# Patient Record
Sex: Female | Born: 1945 | Race: White | Hispanic: No | Marital: Single | Smoking: Former smoker
Health system: Southern US, Community
[De-identification: ages and names within clinical notes are randomized; demographics above are authoritative.]

## PROBLEM LIST (undated history)

## (undated) DIAGNOSIS — Z7189 Other specified counseling: Secondary | ICD-10-CM

## (undated) DIAGNOSIS — J449 Chronic obstructive pulmonary disease, unspecified: Secondary | ICD-10-CM

## (undated) DIAGNOSIS — M549 Dorsalgia, unspecified: Secondary | ICD-10-CM

## (undated) DIAGNOSIS — C801 Malignant (primary) neoplasm, unspecified: Secondary | ICD-10-CM

## (undated) DIAGNOSIS — Z5111 Encounter for antineoplastic chemotherapy: Secondary | ICD-10-CM

## (undated) DIAGNOSIS — Z9981 Dependence on supplemental oxygen: Secondary | ICD-10-CM

## (undated) HISTORY — DX: Other specified counseling: Z71.89

## (undated) HISTORY — PX: BACK SURGERY: SHX140

## (undated) HISTORY — DX: Dorsalgia, unspecified: M54.9

## (undated) HISTORY — DX: Encounter for antineoplastic chemotherapy: Z51.11

## (undated) HISTORY — PX: BREAST SURGERY: SHX581

---

## 1988-11-04 HISTORY — PX: ABDOMINAL HYSTERECTOMY: SHX81

## 2011-03-19 ENCOUNTER — Ambulatory Visit
Admission: RE | Admit: 2011-03-19 | Discharge: 2011-03-19 | Disposition: A | Discharge status: Home / Self Care | Payer: Medicare Other | Source: Ambulatory Visit | Attending: Orthopedic Surgery | Admitting: Orthopedic Surgery

## 2011-03-19 ENCOUNTER — Other Ambulatory Visit: Payer: Self-pay | Admitting: Orthopedic Surgery

## 2011-03-19 DIAGNOSIS — M25551 Pain in right hip: Secondary | ICD-10-CM

## 2011-04-03 ENCOUNTER — Other Ambulatory Visit: Payer: Self-pay | Admitting: Orthopedic Surgery

## 2011-04-03 DIAGNOSIS — M79604 Pain in right leg: Secondary | ICD-10-CM

## 2011-04-04 ENCOUNTER — Ambulatory Visit
Admission: RE | Admit: 2011-04-04 | Discharge: 2011-04-04 | Disposition: A | Discharge status: Home / Self Care | Payer: Medicare Other | Source: Ambulatory Visit | Attending: Orthopedic Surgery | Admitting: Orthopedic Surgery

## 2011-04-04 DIAGNOSIS — M79604 Pain in right leg: Secondary | ICD-10-CM

## 2011-04-30 ENCOUNTER — Other Ambulatory Visit: Payer: Self-pay | Admitting: Orthopedic Surgery

## 2011-04-30 ENCOUNTER — Ambulatory Visit (HOSPITAL_COMMUNITY)
Admission: RE | Admit: 2011-04-30 | Discharge: 2011-04-30 | Disposition: A | Discharge status: Home / Self Care | Payer: Medicare Other | Source: Ambulatory Visit | Attending: Orthopedic Surgery | Admitting: Orthopedic Surgery

## 2011-04-30 ENCOUNTER — Encounter (HOSPITAL_COMMUNITY): Payer: Medicare Other

## 2011-04-30 ENCOUNTER — Other Ambulatory Visit (HOSPITAL_COMMUNITY): Payer: Self-pay | Admitting: Orthopedic Surgery

## 2011-04-30 DIAGNOSIS — M51379 Other intervertebral disc degeneration, lumbosacral region without mention of lumbar back pain or lower extremity pain: Secondary | ICD-10-CM | POA: Insufficient documentation

## 2011-04-30 DIAGNOSIS — J449 Chronic obstructive pulmonary disease, unspecified: Secondary | ICD-10-CM | POA: Insufficient documentation

## 2011-04-30 DIAGNOSIS — M48061 Spinal stenosis, lumbar region without neurogenic claudication: Secondary | ICD-10-CM

## 2011-04-30 DIAGNOSIS — M216X9 Other acquired deformities of unspecified foot: Secondary | ICD-10-CM | POA: Insufficient documentation

## 2011-04-30 DIAGNOSIS — M79609 Pain in unspecified limb: Secondary | ICD-10-CM | POA: Insufficient documentation

## 2011-04-30 DIAGNOSIS — Z01818 Encounter for other preprocedural examination: Secondary | ICD-10-CM | POA: Insufficient documentation

## 2011-04-30 DIAGNOSIS — M545 Low back pain, unspecified: Secondary | ICD-10-CM | POA: Insufficient documentation

## 2011-04-30 DIAGNOSIS — I10 Essential (primary) hypertension: Secondary | ICD-10-CM | POA: Insufficient documentation

## 2011-04-30 DIAGNOSIS — Z01812 Encounter for preprocedural laboratory examination: Secondary | ICD-10-CM | POA: Insufficient documentation

## 2011-04-30 DIAGNOSIS — E669 Obesity, unspecified: Secondary | ICD-10-CM | POA: Insufficient documentation

## 2011-04-30 DIAGNOSIS — J4489 Other specified chronic obstructive pulmonary disease: Secondary | ICD-10-CM | POA: Insufficient documentation

## 2011-04-30 DIAGNOSIS — M4120 Other idiopathic scoliosis, site unspecified: Secondary | ICD-10-CM | POA: Insufficient documentation

## 2011-04-30 DIAGNOSIS — M5137 Other intervertebral disc degeneration, lumbosacral region: Secondary | ICD-10-CM | POA: Insufficient documentation

## 2011-04-30 LAB — DIFFERENTIAL
Basophils Absolute: 0 10*3/uL (ref 0.0–0.1)
Basophils Relative: 0 % (ref 0–1)
Eosinophils Absolute: 0.1 10*3/uL (ref 0.0–0.7)
Eosinophils Relative: 1 % (ref 0–5)
Neutrophils Relative %: 72 % (ref 43–77)

## 2011-04-30 LAB — PROTIME-INR
INR: 0.93 (ref 0.00–1.49)
Prothrombin Time: 12.7 seconds (ref 11.6–15.2)

## 2011-04-30 LAB — COMPREHENSIVE METABOLIC PANEL
AST: 10 U/L (ref 0–37)
Albumin: 3.6 g/dL (ref 3.5–5.2)
CO2: 27 mEq/L (ref 19–32)
Calcium: 10 mg/dL (ref 8.4–10.5)
Creatinine, Ser: 0.62 mg/dL (ref 0.50–1.10)
GFR calc non Af Amer: >60 mL/min (ref >60)
Sodium: 140 mEq/L (ref 135–145)
Total Protein: 7.1 g/dL (ref 6.0–8.3)

## 2011-04-30 LAB — URINALYSIS, ROUTINE W REFLEX MICROSCOPIC
Glucose, UA: NEGATIVE mg/dL
Ketones, ur: NEGATIVE mg/dL
Leukocytes, UA: NEGATIVE
Nitrite: NEGATIVE
Protein, ur: NEGATIVE mg/dL

## 2011-04-30 LAB — SURGICAL PCR SCREEN: Staphylococcus aureus: NEGATIVE

## 2011-04-30 LAB — CBC
Platelets: 327 10*3/uL (ref 150–400)
RBC: 5.54 MIL/uL — ABNORMAL HIGH (ref 3.87–5.11)
RDW: 14.2 % (ref 11.5–15.5)
WBC: 11.9 10*3/uL — ABNORMAL HIGH (ref 4.0–10.5)

## 2011-04-30 LAB — APTT: aPTT: 34 seconds (ref 24–37)

## 2011-04-30 LAB — ABO/RH: ABO/RH(D): O POS

## 2011-05-07 ENCOUNTER — Ambulatory Visit (HOSPITAL_COMMUNITY): Payer: Medicare Other

## 2011-05-07 ENCOUNTER — Inpatient Hospital Stay (HOSPITAL_COMMUNITY)
Admission: RE | Admit: 2011-05-07 | Discharge: 2011-05-11 | DRG: 491 | Disposition: A | Discharge status: Home Health | Payer: Medicare Other | Source: Ambulatory Visit | Attending: Orthopedic Surgery | Admitting: Orthopedic Surgery

## 2011-05-07 DIAGNOSIS — I10 Essential (primary) hypertension: Secondary | ICD-10-CM | POA: Diagnosis present

## 2011-05-07 DIAGNOSIS — Z01812 Encounter for preprocedural laboratory examination: Secondary | ICD-10-CM

## 2011-05-07 DIAGNOSIS — M48061 Spinal stenosis, lumbar region without neurogenic claudication: Principal | ICD-10-CM | POA: Diagnosis present

## 2011-05-07 DIAGNOSIS — M216X9 Other acquired deformities of unspecified foot: Secondary | ICD-10-CM | POA: Diagnosis present

## 2011-05-07 DIAGNOSIS — E669 Obesity, unspecified: Secondary | ICD-10-CM | POA: Diagnosis present

## 2011-05-07 DIAGNOSIS — G510 Bell's palsy: Secondary | ICD-10-CM | POA: Diagnosis present

## 2011-05-07 DIAGNOSIS — J449 Chronic obstructive pulmonary disease, unspecified: Secondary | ICD-10-CM | POA: Diagnosis present

## 2011-05-07 DIAGNOSIS — J4489 Other specified chronic obstructive pulmonary disease: Secondary | ICD-10-CM | POA: Diagnosis present

## 2011-05-07 LAB — TYPE AND SCREEN: ABO/RH(D): O POS

## 2011-05-09 LAB — HEMOGLOBIN AND HEMATOCRIT, BLOOD: HCT: 38.3 % (ref 36.0–46.0)

## 2011-05-11 NOTE — Op Note (Signed)
[name], [name]                 ACCOUNT NO.:  [id number]  MEDICAL RECORD NO.:  [id number]  LOCATION:  1615                         FACILITY:  [facility]  PHYSICIAN:  Georges Lynch. Jawanna Dykman, M.D.DATE OF BIRTH:  April 25, 1946  DATE OF PROCEDURE:  05/07/2011 DATE OF DISCHARGE:                              OPERATIVE REPORT   SURGEON:  Georges Lynch. Darrelyn Hillock, M.D.  ASSISTANT:  Marlowe Kays, M.D.  PREOPERATIVE DIAGNOSES: 1. Severe spinal stenosis at L3-4 with foraminal stenosis at L3-4     bilaterally. 2. Partial footdrop on the right.  Note, she had all right leg pain     prior to surgery.  POSTOPERATIVE DIAGNOSES: 1. Severe spinal stenosis at L3-4 with foraminal stenosis at L3-4     bilaterally. 2. Partial footdrop on the right.  Note, she had all right leg pain     prior to surgery.  OPERATION: 1. Complete decompressive lumbar laminectomy at L3-4 for spinal     stenosis. 2. Bilateral foraminotomies at L4 and the L3 roots bilaterally.  PROCEDURE:  Under general anesthesia, routine orthopedic prep and drape of the lower back was carried out.  She had 2 g of IV Ancef preop.  At this time because of the bilateral total knee, she was placed in the prone position on the Wilson frame.  Sterile prep and drape was carried out.  The appropriate time-out was carried out.  Note also I marked the right side of her back as the symptoms were on the right and footdrop was on the right and I did that in the holding area.  After sterile prepping and draping and time-out was carried out, two needles were placed on the back for localization purposes.  X-ray was taken to verify the position.  At this time, we identified the L3-4 space.  An incision was made over the L3-4 area.  Bleeders identified and cauterized.  Note she had a significant amount of adipose tissue.  This was more complex because of her body size.  Following that, the self-retaining retractors were inserted.  Bleeders identified and  cauterized.  I then incised the lumbodorsal fascia and separated the muscle from the lamina of spinous process bilaterally.  Once again good hemostasis was maintained.  We did take a great deal of time to control the small amount of bleeding that she had.  At this time, 2 Kocher clamps were placed on the spinous processes, another x-ray was taken.  Following that, the self-retaining McCullough retractors were inserted.  Once again attention was directed to the small amount of bleeding that we had from the soft tissue.  At this time we then removed the spinous process of L3 and a portion of spinous process distal and proximal and went down and did a nice decompressive lumbar laminectomy and a microscope to highlight that was used.  At this particular time, we then identified the L3-4 space with microscope brought in.  We then began our decompression with our Kerrison rongeurs.  The dura was protected at all times.  Note she had a severe constriction and stenosis at this L3-4 level.  It was all due to marked overgrowth of bone and ligamentum flavum.  We dissected proximally and distally until we were able to easily pass a hockey-stick proximally and distally which indicated that the canal now was wide open.  We then gently retracted the dura with a D'Errico retractor and out laterally and removed the thickened ligamentum flavum.  Once this was done, the dura now was totally free, we were able to easily pass a hockey-stick out the foramen and freed up the root 3 and root 4. Following that, we then thoroughly irrigated out the area and x-ray was taken with instruments in the area that we needed to be.  Following that we then injected 10 cc of FloSeal and held that in place for about 30 seconds or so with a saturated sponge.  At this time, I then removed the FloSeal from the dura and into surrounding soft tissue and bony structures and loosely applied some thrombin-soaked Gelfoam and  closed the wound in layers in usual fashion.  The small distal deep and proximal part of the wound was left open for drainage purposes.  The subcu was closed with 0 Vicryl, skin with metal staples.  A sterile Neosporin dressing was applied.  The patient left the operating room in satisfactory condition.          ______________________________ Georges Lynch Darrelyn Hillock, M.D.     RAG/MEDQ  D:  05/07/2011  T:  05/07/2011  Job:  [id number]  cc:   Ollen Gross, M.D. Fax: [phone]  Georgianne Fick, M.D. Fax: [phone]  Electronically Signed by Ranee Gosselin M.D. on 05/11/2011 08:20:08 AM

## 2011-05-11 NOTE — Discharge Summary (Signed)
  Donna, [name]                 ACCOUNT NO.:  [id number]  MEDICAL RECORD NO.:  [id number]  LOCATION:  1615                         FACILITY:  [facility]  PHYSICIAN:  [name]. [name], M.D.DATE OF BIRTH:  08-22-1946  DATE OF ADMISSION:  05/07/2011 DATE OF DISCHARGE:                              DISCHARGE SUMMARY   [name] was admitted to the hospital, taken to surgery by me on May 07, 2011, at which time I did a complete decompressive lumbar laminectomy at L3-4 for spinal stenosis, re-explored 2 roots, the L4 roots and the L3 roots bilaterally.  She had rather severe spinal stenosis.  Postop day #1 which was May 08, 2011, she was doing well.  She was moving her legs well.  She was gradually weaned off her oxygen.  She remained stable.  Day #2 on May 09, 2011, she was much better but she still was a little reluctant to walk because of the steps that she has to climb at home and I did mention to her we will do step training for her beforeshe leaves.  She has 2 flights of stairs to climb at home.  Otherwise, her dressing was changed and wound looked fine.  On May 10, 2011, she was seen by me.  She is afebrile, moves her legs well.  She is up ambulating much better with a walker and we are planning on discharging her tomorrow.  PERTINENT LABORATORY STUDIES:  Her chest x-ray was fine.  Her back x-ray was all listed in the chart.  EKG was within normal limits.  PERTINENT LAB STUDIES:  The surgical screening or nasal cultures were negative for MRSA, negative for staph.  Her platelet count was 327.  Her white count 11,900, hemoglobin 15, hematocrit 47, normal differential. The sodium 140, potassium 3.8, chloride 101, glucose 101, BUN 9, creatinine 0.62, calcium was 10.  Her SGOT, SGPT were normal.  Alkaline phosphatase 94.  Her INR was 0.93.  Prothrombin time 12.7, PTT was normal at 34.  Urinalysis was negative.  FINAL DISCHARGE DIAGNOSIS:  Severe spinal stenosis as I mentioned  above at L3-4 with foraminal stenosis of the L3 and L4 roots.  Note the she had a partial footdrop prior to surgery on the right and that has been relieved postop.  DISCHARGE CONDITION:  Improved.  DISCHARGE DIET:  She will go on her preop diet.  DISCHARGE MEDICATIONS:  Were all listed in the discharge manager that I typed up.  DISCHARGE INSTRUCTIONS:  She will ambulate with a walker, full weightbearing.  She may take a shower as I explained to her this morning and she will see me in the office in 2 weeks or prior to that if there are any problems.  Further discharge medications were written.  She is going to be on Robaxin 500 mg t.i.d. for spasms.  She will be on Percocet 10/650 one every 4 hours p.r.n. for pain.          ______________________________ [name]. [name], M.D.     RAG/MEDQ  D:  05/10/2011  T:  05/10/2011  Job:  [id number]  Electronically Signed by [name] M.D. on 05/11/2011 08:20:10 AM

## 2013-12-03 ENCOUNTER — Emergency Department (HOSPITAL_COMMUNITY): Payer: Medicare Other

## 2013-12-03 ENCOUNTER — Encounter (HOSPITAL_COMMUNITY): Payer: Self-pay | Admitting: Emergency Medicine

## 2013-12-03 ENCOUNTER — Inpatient Hospital Stay (HOSPITAL_COMMUNITY)
Admission: EM | Admit: 2013-12-03 | Discharge: 2013-12-08 | DRG: 190 | Disposition: A | Discharge status: Home / Self Care | Payer: Medicare Other | Attending: Internal Medicine | Admitting: Internal Medicine

## 2013-12-03 DIAGNOSIS — J449 Chronic obstructive pulmonary disease, unspecified: Secondary | ICD-10-CM | POA: Diagnosis present

## 2013-12-03 DIAGNOSIS — Z6837 Body mass index (BMI) 37.0-37.9, adult: Secondary | ICD-10-CM

## 2013-12-03 DIAGNOSIS — E785 Hyperlipidemia, unspecified: Secondary | ICD-10-CM

## 2013-12-03 DIAGNOSIS — Z713 Dietary counseling and surveillance: Secondary | ICD-10-CM

## 2013-12-03 DIAGNOSIS — B370 Candidal stomatitis: Secondary | ICD-10-CM | POA: Diagnosis present

## 2013-12-03 DIAGNOSIS — J441 Chronic obstructive pulmonary disease with (acute) exacerbation: Principal | ICD-10-CM | POA: Diagnosis present

## 2013-12-03 DIAGNOSIS — R062 Wheezing: Secondary | ICD-10-CM | POA: Diagnosis present

## 2013-12-03 DIAGNOSIS — J9600 Acute respiratory failure, unspecified whether with hypoxia or hypercapnia: Secondary | ICD-10-CM | POA: Diagnosis not present

## 2013-12-03 DIAGNOSIS — F17200 Nicotine dependence, unspecified, uncomplicated: Secondary | ICD-10-CM | POA: Diagnosis present

## 2013-12-03 DIAGNOSIS — M1990 Unspecified osteoarthritis, unspecified site: Secondary | ICD-10-CM

## 2013-12-03 DIAGNOSIS — Z853 Personal history of malignant neoplasm of breast: Secondary | ICD-10-CM

## 2013-12-03 DIAGNOSIS — K219 Gastro-esophageal reflux disease without esophagitis: Secondary | ICD-10-CM | POA: Diagnosis present

## 2013-12-03 DIAGNOSIS — J9601 Acute respiratory failure with hypoxia: Secondary | ICD-10-CM

## 2013-12-03 DIAGNOSIS — E669 Obesity, unspecified: Secondary | ICD-10-CM | POA: Diagnosis present

## 2013-12-03 DIAGNOSIS — I10 Essential (primary) hypertension: Secondary | ICD-10-CM | POA: Diagnosis present

## 2013-12-03 DIAGNOSIS — R7309 Other abnormal glucose: Secondary | ICD-10-CM | POA: Diagnosis present

## 2013-12-03 DIAGNOSIS — Z720 Tobacco use: Secondary | ICD-10-CM | POA: Diagnosis present

## 2013-12-03 HISTORY — DX: Chronic obstructive pulmonary disease, unspecified: J44.9

## 2013-12-03 HISTORY — DX: Malignant (primary) neoplasm, unspecified: C80.1

## 2013-12-03 LAB — CBC
HCT: 52.4 % — ABNORMAL HIGH (ref 36.0–46.0)
HEMOGLOBIN: 16.8 g/dL — AB (ref 12.0–15.0)
MCH: 27.1 pg (ref 26.0–34.0)
MCHC: 32.1 g/dL (ref 30.0–36.0)
MCV: 84.5 fL (ref 78.0–100.0)
PLATELETS: 303 10*3/uL (ref 150–400)
RBC: 6.2 MIL/uL — ABNORMAL HIGH (ref 3.87–5.11)
RDW: 14.9 % (ref 11.5–15.5)
WBC: 9.2 10*3/uL (ref 4.0–10.5)

## 2013-12-03 LAB — BASIC METABOLIC PANEL
BUN: 10 mg/dL (ref 6–23)
CALCIUM: 9.3 mg/dL (ref 8.4–10.5)
CO2: 30 mEq/L (ref 19–32)
Chloride: 96 mEq/L (ref 96–112)
Creatinine, Ser: 0.58 mg/dL (ref 0.50–1.10)
Glucose, Bld: 126 mg/dL — ABNORMAL HIGH (ref 70–99)
POTASSIUM: 3.8 meq/L (ref 3.7–5.3)
Sodium: 138 mEq/L (ref 137–147)

## 2013-12-03 LAB — POCT I-STAT TROPONIN I: TROPONIN I, POC: 0 ng/mL (ref 0.00–0.08)

## 2013-12-03 LAB — HEMOGLOBIN A1C
Hgb A1c MFr Bld: 6.4 % — ABNORMAL HIGH (ref <5.7)
Mean Plasma Glucose: 137 mg/dL — ABNORMAL HIGH (ref <117)

## 2013-12-03 LAB — PRO B NATRIURETIC PEPTIDE: PRO B NATRI PEPTIDE: 55.2 pg/mL (ref 0–125)

## 2013-12-03 MED ORDER — SODIUM CHLORIDE 0.9 % IV BOLUS (SEPSIS)
500.0000 mL | Freq: Once | INTRAVENOUS | Status: AC
  Administered 2013-12-03: 500 mL via INTRAVENOUS

## 2013-12-03 MED ORDER — ALBUTEROL SULFATE (2.5 MG/3ML) 0.083% IN NEBU: 2.5000 mg | INHALATION_SOLUTION | RESPIRATORY_TRACT | Status: AC | PRN

## 2013-12-03 MED ORDER — OXYCODONE-ACETAMINOPHEN 10-325 MG PO TABS: 1.0000 | ORAL_TABLET | Freq: Four times a day (QID) | ORAL | Status: DC | PRN

## 2013-12-03 MED ORDER — CELECOXIB 200 MG PO CAPS
200.0000 mg | ORAL_CAPSULE | Freq: Two times a day (BID) | ORAL | Status: DC | PRN
  Filled 2013-12-03: qty 1

## 2013-12-03 MED ORDER — OSELTAMIVIR PHOSPHATE 75 MG PO CAPS
75.0000 mg | ORAL_CAPSULE | Freq: Two times a day (BID) | ORAL | Status: DC
  Administered 2013-12-03 – 2013-12-04 (×2): 75 mg via ORAL
  Filled 2013-12-03 (×3): qty 1

## 2013-12-03 MED ORDER — METHYLPREDNISOLONE SODIUM SUCC 125 MG IJ SOLR
125.0000 mg | Freq: Two times a day (BID) | INTRAMUSCULAR | Status: DC
  Filled 2013-12-03: qty 2

## 2013-12-03 MED ORDER — ALBUTEROL (5 MG/ML) CONTINUOUS INHALATION SOLN
10.0000 mg/h | INHALATION_SOLUTION | Freq: Once | RESPIRATORY_TRACT | Status: AC
  Administered 2013-12-03: 10 mg/h via RESPIRATORY_TRACT
  Filled 2013-12-03: qty 20

## 2013-12-03 MED ORDER — ENOXAPARIN SODIUM 40 MG/0.4ML ~~LOC~~ SOLN
40.0000 mg | SUBCUTANEOUS | Status: DC
  Administered 2013-12-03 – 2013-12-07 (×5): 40 mg via SUBCUTANEOUS
  Filled 2013-12-03 (×6): qty 0.4

## 2013-12-03 MED ORDER — NICOTINE 21 MG/24HR TD PT24
21.0000 mg | MEDICATED_PATCH | Freq: Every day | TRANSDERMAL | Status: DC
Start: 2013-12-03 — End: 2013-12-08
  Administered 2013-12-04 – 2013-12-08 (×5): 21 mg via TRANSDERMAL
  Filled 2013-12-03 (×5): qty 1

## 2013-12-03 MED ORDER — PREGABALIN 75 MG PO CAPS
200.0000 mg | ORAL_CAPSULE | Freq: Two times a day (BID) | ORAL | Status: DC
  Administered 2013-12-03 – 2013-12-08 (×9): 200 mg via ORAL
  Filled 2013-12-03 (×2): qty 2
  Filled 2013-12-03 (×6): qty 1
  Filled 2013-12-03 (×2): qty 2
  Filled 2013-12-03: qty 1
  Filled 2013-12-03 (×2): qty 2
  Filled 2013-12-03: qty 1
  Filled 2013-12-03 (×2): qty 2
  Filled 2013-12-03: qty 1
  Filled 2013-12-03 (×2): qty 2
  Filled 2013-12-03: qty 1

## 2013-12-03 MED ORDER — IPRATROPIUM BROMIDE 0.02 % IN SOLN
0.5000 mg | Freq: Once | RESPIRATORY_TRACT | Status: AC
  Administered 2013-12-03: 0.5 mg via RESPIRATORY_TRACT
  Filled 2013-12-03: qty 2.5

## 2013-12-03 MED ORDER — ATORVASTATIN CALCIUM 40 MG PO TABS
40.0000 mg | ORAL_TABLET | Freq: Every day | ORAL | Status: DC
  Administered 2013-12-03 – 2013-12-07 (×5): 40 mg via ORAL
  Filled 2013-12-03 (×6): qty 1

## 2013-12-03 MED ORDER — LEVOFLOXACIN IN D5W 500 MG/100ML IV SOLN: 500.0000 mg | INTRAVENOUS | Status: DC

## 2013-12-03 MED ORDER — FLUTICASONE PROPIONATE 50 MCG/ACT NA SUSP
2.0000 | Freq: Every day | NASAL | Status: DC
  Administered 2013-12-03 – 2013-12-08 (×6): 2 via NASAL
  Filled 2013-12-03: qty 16

## 2013-12-03 MED ORDER — LEVOFLOXACIN IN D5W 500 MG/100ML IV SOLN
500.0000 mg | INTRAVENOUS | Status: DC
Start: 2013-12-03 — End: 2013-12-07
  Administered 2013-12-03 – 2013-12-06 (×4): 500 mg via INTRAVENOUS
  Filled 2013-12-03 (×4): qty 100

## 2013-12-03 MED ORDER — OXYCODONE HCL 5 MG PO TABS: 5.0000 mg | ORAL_TABLET | Freq: Four times a day (QID) | ORAL | Status: DC | PRN

## 2013-12-03 MED ORDER — IPRATROPIUM-ALBUTEROL 0.5-2.5 (3) MG/3ML IN SOLN
3.0000 mL | RESPIRATORY_TRACT | Status: DC
Start: 2013-12-03 — End: 2013-12-04
  Administered 2013-12-03 – 2013-12-04 (×2): 3 mL via RESPIRATORY_TRACT
  Filled 2013-12-03 (×2): qty 3

## 2013-12-03 MED ORDER — METHYLPREDNISOLONE SODIUM SUCC 125 MG IJ SOLR
125.0000 mg | Freq: Two times a day (BID) | INTRAMUSCULAR | Status: DC
  Administered 2013-12-04 – 2013-12-08 (×8): 125 mg via INTRAVENOUS
  Filled 2013-12-03 (×10): qty 2

## 2013-12-03 MED ORDER — METHYLPREDNISOLONE SODIUM SUCC 125 MG IJ SOLR
125.0000 mg | Freq: Once | INTRAMUSCULAR | Status: AC
  Administered 2013-12-03: 125 mg via INTRAVENOUS
  Filled 2013-12-03: qty 2

## 2013-12-03 MED ORDER — CYCLOBENZAPRINE HCL 10 MG PO TABS
10.0000 mg | ORAL_TABLET | Freq: Two times a day (BID) | ORAL | Status: DC | PRN
  Filled 2013-12-03: qty 1

## 2013-12-03 MED ORDER — PANTOPRAZOLE SODIUM 40 MG PO TBEC
80.0000 mg | DELAYED_RELEASE_TABLET | Freq: Every day | ORAL | Status: DC
  Administered 2013-12-04 – 2013-12-08 (×5): 80 mg via ORAL
  Filled 2013-12-03 (×6): qty 2

## 2013-12-03 MED ORDER — OXYCODONE-ACETAMINOPHEN 5-325 MG PO TABS: 1.0000 | ORAL_TABLET | Freq: Four times a day (QID) | ORAL | Status: DC | PRN

## 2013-12-03 NOTE — Progress Notes (Signed)
   CARE MANAGEMENT ED NOTE 12/03/2013  Patient:  [name], [name]   Account Number:  [id number]  Date Initiated:  12/03/2013  Documentation initiated by:  Jackelyn Poling  Subjective/Objective Assessment:   68 yr old [employer] pt who states dr Merrilee Seashore is pcp     Subjective/Objective Assessment Detail:     Action/Plan:   EPIC updated   Action/Plan Detail:   Anticipated DC Date:       Status Recommendation to Physician:   Result of Recommendation:    Other ED [place]  Other  PCP issues  Other    Choice offered to / List presented to:            Status of service:  Completed, signed off  ED Comments:   ED Comments Detail:

## 2013-12-03 NOTE — H&P (Signed)
History and Physical    [name] HAL:[id number] DOB: Jun 28, 1946 DOA: 12/03/2013  Referring physician: Dr. Tomi Bamberger PCP: Merrilee Seashore, MD  Specialists: none  Chief Complaint: SOB  HPI: [name] is a 68 y.o. female has a past medical history significant for COPD, tobacco abuse (2.5 ppd), HTN, HLD, obesity, presents to the ED with a chief complaint of shortness of breath. She has been having upper respiratory symptoms for the past 3-4 days, and went to see her PCP yesterday. She was given an antibiotic, antiviral, and some medications for symptom management, however overnight she was feeling progressively worse and she decided to come to the emergency room today. She endorses flulike symptoms, but denies frank fever or chills. She endorses productive cough with sputum production, and she has been having a hard time expectorating, states that the sputum feels "like a brick". She denies any abdominal pain, nausea vomiting or diarrhea. She denies any chest pain. She denies any lightheadedness or dizziness. This has never happened to her in the past. She rarely needs prednisone for mild exacerbations of her COPD, and this is usually manages an outpatient and never needed to be hospitalized for this. In the emergency room, patient with oxygen requirements and difficult to control her wheezing and respiratory symptoms, [facility] has been asked to admit. Blood work was normal renal function, no leukocytosis, chest x-ray with no evidence of pneumonia.  Review of Systems: As per history of present illness, otherwise negative.  Past Medical History  Diagnosis Date  . COPD (chronic obstructive pulmonary disease)   . Cancer     right breast cancer   Past Surgical History  Procedure Laterality Date  . Back surgery     Social History:  reports that she has been smoking Cigarettes.  She has been smoking about 3.00 packs per day. She does not have any smokeless tobacco history on file. She reports that she  does not drink alcohol. Her drug history is not on file.  No Known Allergies  Family history noncontributory  Prior to Admission medications   Medication Sig Start Date End Date Taking? Authorizing Provider  albuterol (PROVENTIL HFA;VENTOLIN HFA) 108 (90 BASE) MCG/ACT inhaler Inhale 2 puffs into the lungs daily as needed for wheezing or shortness of breath.   Yes Historical Provider, MD  atorvastatin (LIPITOR) 40 MG tablet Take 40 mg by mouth daily.   Yes Historical Provider, MD  cefUROXime (CEFTIN) 250 MG tablet Take 250 mg by mouth every 12 (twelve) hours. For 10 days 12/02/13  Yes Historical Provider, MD  celecoxib (CELEBREX) 200 MG capsule Take 200 mg by mouth 2 (two) times daily.   Yes Historical Provider, MD  chlorpheniramine-phenylephrine (RYNATAN) 9-25 MG TABS Take 1 tablet by mouth every 12 (twelve) hours as needed.   Yes Historical Provider, MD  cyclobenzaprine (FLEXERIL) 10 MG tablet Take 10 mg by mouth 2 (two) times daily as needed for muscle spasms.   Yes Historical Provider, MD  esomeprazole (NEXIUM) 40 MG capsule Take 40 mg by mouth daily as needed (acid reflex).   Yes Historical Provider, MD  fluticasone (FLONASE) 50 MCG/ACT nasal spray Place 2 sprays into both nostrils daily.   Yes Historical Provider, MD  Ipratropium-Albuterol (COMBIVENT RESPIMAT) 20-100 MCG/ACT AERS respimat Inhale 1 puff into the lungs 3 (three) times daily.   Yes Historical Provider, MD  irbesartan-hydrochlorothiazide (AVALIDE) 150-12.5 MG per tablet Take 2 tablets by mouth daily. For blood pressure   Yes Historical Provider, MD  oseltamivir (TAMIFLU) 75 MG capsule  Take 75 mg by mouth 2 (two) times daily. For 5 days 12/02/13  Yes Historical Provider, MD  oxyCODONE-acetaminophen (PERCOCET) 10-325 MG per tablet Take 1 tablet by mouth every 6 (six) hours as needed for pain.   Yes Historical Provider, MD  pregabalin (LYRICA) 200 MG capsule Take 200 mg by mouth 2 (two) times daily.   Yes Historical Provider, MD    Physical Exam: Filed Vitals:   12/03/13 1445 12/03/13 1500 12/03/13 1633 12/03/13 1645  BP:   96/43   Pulse: 103 104  101  Temp:      TempSrc:      Resp: 17 21  18   SpO2: 89% 99%  95%     General:  Sitting upright, breathing relatively comfortable, speaking in full sentences.  Eyes: no scleral icterus  ENT: moist oropharynx  Neck: supple, no JVD  Cardiovascular: regular rate without MRG; 2+ peripheral pulses  Respiratory: Decreased breath sounds throughout, diffuse expiratory wheezing present  Abdomen: Obese, soft, non tender to palpation, positive bowel sounds, no guarding, no rebound  Skin: no rashes  Musculoskeletal: no peripheral edema  Psychiatric: normal mood and affect  Neurologic: Grossly nonfocal  Labs on Admission:  Basic Metabolic Panel:  Recent Labs Lab 12/03/13 1415  NA 138  K 3.8  CL 96  CO2 30  GLUCOSE 126*  BUN 10  CREATININE 0.58  CALCIUM 9.3   CBC:  Recent Labs Lab 12/03/13 1415  WBC 9.2  HGB 16.8*  HCT 52.4*  MCV 84.5  PLT 303   BNP (last 3 results)  Recent Labs  12/03/13 1415  PROBNP 55.2   Radiological Exams on Admission: Dg Chest 2 View  12/03/2013   CLINICAL DATA:  Shortness of breath.  Emphysema.  EXAM: CHEST  2 VIEW  COMPARISON:  None.  FINDINGS: The lungs are hyperinflated with flattening of the diaphragm consistent with emphysema. No acute infiltrates or effusions. Heart size and pulmonary vascularity are normal. No significant osseous abnormality.  IMPRESSION: Emphysema.  No acute abnormalities.   Electronically Signed   By: Rozetta Nunnery M.D.   On: 12/03/2013 14:07    EKG: Independently reviewed. Sinus tachycardia.  Assessment/Plan Principal Problem:   COPD exacerbation Active Problems:   HTN (hypertension)   HLD (hyperlipidemia)   Obesity   Tobacco abuse   GERD (gastroesophageal reflux disease)   OA (osteoarthritis)   COPD exacerbation - with productive sputum production, we'll start empiric  levofloxacin. Check influenza and start Tamiflu, discontinued negative. Duonebs every 4 hours as needed, IV steroids.  Tobacco abuse - she unfortunately continues to smoke about 2-1/2 packs a day. We'll provide nicotine patch. Hypertension - hold antihypertensives As blood pressure normal the low side in the ED GERD - continue PPI Hyperlipidemia - continue statin Obesity - no history of diabetes, but check an A1c especially given the fact that she will get steroids. Morning Accu-Cheks. Osteoarthritis - continue home pain regimen.  Diet: regular Fluids: NS DVT Prophylaxis: Lovenox  Code Status: Full  Family Communication: none  Disposition Plan: inpatient  Time spent: 68  Costin M. Cruzita Lederer, MD [facility] Pager [phone]  If 7PM-7AM, please contact night-coverage [email or web address] Password [email or web address] 12/03/2013, 5:06 PM

## 2013-12-03 NOTE — ED Notes (Signed)
Per EMS, pt was seen at [place] office today for followup on bloodwork and her COPD. Pt states she has had shortness of breath since last night. Pt's O2 sats were in the 80s on RA while at doctor. Pt also complains of congestion and wheezing. Pt was given 5mg  albuterol en route to hospital, which pt states has helped. Pt was 94% on 2L during ride to hospital. Pt denies chest pain, nausea, vomiting.

## 2013-12-03 NOTE — ED Notes (Signed)
Bed: WA17 Expected date:  Expected time:  Means of arrival:  Comments: EMS-SOB

## 2013-12-03 NOTE — ED Provider Notes (Signed)
CSN: [id number]     Arrival date & time 12/03/13  1251 History   First MD Initiated Contact with Patient 12/03/13 1309     Chief Complaint  Patient presents with  . Shortness of Breath    HPI Comments: Pt has history of COPD.  She was seen at her doctor's office yesterday for flu like symptoms.  She was started on ceftin, tamiflu, decongest and medications and flonase. Pt started some of the medications and felt much worse.    Patient is a 68 y.o. female presenting with shortness of breath. The history is provided by the patient.  Shortness of Breath Severity:  Severe Onset quality:  Gradual Duration:  2 days Timing:  Constant Progression:  Worsening Chronicity:  Recurrent Context: URI   Relieved by:  Nothing Worsened by:  Activity and exertion Ineffective treatments: inhalers helped to alleviate somewhat but she is still not breathing as easily as normal. Associated symptoms: cough and sputum production   Associated symptoms: no abdominal pain, no chest pain, no fever, no rash and no vomiting    patient went back to her doctor's office today. She was noted to have oxygen saturations in the 80s on room air. The patient was transported by EMS to the hospital. She was given 5 mg of albuterol in route. Patient does continue to smoke cigarettes unfortunately History reviewed. No pertinent past medical history. Past Surgical History  Procedure Laterality Date  . Back surgery     No family history on file. History  Substance Use Topics  . Smoking status: Current Every Day Smoker -- 3.00 packs/day    Types: Cigarettes  . Smokeless tobacco: Not on file  . Alcohol Use: No   OB History   Grav Para Term Preterm Abortions TAB SAB Ect Mult Living                 Review of Systems  Constitutional: Negative for fever.  Respiratory: Positive for cough, sputum production and shortness of breath.   Cardiovascular: Negative for chest pain.  Gastrointestinal: Negative for vomiting and  abdominal pain.  Skin: Negative for rash.  All other systems reviewed and are negative.    Allergies  Review of patient's allergies indicates no known allergies.  Home Medications  No current outpatient prescriptions on file. BP 166/73  Pulse 104  Temp(Src) 98.1 F (36.7 C) (Oral)  Resp 21  SpO2 99% Physical Exam  Nursing note and vitals reviewed. Constitutional: She appears well-developed and well-nourished. No distress.  Obese  HENT:  Head: Normocephalic and atraumatic.  Right Ear: External ear normal.  Left Ear: External ear normal.  Mouth/Throat: No oropharyngeal exudate.  Eyes: Conjunctivae are normal. Right eye exhibits no discharge. Left eye exhibits no discharge. No scleral icterus.  Neck: Neck supple. No tracheal deviation present.  Cardiovascular: Normal rate, regular rhythm and intact distal pulses.   Pulmonary/Chest: Effort normal. No stridor. No respiratory distress. She has decreased breath sounds. She has no wheezes. She has no rales.  No wheezing or rales auscultated but severely diminished breath sounds bilaterally  Abdominal: Soft. Bowel sounds are normal. She exhibits no distension. There is no tenderness. There is no rebound and no guarding.  Musculoskeletal: She exhibits no edema and no tenderness.  Neurological: She is alert. She has normal strength. No cranial nerve deficit (no facial droop, extraocular movements intact, no slurred speech) or sensory deficit. She exhibits normal muscle tone. She displays no seizure activity. Coordination normal.  Skin: Skin is warm and  dry. No rash noted.  Psychiatric: She has a normal mood and affect.    ED Course  Procedures (including critical care time) Labs Review Labs Reviewed  BASIC METABOLIC PANEL - Abnormal; Notable for the following:    Glucose, Bld 126 (*)    All other components within normal limits  CBC - Abnormal; Notable for the following:    RBC 6.20 (*)    Hemoglobin 16.8 (*)    HCT 52.4 (*)     All other components within normal limits  PRO B NATRIURETIC PEPTIDE  POCT I-STAT TROPONIN I   Imaging Review Dg Chest 2 View  12/03/2013   CLINICAL DATA:  Shortness of breath.  Emphysema.  EXAM: CHEST  2 VIEW  COMPARISON:  None.  FINDINGS: The lungs are hyperinflated with flattening of the diaphragm consistent with emphysema. No acute infiltrates or effusions. Heart size and pulmonary vascularity are normal. No significant osseous abnormality.  IMPRESSION: Emphysema.  No acute abnormalities.   Electronically Signed   By: Rozetta Nunnery M.D.   On: 12/03/2013 14:07    EKG Interpretation    Date/Time:  Friday December 03 2013 14:03:13 EST Ventricular Rate:  95 PR Interval:  144 QRS Duration: 100 QT Interval:  377 QTC Calculation: 474 R Axis:   59 Text Interpretation:  Sinus rhythm Low voltage, precordial leads Probable anteroseptal infarct, old No previous tracing Confirmed by Ricardo Kayes  MD-J, [employer] Cahoon ([id number]) on 12/03/2013 2:09:30 PM           Medications  methylPREDNISolone sodium succinate (SOLU-MEDROL) 125 mg/2 mL injection 125 mg (125 mg Intravenous Given 12/03/13 1506)  albuterol (PROVENTIL,VENTOLIN) solution continuous neb (10 mg/hr Nebulization Given 12/03/13 1351)  ipratropium (ATROVENT) nebulizer solution 0.5 mg (0.5 mg Nebulization Given 12/03/13 1351)    MDM   1. COPD with exacerbation    Pt feeling better but still requiring o2 to maintain saturations.  Will consult with hospitalist regarding admission for COPD exacerbation.  No PNA.  Doubt CHF.   Kathalene Frames, MD 12/03/13 [phone]

## 2013-12-03 NOTE — Progress Notes (Signed)
Utilization Review completed.  Skylar Flynt RN CM  

## 2013-12-04 DIAGNOSIS — F17200 Nicotine dependence, unspecified, uncomplicated: Secondary | ICD-10-CM

## 2013-12-04 DIAGNOSIS — I10 Essential (primary) hypertension: Secondary | ICD-10-CM

## 2013-12-04 DIAGNOSIS — K219 Gastro-esophageal reflux disease without esophagitis: Secondary | ICD-10-CM

## 2013-12-04 LAB — INFLUENZA PANEL BY PCR (TYPE A & B)
H1N1 flu by pcr: NOT DETECTED
Influenza A By PCR: NEGATIVE
Influenza B By PCR: NEGATIVE

## 2013-12-04 LAB — GLUCOSE, CAPILLARY: GLUCOSE-CAPILLARY: 131 mg/dL — AB (ref 70–99)

## 2013-12-04 MED ORDER — IPRATROPIUM-ALBUTEROL 0.5-2.5 (3) MG/3ML IN SOLN
3.0000 mL | Freq: Four times a day (QID) | RESPIRATORY_TRACT | Status: DC
Start: 2013-12-04 — End: 2013-12-05
  Administered 2013-12-04 (×4): 3 mL via RESPIRATORY_TRACT
  Filled 2013-12-04 (×4): qty 3

## 2013-12-04 MED ORDER — ROPINIROLE HCL 1 MG PO TABS
1.0000 mg | ORAL_TABLET | Freq: Every evening | ORAL | Status: DC | PRN
  Administered 2013-12-04 – 2013-12-07 (×3): 1 mg via ORAL
  Filled 2013-12-04 (×3): qty 1

## 2013-12-04 MED ORDER — IRBESARTAN 300 MG PO TABS
300.0000 mg | ORAL_TABLET | Freq: Every day | ORAL | Status: DC
  Administered 2013-12-04 – 2013-12-08 (×5): 300 mg via ORAL
  Filled 2013-12-04 (×6): qty 1

## 2013-12-04 MED ORDER — IRBESARTAN-HYDROCHLOROTHIAZIDE 150-12.5 MG PO TABS
2.0000 | ORAL_TABLET | Freq: Every day | ORAL | Status: DC
Start: 2013-12-04 — End: 2013-12-04

## 2013-12-04 MED ORDER — HYDROCHLOROTHIAZIDE 25 MG PO TABS
25.0000 mg | ORAL_TABLET | Freq: Every day | ORAL | Status: DC
  Administered 2013-12-04 – 2013-12-08 (×5): 25 mg via ORAL
  Filled 2013-12-04 (×5): qty 1

## 2013-12-04 NOTE — Progress Notes (Signed)
[facility] PROGRESS NOTE  Wallis Spizzirri FAO:[id number] DOB: July 25, 1946 DOA: 12/03/2013 PCP: Merrilee Seashore, MD  Brief narrative 68 year old obese female with COPD and active heavy smoker, hypertension, hyperlipidemia who presented with acute COPD exacerbation.  Assessment/Plan: Acute COPD exacerbation Continue IV Solu-Medrol, scheduled nebs and O2 via nasal cannula. Monitor O2 sat. Counseled strongly on smoking cessation. Continue empiric Levaquin. Continue empiric  Tamiflu and pending Flu PCR.  Tobacco abuse continues to smoke about 2-1/2 packs a day. Order nicotine patch. Counseled strongly on suggestion  GERD Continue PPI  Hypertension Resume blood pressure medications  Hyperlipidemia Continue statin   Obesity  Check A1c and lipid panel.. Counseled on weight loss  Osteoarthritis Continue home regimen    Diet: Regular DVT prophylaxis  Code Status: Full code Family Communication: None at bedside  Disposition Plan: Home once improved   Consultants:  None  Procedures:  None  Antibiotics:  Levaquin  HPI/Subjective: Patient seen and examined this morning. Reports her breathing to be slightly better but still very wheezy and having cough.  Objective: Filed Vitals:   12/04/13 0629  BP: 152/87  Pulse: 93  Temp: 97.5 F (36.4 C)  Resp: 18   No intake or output data in the 24 hours ending 12/04/13 1030 There were no vitals filed for this visit.  Exam:   General:  Elderly obese female lying in bed in no acute distress  HEENT: No pallor, moist oral mucosa  Chest: Diffuse wheezing bilaterally  CVS: Normal S1 and S2, no murmurs or rubs  Abdomen: Soft, nontender, nondistended, bowel sounds present  Extremities: Warm, no edema  CNS: AAO x3    Data Reviewed: Basic Metabolic Panel:  Recent Labs Lab 12/03/13 1415  NA 138  K 3.8  CL 96  CO2 30  GLUCOSE 126*  BUN 10  CREATININE 0.58  CALCIUM 9.3   Liver Function Tests: No  results found for this basename: AST, ALT, ALKPHOS, BILITOT, PROT, ALBUMIN,  in the last 168 hours No results found for this basename: LIPASE, AMYLASE,  in the last 168 hours No results found for this basename: AMMONIA,  in the last 168 hours CBC:  Recent Labs Lab 12/03/13 1415  WBC 9.2  HGB 16.8*  HCT 52.4*  MCV 84.5  PLT 303   Cardiac Enzymes: No results found for this basename: CKTOTAL, CKMB, CKMBINDEX, TROPONINI,  in the last 168 hours BNP (last 3 results)  Recent Labs  12/03/13 1415  PROBNP 55.2   CBG:  Recent Labs Lab 12/04/13 0813  GLUCAP 131*    No results found for this or any previous visit (from the past 240 hour(s)).   Studies: Dg Chest 2 View  12/03/2013   CLINICAL DATA:  Shortness of breath.  Emphysema.  EXAM: CHEST  2 VIEW  COMPARISON:  None.  FINDINGS: The lungs are hyperinflated with flattening of the diaphragm consistent with emphysema. No acute infiltrates or effusions. Heart size and pulmonary vascularity are normal. No significant osseous abnormality.  IMPRESSION: Emphysema.  No acute abnormalities.   Electronically Signed   By: Rozetta Nunnery M.D.   On: 12/03/2013 14:07    Scheduled Meds: . atorvastatin  40 mg Oral q1800  . enoxaparin (LOVENOX) injection  40 mg Subcutaneous Q24H  . fluticasone  2 spray Each Nare Daily  . ipratropium-albuterol  3 mL Nebulization QID  . irbesartan-hydrochlorothiazide  2 tablet Oral Daily  . levofloxacin (LEVAQUIN) IV  500 mg Intravenous Q24H  . methylPREDNISolone (SOLU-MEDROL) injection  125 mg Intravenous Q12H  .  nicotine  21 mg Transdermal Daily  . oseltamivir  75 mg Oral BID  . pantoprazole  80 mg Oral Q1200  . pregabalin  200 mg Oral BID   Continuous Infusions:     Time spent: 25 minutes    Angala Hilgers, [place]  [facility] Pager [phone] If 7PM-7AM, please contact night-coverage at [email or web address], password [facility] 12/04/2013, 10:30 AM  LOS: 1 day

## 2013-12-05 ENCOUNTER — Inpatient Hospital Stay (HOSPITAL_COMMUNITY): Payer: Medicare Other

## 2013-12-05 DIAGNOSIS — J9600 Acute respiratory failure, unspecified whether with hypoxia or hypercapnia: Secondary | ICD-10-CM

## 2013-12-05 LAB — BLOOD GAS, ARTERIAL
Acid-Base Excess: 5.9 mmol/L — ABNORMAL HIGH (ref 0.0–2.0)
Bicarbonate: 31 mEq/L — ABNORMAL HIGH (ref 20.0–24.0)
Drawn by: 257701
FIO2: 0.4 %
O2 Saturation: 92.2 %
PATIENT TEMPERATURE: 98.6
PH ART: 7.43 (ref 7.350–7.450)
PO2 ART: 62.2 mmHg — AB (ref 80.0–100.0)
TCO2: 26.1 mmol/L (ref 0–100)
pCO2 arterial: 47.5 mmHg — ABNORMAL HIGH (ref 35.0–45.0)

## 2013-12-05 LAB — LIPID PANEL
CHOLESTEROL: 152 mg/dL (ref 0–200)
HDL: 48 mg/dL (ref >39)
LDL CALC: 73 mg/dL (ref 0–99)
TRIGLYCERIDES: 155 mg/dL — AB (ref <150)
Total CHOL/HDL Ratio: 3.2 RATIO
VLDL: 31 mg/dL (ref 0–40)

## 2013-12-05 LAB — GLUCOSE, CAPILLARY: Glucose-Capillary: 197 mg/dL — ABNORMAL HIGH (ref 70–99)

## 2013-12-05 MED ORDER — ALBUTEROL SULFATE (2.5 MG/3ML) 0.083% IN NEBU
2.5000 mg | INHALATION_SOLUTION | RESPIRATORY_TRACT | Status: DC | PRN
  Administered 2013-12-05: 2.5 mg via RESPIRATORY_TRACT

## 2013-12-05 MED ORDER — ALBUTEROL SULFATE (2.5 MG/3ML) 0.083% IN NEBU: 2.5000 mg | INHALATION_SOLUTION | RESPIRATORY_TRACT | Status: DC | PRN

## 2013-12-05 MED ORDER — ROPINIROLE HCL 1 MG PO TABS
1.0000 mg | ORAL_TABLET | Freq: Once | ORAL | Status: AC
  Administered 2013-12-05: 1 mg via ORAL
  Filled 2013-12-05: qty 1

## 2013-12-05 MED ORDER — IPRATROPIUM-ALBUTEROL 0.5-2.5 (3) MG/3ML IN SOLN
3.0000 mL | RESPIRATORY_TRACT | Status: DC
Start: 2013-12-05 — End: 2013-12-08
  Administered 2013-12-05 – 2013-12-08 (×19): 3 mL via RESPIRATORY_TRACT
  Filled 2013-12-05 (×18): qty 3

## 2013-12-05 MED ORDER — IPRATROPIUM-ALBUTEROL 0.5-2.5 (3) MG/3ML IN SOLN
3.0000 mL | Freq: Four times a day (QID) | RESPIRATORY_TRACT | Status: DC
Start: 2013-12-05 — End: 2013-12-05
  Administered 2013-12-05: 3 mL via RESPIRATORY_TRACT
  Filled 2013-12-05: qty 3

## 2013-12-05 MED ORDER — GUAIFENESIN-DM 100-10 MG/5ML PO SYRP
5.0000 mL | ORAL_SOLUTION | ORAL | Status: DC | PRN
  Administered 2013-12-05 – 2013-12-07 (×2): 5 mL via ORAL
  Filled 2013-12-05 (×2): qty 10

## 2013-12-05 MED ORDER — VITAMINS A & D EX OINT
TOPICAL_OINTMENT | CUTANEOUS | Status: AC
  Administered 2013-12-05: 5
  Filled 2013-12-05: qty 5

## 2013-12-05 MED ORDER — ALBUTEROL SULFATE (2.5 MG/3ML) 0.083% IN NEBU
INHALATION_SOLUTION | RESPIRATORY_TRACT | Status: AC
  Filled 2013-12-05: qty 3

## 2013-12-05 NOTE — Plan of Care (Signed)
Problem: Phase II Progression Outcomes Goal: Progress activity as tolerated unless otherwise ordered Outcome: Progressing Bedrest with bathroom priviledges

## 2013-12-05 NOTE — Progress Notes (Signed)
[facility] PROGRESS NOTE  Josefita Weissmann UXN:[id number] DOB: 11-03-1946 DOA: 12/03/2013 PCP: Merrilee Seashore, MD  Brief narrative  68 year old obese female with COPD and active heavy smoker, hypertension, hyperlipidemia who presented with acute COPD exacerbation.   Assessment/Plan:  Acute hypoxic respiratory failure secondary to COPD exacerbation  Continue IV Solu-Medrol, scheduled nebs every 4 hours and O2 via nasal cannula. Patient desaturated to  80s and placed on 6 liters via nasal cannula and now on Ventimask. -Noted to be extremely wheezy on exam. Ordered chest x-ray and blood gas.  Continue empiric Levaquin.. Counseled strongly on smoking cessation.  FlU PCR negative. Tamiflu discontinued  Tobacco abuse  continues to smoke about 2-1/2 packs a day. Order nicotine patch. Counseled strongly on suggestion.   GERD  Continue PPI   Hypertension  Resume blood pressure medications   Hyperlipidemia  Continue statin   Obesity  Check A1c and lipid panel.. Counseled on weight loss   Osteoarthritis  Continue home regimen   Diet: Low-sodium  DVT prophylaxis  Code Status: Full code  Family Communication: None at bedside  Disposition Plan: Home once improved   Consultants:  None   Procedures:  None   Antibiotics:  Levaquin   HPI/Subjective:  Patient seen and examined this morning. Patient was desaturating to 80s on nasal cannula. Reports cough and shortness of breath . Denies chest pain.  Objective: Filed Vitals:   12/05/13 1120  BP:   Pulse:   Temp:   Resp: 36    Intake/Output Summary (Last 24 hours) at 12/05/13 1316 Last data filed at 12/05/13 0830  Gross per 24 hour  Intake    240 ml  Output      0 ml  Net    240 ml   Filed Weights   12/04/13 1100  Weight: 101.2 kg (223 lb 1.7 oz)    Exam:  General: Elderly obese female lying in bed in no acute distress  HEENT: No pallor, moist oral mucosa  Chest: Diffuse wheezing bilaterally  CVS: Normal  S1 and S2, no murmurs or rubs  Abdomen: Soft, nontender, nondistended, bowel sounds present  Extremities: Warm, no edema  CNS: AAO x3   Data Reviewed: Basic Metabolic Panel:  Recent Labs Lab 12/03/13 1415  NA 138  K 3.8  CL 96  CO2 30  GLUCOSE 126*  BUN 10  CREATININE 0.58  CALCIUM 9.3   Liver Function Tests: No results found for this basename: AST, ALT, ALKPHOS, BILITOT, PROT, ALBUMIN,  in the last 168 hours No results found for this basename: LIPASE, AMYLASE,  in the last 168 hours No results found for this basename: AMMONIA,  in the last 168 hours CBC:  Recent Labs Lab 12/03/13 1415  WBC 9.2  HGB 16.8*  HCT 52.4*  MCV 84.5  PLT 303   Cardiac Enzymes: No results found for this basename: CKTOTAL, CKMB, CKMBINDEX, TROPONINI,  in the last 168 hours BNP (last 3 results)  Recent Labs  12/03/13 1415  PROBNP 55.2   CBG:  Recent Labs Lab 12/04/13 0813 12/05/13 1158  GLUCAP 131* 197*    No results found for this or any previous visit (from the past 240 hour(s)).   Studies: Dg Chest 2 View  12/03/2013   CLINICAL DATA:  Shortness of breath.  Emphysema.  EXAM: CHEST  2 VIEW  COMPARISON:  None.  FINDINGS: The lungs are hyperinflated with flattening of the diaphragm consistent with emphysema. No acute infiltrates or effusions. Heart size and pulmonary vascularity are normal. No  significant osseous abnormality.  IMPRESSION: Emphysema.  No acute abnormalities.   Electronically Signed   By: Rozetta Nunnery M.D.   On: 12/03/2013 14:07    Scheduled Meds: . atorvastatin  40 mg Oral q1800  . enoxaparin (LOVENOX) injection  40 mg Subcutaneous Q24H  . fluticasone  2 spray Each Nare Daily  . irbesartan  300 mg Oral Daily   And  . hydrochlorothiazide  25 mg Oral Daily  . ipratropium-albuterol  3 mL Nebulization Q4H  . levofloxacin (LEVAQUIN) IV  500 mg Intravenous Q24H  . methylPREDNISolone (SOLU-MEDROL) injection  125 mg Intravenous Q12H  . nicotine  21 mg Transdermal  Daily  . pantoprazole  80 mg Oral Q1200  . pregabalin  200 mg Oral BID   Continuous Infusions:     Time spent: 35 minutes    Vy Badley, [facility] Pager [phone] If 7PM-7AM, please contact night-coverage at [email or web address], password [facility] 12/05/2013, 1:16 PM  LOS: 2 days

## 2013-12-06 LAB — GLUCOSE, CAPILLARY: Glucose-Capillary: 131 mg/dL — ABNORMAL HIGH (ref 70–99)

## 2013-12-06 NOTE — Progress Notes (Signed)
Patient noted to be using venturi mask with humidifier bottle attached. Removed from mask, scheduled BD given, and replaced back on nasal canula with humidifier. Education provided to both the patient and the RN as to why an air entrainment mask is not compatible to use with a bubble bottle. Aerosol face mask provided and available at bedside for use in the event the patient would like or need to go back onto a humidified apparatus via face mask. However, she asks to remain on nasal canula if her O2 saturations allow. RT agrees.

## 2013-12-06 NOTE — Progress Notes (Signed)
[facility] PROGRESS NOTE  [name] MLY:[id number] DOB: [birth year] DOA: 12/03/2013 PCP: Merrilee Seashore, MD  Brief narrative  68 year old obese female with COPD and active heavy smoker, hypertension, hyperlipidemia who presented with acute COPD exacerbation.   Assessment/Plan:  Acute hypoxic respiratory failure secondary to COPD exacerbation  Continue IV Solu-Medrol, scheduled nebs every 4 hours and O2 via nasal cannula. Patient now alternating between venturi mask and 5-6L via [place]. Stable on 5 L this am -has bilateral expiratory wheezes but improved from yesterday. Blood gas shows hypoxemia and CXR unremarkable. Continue empiric Levaquin.. Counseled strongly on smoking cessation.  FlU PCR negative.    Tobacco abuse  continues to smoke about 2-1/2 packs a day. Order nicotine patch. Counseled strongly on suggestion.   GERD  Continue PPI   Hypertension  Resume blood pressure medications   Hyperlipidemia  Continue statin   Obesity  Has prediabetes with A1C of 6.4.. Counseled on weight loss   Osteoarthritis  Continue home regimen   Diet: Low-sodium   DVT prophylaxis   Code Status: Full code  Family Communication: sisters at bedside  Disposition Plan: Home once improved   Consultants:  None   Procedures:  None   Antibiotics:  Levaquin  HPI/Subjective:  Patient seen and examined this morning. Patient maintains sat in low 90s on 5L. Reports cough. Feels breathing slightly better.   Objective: Filed Vitals:   12/06/13 0515  BP: 151/74  Pulse: 96  Temp: 97.8 F (36.6 C)  Resp: 24    Intake/Output Summary (Last 24 hours) at 12/06/13 1121 Last data filed at 12/06/13 0800  Gross per 24 hour  Intake    540 ml  Output      0 ml  Net    540 ml   Filed Weights   12/04/13 1100  Weight: 101.2 kg (223 lb 1.7 oz)    Exam:  General: Elderly obese female lying in bed in no acute distress  HEENT: No pallor, moist oral mucosa  Chest: scattered  expiratory wheeze b/l CVS: Normal S1 and S2, no murmurs or rubs  Abdomen: Soft, nontender, nondistended, bowel sounds present  Extremities: Warm, no edema  CNS: AAO x3   Data Reviewed: Basic Metabolic Panel:  Recent Labs Lab 12/03/13 1415  NA 138  K 3.8  CL 96  CO2 30  GLUCOSE 126*  BUN 10  CREATININE 0.58  CALCIUM 9.3   Liver Function Tests: No results found for this basename: AST, ALT, ALKPHOS, BILITOT, PROT, ALBUMIN,  in the last 168 hours No results found for this basename: LIPASE, AMYLASE,  in the last 168 hours No results found for this basename: AMMONIA,  in the last 168 hours CBC:  Recent Labs Lab 12/03/13 1415  WBC 9.2  HGB 16.8*  HCT 52.4*  MCV 84.5  PLT 303   Cardiac Enzymes: No results found for this basename: CKTOTAL, CKMB, CKMBINDEX, TROPONINI,  in the last 168 hours BNP (last 3 results)  Recent Labs  12/03/13 1415  PROBNP 55.2   CBG:  Recent Labs Lab 12/04/13 0813 12/05/13 1158 12/06/13 0725  GLUCAP 131* 197* 131*    No results found for this or any previous visit (from the past 240 hour(s)).   Studies: Dg Chest Port 1 View  12/05/2013   CLINICAL DATA:  Short of breath.  History of COPD.  EXAM: PORTABLE CHEST - 1 VIEW  COMPARISON:  12/03/2013.  FINDINGS: The lungs are hyperexpanded with prominent interstitial markings, but no consolidation or edema. There is no  pleural effusion or pneumothorax. The cardiac silhouette normal in size. Normal mediastinal and hilar contours  IMPRESSION: No acute cardiopulmonary disease.  COPD.   Electronically Signed   By: Lajean Manes M.D.   On: 12/05/2013 13:43    Scheduled Meds: . atorvastatin  40 mg Oral q1800  . enoxaparin (LOVENOX) injection  40 mg Subcutaneous Q24H  . fluticasone  2 spray Each Nare Daily  . irbesartan  300 mg Oral Daily   And  . hydrochlorothiazide  25 mg Oral Daily  . ipratropium-albuterol  3 mL Nebulization Q4H  . levofloxacin (LEVAQUIN) IV  500 mg Intravenous Q24H  .  methylPREDNISolone (SOLU-MEDROL) injection  125 mg Intravenous Q12H  . nicotine  21 mg Transdermal Daily  . pantoprazole  80 mg Oral Q1200  . pregabalin  200 mg Oral BID   Continuous Infusions:    Time spent: 25  minutes    Katrese Shell, [place]  [facility] Pager [phone] If 7PM-7AM, please contact night-coverage at [email or web address], password [facility] 12/06/2013, 11:21 AM  LOS: 3 days

## 2013-12-06 NOTE — Progress Notes (Signed)
Pt states she was diagnosed with thrush last week at her PCP and he called in a script for her. She never got it because she came to the hospital. States her inside of the Rt cheek is getting sore, a white patch was noted.

## 2013-12-07 DIAGNOSIS — E669 Obesity, unspecified: Secondary | ICD-10-CM

## 2013-12-07 LAB — GLUCOSE, CAPILLARY: GLUCOSE-CAPILLARY: 153 mg/dL — AB (ref 70–99)

## 2013-12-07 MED ORDER — LEVOFLOXACIN 500 MG PO TABS
500.0000 mg | ORAL_TABLET | ORAL | Status: DC
  Administered 2013-12-07: 500 mg via ORAL
  Filled 2013-12-07 (×2): qty 1

## 2013-12-07 MED ORDER — IPRATROPIUM-ALBUTEROL 0.5-2.5 (3) MG/3ML IN SOLN
RESPIRATORY_TRACT | Status: AC
  Filled 2013-12-07: qty 3

## 2013-12-07 MED ORDER — NYSTATIN 100000 UNIT/ML MT SUSP
5.0000 mL | Freq: Four times a day (QID) | OROMUCOSAL | Status: DC
  Administered 2013-12-07 – 2013-12-08 (×6): 500000 [IU] via ORAL
  Filled 2013-12-07 (×8): qty 5

## 2013-12-07 MED ORDER — PHENOL 1.4 % MT LIQD
1.0000 | OROMUCOSAL | Status: DC | PRN
  Administered 2013-12-07: 1 via OROMUCOSAL
  Filled 2013-12-07: qty 177

## 2013-12-07 MED ORDER — MAGIC MOUTHWASH
5.0000 mL | Freq: Four times a day (QID) | ORAL | Status: DC | PRN
  Filled 2013-12-07: qty 5

## 2013-12-07 NOTE — Progress Notes (Signed)
PHARMACIST - PHYSICIAN COMMUNICATION CONCERNING: Antibiotic IV to Oral Route Change Policy  RECOMMENDATION: This patient is receiving Levaquin by the intravenous route.  Based on criteria approved by the Pharmacy and Therapeutics Committee, the antibiotic(s) is/are being converted to the equivalent oral dose form(s).   DESCRIPTION: These criteria include:  Patient being treated for a respiratory tract infection, urinary tract infection, or cellulitis  The patient is not neutropenic and does not exhibit a GI malabsorption state  The patient is eating (either orally or via tube) and/or has been taking other orally administered medications for a least 24 hours  The patient is improving clinically and has a Tmax < 100.5  If you have questions about this conversion, please contact the Pharmacy Department  []   [phone] )  [facility] []   [phone] )  [facility]  []   [phone] )  [facility] [x]   [phone] )  [place], PharmD, BCPS Pager: [phone] 8:50 AM Pharmacy #: [phone]

## 2013-12-07 NOTE — Progress Notes (Signed)
Notified Darrell Counselling psychologist that telemetry being discontinued

## 2013-12-07 NOTE — Progress Notes (Signed)
[facility] PROGRESS NOTE  [name] OIN:[id number] DOB: [birth year] DOA: 12/03/2013 PCP: Merrilee Seashore, MD  Brief narrative  68 year old obese female with COPD and active heavy smoker, hypertension, hyperlipidemia who presented with acute COPD exacerbation.   Assessment/Plan:  Acute hypoxic respiratory failure secondary to COPD exacerbation  Continue IV Solu-Medrol (will reduce the dose to 60 mg every 12 hours), scheduled nebs every 4 hours and O2 via nasal cannula. Patient improving very slowly and now transitioned to 3 L of rate nasal cannula and maintaining sat between 90-93%. Chest x-ray unremarkable. Blood gas shows hypoxemia.  Continue empiric Levaquin.. Counseled strongly on smoking cessation.  FlU PCR negative.   Tobacco abuse  continues to smoke about 2-1/2 packs a day. Continue  nicotine patch. Counseled strongly on cessation.  Oral thrush  Ordered nystatin swish and swallow  GERD  Continue PPI   Hypertension  Resume blood pressure medications   Hyperlipidemia  Continue statin   Obesity  Has prediabetes with A1C of 6.4.. Counseled on weight loss   Osteoarthritis  Continue home regimen      Diet: Low-sodium   DVT prophylaxis   Code Status: Full code  Family Communication: sisters at bedside   Disposition Plan: Home if breathing continues to improve ( likely in next 1-2 days. Most likely needs home o2 upon discharge and outpt pulmonary referral.   Consultants:  None  Procedures:  None   Antibiotics:  Levaquin ( completes after 2/4)  HPI/Subjective:  Patient seen and examined this morning. Shortness of breath slightly better and able to ambulate in the hallway. O2 sat maintained at 3 L. Complains of pain over on the side of the tongue   Objective: Filed Vitals:   12/07/13 0830  BP: 129/79  Pulse: 90  Temp: 97.9 F (36.6 C)  Resp: 18    Intake/Output Summary (Last 24 hours) at 12/07/13 1213 Last data filed at 12/07/13 0600  Gross per 24 hour  Intake    480 ml  Output      0 ml  Net    480 ml   Filed Weights   12/04/13 1100  Weight: 101.2 kg (223 lb 1.7 oz)    Exam:  General: Elderly obese female lying in bed in no acute distress  HEENT: No pallor, moist oral mucosa , small area of oral thrush underneath her right buccal mucosa Chest: scattered expiratory wheeze b/l  CVS: Normal S1 and S2, no murmurs or rubs  Abdomen: Soft, nontender, nondistended, bowel sounds present  Extremities: Warm, no edema  CNS: AAO x3   Data Reviewed: Basic Metabolic Panel:  Recent Labs Lab 12/03/13 1415  NA 138  K 3.8  CL 96  CO2 30  GLUCOSE 126*  BUN 10  CREATININE 0.58  CALCIUM 9.3   Liver Function Tests: No results found for this basename: AST, ALT, ALKPHOS, BILITOT, PROT, ALBUMIN,  in the last 168 hours No results found for this basename: LIPASE, AMYLASE,  in the last 168 hours No results found for this basename: AMMONIA,  in the last 168 hours CBC:  Recent Labs Lab 12/03/13 1415  WBC 9.2  HGB 16.8*  HCT 52.4*  MCV 84.5  PLT 303   Cardiac Enzymes: No results found for this basename: CKTOTAL, CKMB, CKMBINDEX, TROPONINI,  in the last 168 hours BNP (last 3 results)  Recent Labs  12/03/13 1415  PROBNP 55.2   CBG:  Recent Labs Lab 12/04/13 0813 12/05/13 1158 12/06/13 0725 12/07/13 0704  GLUCAP 131* 197* 131* 153*  No results found for this or any previous visit (from the past 240 hour(s)).   Studies: Dg Chest Port 1 View  12/05/2013   CLINICAL DATA:  Short of breath.  History of COPD.  EXAM: PORTABLE CHEST - 1 VIEW  COMPARISON:  12/03/2013.  FINDINGS: The lungs are hyperexpanded with prominent interstitial markings, but no consolidation or edema. There is no pleural effusion or pneumothorax. The cardiac silhouette normal in size. Normal mediastinal and hilar contours  IMPRESSION: No acute cardiopulmonary disease.  COPD.   Electronically Signed   By: Lajean Manes M.D.   On: 12/05/2013  13:43    Scheduled Meds: . atorvastatin  40 mg Oral q1800  . enoxaparin (LOVENOX) injection  40 mg Subcutaneous Q24H  . fluticasone  2 spray Each Nare Daily  . irbesartan  300 mg Oral Daily   And  . hydrochlorothiazide  25 mg Oral Daily  . ipratropium-albuterol  3 mL Nebulization Q4H  . levofloxacin  500 mg Oral Q24H  . methylPREDNISolone (SOLU-MEDROL) injection  125 mg Intravenous Q12H  . nicotine  21 mg Transdermal Daily  . nystatin  5 mL Oral QID  . pantoprazole  80 mg Oral Q1200  . pregabalin  200 mg Oral BID   Continuous Infusions:     Time spent: 25 minutes    [name]  [facility] Pager [phone]. If 7PM-7AM, please contact night-coverage at [email or web address], password [facility] 12/07/2013, 12:13 PM  LOS: 4 days

## 2013-12-07 NOTE — Progress Notes (Signed)
10-06-14 at 9:08 the patient was on O2 via nasal cannula at 5L with an Sat O2-92%-93% Took off O2 Ambulate approximately 100 feet on room air, Sat O2 dropped to 78% Returned, resting on O2 via nasal cannula at 5L with a return of Sat O2 > 90%, took approx 5 minutes Jamice Carreno, Chana Bode, Student-RN/ Moreen Fowler PhD, RN

## 2013-12-07 NOTE — Progress Notes (Signed)
Pt slept well during night.  Left message for on call MD about her thrush.  Waiting for call back.  No c/o pain or distress.

## 2013-12-07 NOTE — Progress Notes (Addendum)
Patient's oxygen decreased to 3L via nasal cannula.  Patient sitting in recliner, discussed smoke cessation, use of nicotine patches, home oxygen, and activity levels.  We also discussed her sister's recent diagnosis of lung cancer, offered emotional support.  Discussed the pulmonary rehab center and obtaining an appointment/referral to a pulmonologist.

## 2013-12-08 LAB — GLUCOSE, CAPILLARY: Glucose-Capillary: 144 mg/dL — ABNORMAL HIGH (ref 70–99)

## 2013-12-08 MED ORDER — LEVOFLOXACIN 500 MG PO TABS: 500.0000 mg | ORAL_TABLET | ORAL | Status: DC

## 2013-12-08 MED ORDER — OXYCODONE-ACETAMINOPHEN 10-325 MG PO TABS: 1.0000 | ORAL_TABLET | Freq: Four times a day (QID) | ORAL | Status: AC | PRN

## 2013-12-08 MED ORDER — IPRATROPIUM-ALBUTEROL 20-100 MCG/ACT IN AERS: 1.0000 | INHALATION_SPRAY | Freq: Four times a day (QID) | RESPIRATORY_TRACT | Status: DC

## 2013-12-08 MED ORDER — IPRATROPIUM-ALBUTEROL 0.5-2.5 (3) MG/3ML IN SOLN: 3.0000 mL | RESPIRATORY_TRACT | Status: DC

## 2013-12-08 MED ORDER — PREDNISONE 10 MG PO TABS: ORAL_TABLET | ORAL | Status: DC

## 2013-12-08 MED ORDER — NICOTINE 21 MG/24HR TD PT24: 21.0000 mg | MEDICATED_PATCH | Freq: Every day | TRANSDERMAL | Status: DC

## 2013-12-08 NOTE — Progress Notes (Signed)
Pt refused to be signed up on [email or web address] b/c she doesn't have a computer at home.

## 2013-12-08 NOTE — Discharge Instructions (Signed)
Chronic Obstructive Pulmonary Disease  Chronic obstructive pulmonary disease (COPD) is a common lung condition in which airflow from the lungs is limited. COPD is a general term that can be used to describe many different lung problems that limit airflow, including both chronic bronchitis and emphysema.  If you have COPD, your lung function will probably never return to normal, but there are measures you can take to improve lung function and make yourself feel better.   CAUSES   · Smoking (common).    · Exposure to secondhand smoke.    · Genetic problems.  · Chronic inflammatory lung diseases or recurrent infections.  SYMPTOMS   · Shortness of breath, especially with physical activity.    · Deep, persistent (chronic) cough with a large amount of thick mucus.    · Wheezing.    · Rapid breaths (tachypnea).    · Gray or bluish discoloration (cyanosis) of the skin, especially in fingers, toes, or lips.    · Fatigue.    · Weight loss.    · Frequent infections or episodes when breathing symptoms become much worse (exacerbations).    · Chest tightness.  DIAGNOSIS   Your healthcare provider will take a medical history and perform a physical examination to make the initial diagnosis.  Additional tests for COPD may include:   · Lung (pulmonary) function tests.  · Chest X-ray.  · CT scan.  · Blood tests.  TREATMENT   Treatment available to help you feel better when you have COPD include:   · Inhaler and nebulizer medicines. These help manage the symptoms of COPD and make your breathing more comfortable  · Supplemental oxygen. Supplemental oxygen is only helpful if you have a low oxygen level in your blood.    · Exercise and physical activity. These are beneficial for nearly all people with COPD. Some people may also benefit from a pulmonary rehabilitation program.  HOME CARE INSTRUCTIONS   · Take all medicines (inhaled or pills) as directed by your health care provider.  · Only take over-the-counter or prescription medicines  for pain, fever, or discomfort as directed by your health care provider.    · Avoid over-the-counter medicines or cough syrups that dry up your airway (such as antihistamines) and slow down the elimination of secretions unless instructed otherwise by your healthcare provider.    · If you are a smoker, the most important thing that you can do is stop smoking. Continuing to smoke will cause further lung damage and breathing trouble. Ask your health care provider for help with quitting smoking. He or she can direct you to community resources or hospitals that provide support.  · Avoid exposure to irritants such as smoke, chemicals, and fumes that aggravate your breathing.  · Use oxygen therapy and pulmonary rehabilitation if directed by your health care provider. If you require home oxygen therapy, ask your healthcare provider whether you should purchase a pulse oximeter to measure your oxygen level at home.    · Avoid contact with individuals who have a contagious illness.  · Avoid extreme temperature and humidity changes.  · Eat healthy foods. Eating smaller, more frequent meals and resting before meals may help you maintain your strength.  · Stay active, but balance activity with periods of rest. Exercise and physical activity will help you maintain your ability to do things you want to do.  · Preventing infection and hospitalization is very important when you have COPD. Make sure to receive all the vaccines your health care provider recommends, especially the pneumococcal and influenza vaccines. Ask your healthcare provider whether you   need a pneumonia vaccine.  · Learn and use relaxation techniques to manage stress.  · Learn and use controlled breathing techniques as directed by your health care provider. Controlled breathing techniques include:    · Pursed lip breathing. Start by breathing in (inhaling) through your nose for 1 second. Then, purse your lips as if you were going to whistle and breathe out (exhale)  through the pursed lips for 2 seconds.    · Diaphragmatic breathing. Start by putting one hand on your abdomen just above your waist. Inhale slowly through your nose. The hand on your abdomen should move out. Then purse your lips and exhale slowly. You should be able to feel the hand on your abdomen moving in as you exhale.    · Learn and use controlled coughing to clear mucus from your lungs. Controlled coughing is a series of short, progressive coughs. The steps of controlled coughing are:    1. Lean your head slightly forward.    2. Breathe in deeply using diaphragmatic breathing.    3. Try to hold your breath for 3 seconds.    4. Keep your mouth slightly open while coughing twice.    5. Spit any mucus out into a tissue.    6. Rest and repeat the steps once or twice as needed.  SEEK MEDICAL CARE IF:   · You are coughing up more mucus than usual.    · There is a change in the color or thickness of your mucus.    · Your breathing is more labored than usual.    · Your breathing is faster than usual.    SEEK IMMEDIATE MEDICAL CARE IF:   · You have shortness of breath while you are resting.    · You have shortness of breath that prevents you from:  · Being able to talk.    · Performing your usual physical activities.    · You have chest pain lasting longer than 5 minutes.    · Your skin color is more cyanotic than usual.  · You measure low oxygen saturations for longer than 5 minutes with a pulse oximeter.  MAKE SURE YOU:   · Understand these instructions.  · Will watch your condition.  · Will get help right away if you are not doing well or get worse.  Document Released: 07/31/2005 Document Revised: 08/11/2013 Document Reviewed: 06/17/2013  [employer]® Patient Information ©2014 [employer].

## 2013-12-08 NOTE — Progress Notes (Signed)
Pt's O2 on room air at rest was 80%.

## 2013-12-08 NOTE — Progress Notes (Signed)
[facility]  [facility] is providing the following services: Oxygen and Nebulizer setup  If patient discharges after hours, please call [phone].   [name] 12/08/2013, 10:40 AM

## 2013-12-08 NOTE — Care Management Note (Signed)
    Page 1 of 1   12/08/2013     11:05:09 AM   CARE MANAGEMENT NOTE 12/08/2013  Patient:  [name], [name]   Account Number:  [id number]  Date Initiated:  12/06/2013  Documentation initiated by:  [facility]  Subjective/Objective Assessment:   68 year old female admitted with COPD exacerbation.     Action/Plan:   From home.   Anticipated DC Date:  12/08/2013   Anticipated DC Plan:  [place]  CM consult      Choice offered to / List presented to:     DME arranged  NEBULIZER MACHINE  OXYGEN      DME agency  [place].        Status of service:  In process, will continue to follow Medicare Important Message given?  NA - LOS <3 / Initial given by admissions (If response is "NO", the following Medicare IM given date fields will be blank) Date Medicare IM given:   Date Additional Medicare IM given:    Discharge Disposition:    Per UR Regulation:  Reviewed for med. necessity/level of care/duration of stay  If discussed at [place] of Stay Meetings, dates discussed:    Comments:  12/08/13 Donna Dillon RN BSN Orders in [facility] for HHPT/SW. Spoke with bedside RN who stated pt was ambulating independently and does not need HHPT. Order cancelled.

## 2013-12-08 NOTE — Discharge Summary (Signed)
Physician Discharge Summary  Curry Dulski PJK:[id number] DOB: [birth year] DOA: 12/03/2013  PCP: Merrilee Seashore, MD  Admit date: 12/03/2013 Discharge date: 12/08/2013  Recommendations for Outpatient Follow-up:  1. Pt will need to follow up with PCP and appointment scheduled Feb 18 th, 2015 at 11 am 2. Please obtain BMP to evaluate electrolytes and kidney function 3. Please also check CBC to evaluate Hg and Hct levels 4. Pt discharge on Prednisone taper pack starting with 50 mg tablet daily 5. Levaquin 5 days upon discharge prescribed 6. Pt given nebulizer machine upon discharge  7. Pt also discharge on oxygen 4-5 liters continuous use  8. Appointment with pulmonologist Dr. Melvyn Novas scheduled February 10th, 2015 at 9 am  Discharge Diagnoses: Acute hypoxic respiratory failure secondary to COPD exacerbation  Principal Problem:   COPD exacerbation Active Problems:   HTN (hypertension)   HLD (hyperlipidemia)   Obesity   Tobacco abuse   GERD (gastroesophageal reflux disease)   OA (osteoarthritis)   Acute respiratory failure with hypoxia  Discharge Condition: Stable  Diet recommendation: Heart healthy diet discussed in details   HPI: 68 year old obese female with COPD and active heavy smoker, hypertension, hyperlipidemia who presented with acute COPD exacerbation.   Assessment/Plan:  Acute hypoxic respiratory failure secondary to COPD exacerbation  Continued IV Solu -Medrol, scheduled nebs every 4 hours and O2 via nasal cannula Pt has responded well but continues to require oxygen, will need oxygen upon discharge Chest x-ray unremarkable. Blood gas shows hypoxemia.  Counseled strongly on smoking cessation.  Pt has follow up appointment with pulmonologist as noted above Pt given nebulizer machine to take home  Tobacco abuse  Continues to smoke about 2-1/2 packs a day. Continued nicotine patch.  Counseled strongly on cessation.  Oral thrush  Ordered nystatin swish and swallow  GERD   Continue PPI  Hypertension  Resume blood pressure medications upon discharge  Hyperlipidemia  Continue statin  Obesity  Has prediabetes with A1C of 6.4.. Counseled on weight loss  Osteoarthritis  Continue home regimen    Code Status: Full code  Family Communication: sisters at bedside   Consultants:  None  Procedures:  None Antibiotics:  Levaquin for 5 more days post discharge  Discharge Exam: Filed Vitals:   12/08/13 0930  BP: 131/78  Pulse: 90  Temp: 97.7 F (36.5 C)  Resp: 20   Filed Vitals:   12/08/13 0658 12/08/13 0730 12/08/13 0758 12/08/13 0930  BP: 121/76   131/78  Pulse: 61  92 90  Temp: 97.9 F (36.6 C)   97.7 F (36.5 C)  TempSrc: Oral   Oral  Resp: 20   20  Height:      Weight:      SpO2: 98% 92% 80% 90%    General: Pt is alert, follows commands appropriately, not in acute distress Cardiovascular: Regular rate and rhythm, S1/S2 +, no murmurs, no rubs, no gallops Respiratory: Clear to auscultation bilaterally, no wheezing, no crackles, no rhonchi Abdominal: Soft, non tender, non distended, bowel sounds +, no guarding Extremities: no edema, no cyanosis, pulses palpable bilaterally DP and PT Neuro: Grossly nonfocal  Discharge Instructions  Discharge Orders   Future Orders Complete By Expires   Diet - low sodium heart healthy  As directed    Increase activity slowly  As directed        Medication List    STOP taking these medications       cefUROXime 250 MG tablet  Commonly known as:  CEFTIN  oseltamivir 75 MG capsule  Commonly known as:  TAMIFLU      TAKE these medications       albuterol 108 (90 BASE) MCG/ACT inhaler  Commonly known as:  PROVENTIL HFA;VENTOLIN HFA  Inhale 2 puffs into the lungs daily as needed for wheezing or shortness of breath.     atorvastatin 40 MG tablet  Commonly known as:  LIPITOR  Take 40 mg by mouth daily.     celecoxib 200 MG capsule  Commonly known as:  CELEBREX  Take 200 mg by mouth 2 (two)  times daily.     chlorpheniramine-phenylephrine 9-25 MG Tabs  Commonly known as:  RYNATAN  Take 1 tablet by mouth every 12 (twelve) hours as needed.     cyclobenzaprine 10 MG tablet  Commonly known as:  FLEXERIL  Take 10 mg by mouth 2 (two) times daily as needed for muscle spasms.     esomeprazole 40 MG capsule  Commonly known as:  NEXIUM  Take 40 mg by mouth daily as needed (acid reflex).     fluticasone 50 MCG/ACT nasal spray  Commonly known as:  FLONASE  Place 2 sprays into both nostrils daily.     Ipratropium-Albuterol 20-100 MCG/ACT Aers respimat  Commonly known as:  COMBIVENT RESPIMAT  Inhale 1 puff into the lungs 4 (four) times daily.     ipratropium-albuterol 0.5-2.5 (3) MG/3ML Soln  Commonly known as:  DUONEB  Take 3 mLs by nebulization every 4 (four) hours. Give box of pre measured containers if possible     irbesartan-hydrochlorothiazide 150-12.5 MG per tablet  Commonly known as:  AVALIDE  Take 2 tablets by mouth daily. For blood pressure     levofloxacin 500 MG tablet  Commonly known as:  LEVAQUIN  Take 1 tablet (500 mg total) by mouth daily.     nicotine 21 mg/24hr patch  Commonly known as:  NICODERM CQ - dosed in mg/24 hours  Place 1 patch (21 mg total) onto the skin daily.     oxyCODONE-acetaminophen 10-325 MG per tablet  Commonly known as:  PERCOCET  Take 1 tablet by mouth every 6 (six) hours as needed for pain.     predniSONE 10 MG tablet  Commonly known as:  DELTASONE  Take 50 mg tablet  today, tapered down by 10 mg tablet daily until completed.     pregabalin 200 MG capsule  Commonly known as:  LYRICA  Take 200 mg by mouth 2 (two) times daily.     rOPINIRole 1 MG tablet  Commonly known as:  REQUIP  Take 1 mg by mouth at bedtime as needed (legs spasms).           Follow-up Information   Follow up with [facility], MD On 12/22/2013. (appointment scheduled at 11 am on February 18th, 2015)    Specialty:  Internal Medicine   Contact  information:   [place] [address] [phone]       Follow up with Rigoberto Noel., MD.   Specialty:  Pulmonary Disease   Contact information:   [address] [place] [address] [phone]       Follow up with Faye Ramsay, MD. (As needed if symptoms worsen)    Specialty:  Internal Medicine   Contact information:   [address] [place] [address] [phone]        The results of significant diagnostics from this hospitalization (including imaging, microbiology, ancillary and laboratory) are listed below for  reference.     Microbiology: No results found for this or any previous visit (from the past 240 hour(s)).   Labs: Basic Metabolic Panel:  Recent Labs Lab 12/03/13 1415  NA 138  K 3.8  CL 96  CO2 30  GLUCOSE 126*  BUN 10  CREATININE 0.58  CALCIUM 9.3   CBC:  Recent Labs Lab 12/03/13 1415  WBC 9.2  HGB 16.8*  HCT 52.4*  MCV 84.5  PLT 303   BNP: BNP (last 3 results)  Recent Labs  12/03/13 1415  PROBNP 55.2   CBG:  Recent Labs Lab 12/04/13 0813 12/05/13 1158 12/06/13 0725 12/07/13 0704 12/08/13 0730  GLUCAP 131* 197* 131* 153* 144*     SIGNED: Time coordinating discharge: Over 30 minutes  Faye Ramsay, MD  [facility] 12/08/2013, 10:53 AM Pager [phone]  If 7PM-7AM, please contact night-coverage [email or web address] Password [email or web address]

## 2013-12-14 ENCOUNTER — Ambulatory Visit (INDEPENDENT_AMBULATORY_CARE_PROVIDER_SITE_OTHER): Payer: Medicare Other | Admitting: Internal Medicine

## 2013-12-14 ENCOUNTER — Encounter: Payer: Self-pay | Admitting: Internal Medicine

## 2013-12-14 VITALS — BP 124/60 | HR 117 | Temp 98.1°F | Ht 65.6 in | Wt 217.8 lb

## 2013-12-14 DIAGNOSIS — J449 Chronic obstructive pulmonary disease, unspecified: Secondary | ICD-10-CM

## 2013-12-14 DIAGNOSIS — J9610 Chronic respiratory failure, unspecified whether with hypoxia or hypercapnia: Secondary | ICD-10-CM

## 2013-12-14 NOTE — Progress Notes (Signed)
Subjective:     Patient ID: [name], female   DOB: 1946/10/12  MRN: [id number]  HPI  68 yowf  Quit smoking 1/230/15 and referred by [facility]   12/14/2013 in pulmonary clinic  for first time s/p hospitalization for copd   Admit date: 12/03/2013  Discharge date: 12/08/2013  Recommendations for Outpatient Follow-up:  1. Pt will need to follow up with PCP and appointment scheduled Feb 18 th, 2015 at 11 am 2. Please obtain BMP to evaluate electrolytes and kidney function 3. Please also check CBC to evaluate Hg and Hct levels 4. Pt discharge on Prednisone taper pack starting with 50 mg tablet daily 5. Levaquin 5 days upon discharge prescribed 6. Pt given nebulizer machine upon discharge  7. Pt also discharge on oxygen 4-5 liters continuous use  8. Appointment with pulmonologist Dr. Melvyn Novas scheduled February 10th, 2015 at 9 am Discharge Diagnoses: Acute hypoxic respiratory failure secondary to COPD exacerbation  Principal Problem:  COPD exacerbation  Active Problems:  HTN (hypertension)  HLD (hyperlipidemia)  Obesity  Tobacco abuse  GERD (gastroesophageal reflux disease)  OA (osteoarthritis)  Acute respiratory failure with hypoxia  Discharge Condition: Stable  Diet recommendation: Heart healthy diet discussed in details  HPI:  68 year old obese female with COPD and active heavy smoker, hypertension, hyperlipidemia who presented with acute COPD exacerbation.  Assessment/Plan:  Acute hypoxic respiratory failure secondary to COPD exacerbation  Continued IV Solu -Medrol, scheduled nebs every 4 hours and O2 via nasal cannula  Pt has responded well but continues to require oxygen, will need oxygen upon discharge  Chest x-ray unremarkable. Blood gas shows hypoxemia.  Counseled strongly on smoking cessation.  Pt has follow up appointment with pulmonologist as noted above  Pt given nebulizer machine to take home  Tobacco abuse  Continues to smoke about 2-1/2 packs a day. Continued nicotine patch.   Counseled strongly on cessation.  Oral thrush  Ordered nystatin swish and swallow  GERD  Continue PPI  Hypertension  Resume blood pressure medications upon discharge  Hyperlipidemia  Continue statin  Obesity  Has prediabetes with A1C of 6.4.. Counseled on weight loss  Osteoarthritis  Continue home regimen  Code Status: Full code  Family Communication: sisters at bedside  Consultants:  None  Procedures:  None Antibiotics:  Levaquin for 5 more days post discharge   12/14/2013 1st [place] [facility] office visit/ Lonna Rabold/ comprehensive transition of care note     Chief Complaint  Patient presents with  . HFU    Breathing slightly better, prod cough thick creme color. using 02 PRN. was 86% RA entered exam room   has improved since arrived home and now able to go up flight of steps. Cough worst in ams Last prednisone 2/9 also last levaquin and sent home on 02 but not using  Baseline used combivent qid and prn saba "a lot"   At discharge rx duoneb qid not prn   No obvious day to day or daytime variabilty or assoc chronic cough or cp or chest tightness, subjective wheeze overt sinus or hb symptoms. No unusual exp hx or h/o childhood pna/ asthma or knowledge of premature birth.  Sleeping ok without nocturnal  or early am exacerbation  of respiratory  c/o's or need for noct saba. Also denies any obvious fluctuation of symptoms with weather or environmental changes or other aggravating or alleviating factors except as outlined above   Current Medications, Allergies, Complete Past Medical History, Past Surgical History, Family History, and Social History were reviewed in  [facility] electronic medical record.  ROS  The following are not active complaints unless bolded sore throat, dysphagia, dental problems, itching, sneezing,  nasal congestion or excess/ purulent secretions, ear ache,   fever, chills, sweats, unintended wt loss, pleuritic or exertional cp, hemoptysis,  orthopnea pnd  or leg swelling, presyncope, palpitations, heartburn, abdominal pain, anorexia, nausea, vomiting, diarrhea  or change in bowel or urinary habits, change in stools or urine, dysuria,hematuria,  rash, arthralgias, visual complaints, headache, numbness weakness or ataxia or problems with walking or coordination,  change in mood/affect or memory.        Review of Systems     Objective:   Physical Exam    amb wf nad  Wt Readings from Last 3 Encounters:  12/14/13 217 lb 12.8 oz (98.793 kg)  12/04/13 223 lb 1.7 oz (101.2 kg)      HEENT mild turbinate edema.  Oropharynx no thrush or excess pnd or cobblestoning.  No JVD or cervical adenopathy. Mild accessory muscle hypertrophy. Trachea midline, nl thryroid. Chest was hyperinflated by percussion with diminished breath sounds and moderate increased exp time without wheeze. Hoover sign positive at mid inspiration. Regular rate and rhythm without murmur gallop or rub or increase P2 or edema.  Abd: no hsm, nl excursion. Ext warm without cyanosis or clubbing.   12/05/13  No acute cardiopulmonary disease.  COPD.    Assessment:

## 2013-12-14 NOTE — Patient Instructions (Signed)
Maintaining off cigarettes is the most important aspect of your care  Plan A= automatic = combivent four times a day  Plan B= Backup = Only use your albuterol as a rescue medication to be used if you can't catch your breath by resting or doing a relaxed purse lip breathing pattern.  - The less you use it, the better it will work when you need it. - Ok to use up to 2 puffs  every 4 hours if you must but call for immediate appointment if use goes up over your usual need - Don't leave home without it !!  (think of it like the spare tire for your car)   Plan C= contingency = if start needing your backup more than usual, change plan A to duoneb 4 x daily and stop combivent  Please schedule a follow up office visit in 4 weeks, sooner if needed with pfts

## 2013-12-15 NOTE — Assessment & Plan Note (Signed)
DDX of  difficult airways managment all start with A and  include Adherence, Ace Inhibitors, Acid Reflux, Active Sinus Disease, Alpha 1 Antitripsin deficiency, Anxiety masquerading as Airways dz,  ABPA,  allergy(esp in young), Aspiration (esp in elderly), Adverse effects of DPI,  Active smokers, plus two Bs  = Bronchiectasis and Beta blocker use..and one C= CHF   Adherence is always the initial "prime suspect" and is a multilayered concern that requires a "trust but verify" approach in every patient - starting with knowing how to use medications, especially inhalers, correctly, keeping up with refills and understanding the fundamental difference between maintenance and prns vs those medications only taken for a very short course and then stopped and not refilled.  - The proper method of use, as well as anticipated side effects, of a metered-dose inhaler are discussed and demonstrated to the patient. Improved effectiveness after extensive coaching during this visit to a level of approximately  75% - duoneb should be qid prn, not maint for now - combivent should be adequate as maint as long as she doesn't smoke  ?Active smoking > emphasized importance of complete abstinence at this point     Each maintenance medication was reviewed in detail including most importantly the difference between maintenance and as needed and under what circumstances the prns are to be used.  Please see instructions for details which were reviewed in writing and the patient given a copy.

## 2013-12-15 NOTE — Assessment & Plan Note (Signed)
Placed on 02 at discharge 12/08/13   rec she continue to use 02 prn with the hope off cigarettes she will improve to where not needing chronically but for now err on side of using it.

## 2014-01-13 ENCOUNTER — Other Ambulatory Visit: Payer: Self-pay | Admitting: Internal Medicine

## 2014-01-13 DIAGNOSIS — J449 Chronic obstructive pulmonary disease, unspecified: Secondary | ICD-10-CM

## 2014-01-14 ENCOUNTER — Encounter: Payer: Self-pay | Admitting: Internal Medicine

## 2014-01-14 ENCOUNTER — Ambulatory Visit (INDEPENDENT_AMBULATORY_CARE_PROVIDER_SITE_OTHER): Payer: Medicare Other | Admitting: Internal Medicine

## 2014-01-14 VITALS — BP 144/72 | HR 91 | Ht 65.25 in | Wt 227.0 lb

## 2014-01-14 DIAGNOSIS — J9610 Chronic respiratory failure, unspecified whether with hypoxia or hypercapnia: Secondary | ICD-10-CM

## 2014-01-14 DIAGNOSIS — J449 Chronic obstructive pulmonary disease, unspecified: Secondary | ICD-10-CM

## 2014-01-14 LAB — PULMONARY FUNCTION TEST
DL/VA % pred: 82 %
DL/VA: 4.1 ml/min/mmHg/L
DLCO UNC % PRED: 64 %
DLCO UNC: 16.64 ml/min/mmHg
FEF 25-75 POST: 0.83 L/s
FEF 25-75 PRE: 0.49 L/s
FEF2575-%Change-Post: 71 %
FEF2575-%Pred-Post: 41 %
FEF2575-%Pred-Pre: 24 %
FEV1-%Change-Post: 17 %
FEV1-%PRED-POST: 61 %
FEV1-%Pred-Pre: 52 %
FEV1-Post: 1.49 L
FEV1-Pre: 1.26 L
FEV1FVC-%Change-Post: 4 %
FEV1FVC-%Pred-Pre: 70 %
FEV6-%Change-Post: 14 %
FEV6-%PRED-PRE: 74 %
FEV6-%Pred-Post: 84 %
FEV6-POST: 2.57 L
FEV6-Pre: 2.24 L
FEV6FVC-%CHANGE-POST: 1 %
FEV6FVC-%PRED-POST: 100 %
FEV6FVC-%Pred-Pre: 99 %
FVC-%Change-Post: 13 %
FVC-%PRED-POST: 83 %
FVC-%Pred-Pre: 74 %
FVC-PRE: 2.34 L
FVC-Post: 2.65 L
PRE FEV6/FVC RATIO: 96 %
Post FEV1/FVC ratio: 56 %
Post FEV6/FVC ratio: 97 %
Pre FEV1/FVC ratio: 54 %
RV % PRED: 126 %
RV: 2.84 L
TLC % pred: 103 %
TLC: 5.41 L

## 2014-01-14 NOTE — Patient Instructions (Addendum)
Maintaining off cigarettes is the most important aspect of your care  Use combivent one puff first thing in am and then every 4-6 h as needed and if not effective ok to change to duoneb but call for early appt  Ok to stop 02 - order placed today  Please schedule a follow up visit in 3 months but call sooner if needed

## 2014-01-14 NOTE — Progress Notes (Signed)
PFT done today. 

## 2014-01-14 NOTE — Progress Notes (Signed)
Subjective:     Patient ID: Miyo Aina, female   DOB: 13-Apr-1946  MRN: [id number]  HPI  60 yowf  Quit smoking 1/230/15 and referred by [facility]   12/14/2013 in pulmonary clinic  for first time s/p hospitalization for copd and proved to have GOLD II by pfts 01/14/14   Admit date: 12/03/2013  Discharge date: 12/08/2013  Recommendations for Outpatient Follow-up:  1. Pt will need to follow up with PCP and appointment scheduled Feb 18 th, 2015 at 11 am 2. Please obtain BMP to evaluate electrolytes and kidney function 3. Please also check CBC to evaluate Hg and Hct levels 4. Pt discharge on Prednisone taper pack starting with 50 mg tablet daily 5. Levaquin 5 days upon discharge prescribed 6. Pt given nebulizer machine upon discharge  7. Pt also discharge on oxygen 4-5 liters continuous use  8. Appointment with pulmonologist Dr. Melvyn Novas scheduled February 10th, 2015 at 9 am Discharge Diagnoses: Acute hypoxic respiratory failure secondary to COPD exacerbation  Principal Problem:  COPD exacerbation  Active Problems:  HTN (hypertension)  HLD (hyperlipidemia)  Obesity  Tobacco abuse  GERD (gastroesophageal reflux disease)  OA (osteoarthritis)  Acute respiratory failure with hypoxia     12/14/2013 1st [facility] office visit/ Camay Pedigo/ comprehensive transition of care note     Chief Complaint  Patient presents with  . HFU    Breathing slightly better, prod cough thick creme color. using 02 PRN. was 86% RA entered exam room   has improved since arrived home and now able to go up flight of steps. Cough worst in ams Last prednisone 2/9 also last levaquin and sent home on 02 but not using  Baseline used combivent qid and prn saba "a lot"   At discharge rx duoneb qid not prn  rec Maintaining off cigarettes is the most important aspect of your care Plan A= automatic = combivent four times a day. Plan B = duoneb prn   01/14/2014 f/u ov/Damon Baisch re: COPD GOLD II with reversibility  Chief Complaint   Patient presents with  . Follow-up    review pft.  Pt c/o SOB with exertion.     still using combivent qid, no extra rx.  No obvious day to day or daytime variabilty or assoc chronic cough or cp or chest tightness, subjective wheeze overt sinus or hb symptoms. No unusual exp hx or h/o childhood pna/ asthma or knowledge of premature birth.  Sleeping ok without nocturnal  or early am exacerbation  of respiratory  c/o's or need for noct saba. Also denies any obvious fluctuation of symptoms with weather or environmental changes or other aggravating or alleviating factors except as outlined above   Current Medications, Allergies, Complete Past Medical History, Past Surgical History, Family History, and Social History were reviewed in [employer] record.  ROS  The following are not active complaints unless bolded sore throat, dysphagia, dental problems, itching, sneezing,  nasal congestion or excess/ purulent secretions, ear ache,   fever, chills, sweats, unintended wt loss, pleuritic or exertional cp, hemoptysis,  orthopnea pnd or leg swelling, presyncope, palpitations, heartburn, abdominal pain, anorexia, nausea, vomiting, diarrhea  or change in bowel or urinary habits, change in stools or urine, dysuria,hematuria,  rash, arthralgias, visual complaints, headache, numbness weakness or ataxia or problems with walking or coordination,  change in mood/affect or memory.              Objective:   Physical Exam    amb wf nad 01/14/2014  227  Wt Readings from Last 3 Encounters:  12/14/13 217 lb 12.8 oz (98.793 kg)  12/04/13 223 lb 1.7 oz (101.2 kg)      HEENT mild turbinate edema.  Oropharynx no thrush or excess pnd or cobblestoning.  No JVD or cervical adenopathy. Mild accessory muscle hypertrophy. Trachea midline, nl thryroid. Chest was hyperinflated by percussion with diminished breath sounds and moderate increased exp time without wheeze. Hoover sign positive at mid  inspiration. Regular rate and rhythm without murmur gallop or rub or increase P2 or edema.  Abd: no hsm, nl excursion. Ext warm without cyanosis or clubbing.   12/05/13  No acute cardiopulmonary disease.  COPD.    Assessment:

## 2014-01-15 NOTE — Assessment & Plan Note (Signed)
Placed on 02 at discharge 12/08/13  > d/c 01/14/2014   With dlco > 60 should not find she needs 02 and hasn't used in weeks so ok to d/c permanently

## 2014-01-15 NOTE — Assessment & Plan Note (Addendum)
-   Quit smoking 12/02/13  - HFA 75% p coaching 12/14/13 - PFT's 01/14/2014  FEV1 1.26 (52%) ratio 54 with 17% improvement and dlco  64 with 82 corrected   She's happy with combivent and based on severity of dz can just use it prn with the other options being lama and prn saba should she have more limiting symptoms or increase need for rescue and ics/laba instead if more tendency to AB exacerbations  Also I reviewed the Flethcher curve with patient that basically indicates  if you quit smoking when your best day FEV1 is still well preserved (which is her case)  it is highly unlikely you will progress to severe disease and informed the patient there was no medication on the market that has proven to change the curve or the likelihood of progression.  Therefore stopping   maintaining abstinence is the most important aspect of care, not choice of inhalers or for that matter, doctors.

## 2014-04-15 ENCOUNTER — Encounter: Payer: Self-pay | Admitting: Internal Medicine

## 2014-04-15 ENCOUNTER — Ambulatory Visit (INDEPENDENT_AMBULATORY_CARE_PROVIDER_SITE_OTHER): Payer: Medicare Other | Admitting: Internal Medicine

## 2014-04-15 VITALS — BP 136/72 | HR 96 | Temp 98.8°F | Ht 65.25 in | Wt 243.0 lb

## 2014-04-15 DIAGNOSIS — J449 Chronic obstructive pulmonary disease, unspecified: Secondary | ICD-10-CM

## 2014-04-15 MED ORDER — PREDNISONE 10 MG PO TABS: ORAL_TABLET | ORAL | Status: DC

## 2014-04-15 MED ORDER — BUDESONIDE-FORMOTEROL FUMARATE 160-4.5 MCG/ACT IN AERO: INHALATION_SPRAY | RESPIRATORY_TRACT | Status: DC

## 2014-04-15 NOTE — Patient Instructions (Addendum)
Nexium 40 mg Take 30-60 min before first meal of the day and Pepcid ac 20 mg at bedtime   Plan A = Symbicort 160 Take 2 puffs first thing in am and then another 2 puffs about 12 hours later.   Plan B back = combivent one every 4-6 hours as needed   Plan C = backup to Plan B = every 4 hours if needed   Prednisone 10 mg take  4 each am x 2 days,   2 each am x 2 days,  1 each am x 2 days and stop   Please schedule a follow up office visit in 4 weeks, sooner if needed with medication formulary from your insurance company.

## 2014-04-15 NOTE — Progress Notes (Signed)
Subjective:     Patient ID: [name], female   DOB: 1946-06-12  MRN: [id number]  HPI  49 yowf  Quit smoking 1/230/15 and referred by [facility]   12/14/2013 in pulmonary clinic  for first time s/p hospitalization for copd and proved to have GOLD II by pfts 01/14/14   Admit date: 12/03/2013  Discharge date: 12/08/2013  Recommendations for Outpatient Follow-up:  1. Pt will need to follow up with PCP and appointment scheduled Feb 18 th, 2015 at 11 am 2. Please obtain BMP to evaluate electrolytes and kidney function 3. Please also check CBC to evaluate Hg and Hct levels 4. Pt discharge on Prednisone taper pack starting with 50 mg tablet daily 5. Levaquin 5 days upon discharge prescribed 6. Pt given nebulizer machine upon discharge  7. Pt also discharge on oxygen 4-5 liters continuous use  8. Appointment with pulmonologist Dr. Melvyn Novas scheduled February 10th, 2015 at 9 am Discharge Diagnoses: Acute hypoxic respiratory failure secondary to COPD exacerbation  Principal Problem:  COPD exacerbation  Active Problems:  HTN (hypertension)  HLD (hyperlipidemia)  Obesity  Tobacco abuse  GERD (gastroesophageal reflux disease)  OA (osteoarthritis)  Acute respiratory failure with hypoxia     12/14/2013 1st [facility] office visit/ Jovante Hammitt/ comprehensive transition of care note     Chief Complaint  Patient presents with  . HFU    Breathing slightly better, prod cough thick creme color. using 02 PRN. was 86% RA entered exam room   has improved since arrived home and now able to go up flight of steps. Cough worst in ams Last prednisone 2/9 also last levaquin and sent home on 02 but not using  Baseline used combivent qid and prn saba "a lot"   At discharge rx duoneb qid not prn  rec Maintaining off cigarettes is the most important aspect of your care Plan A= automatic = combivent four times a day. Plan B = duoneb prn   01/14/2014 f/u ov/Erika Hussar re: COPD GOLD II with reversibility  Chief Complaint   Patient presents with  . Follow-up    review pft.  Pt c/o SOB with exertion.     still using combivent qid, no extra rx. rec  Maintaining off cigarettes is the most important aspect of your care Use combivent one puff first thing in am and then every 4-6 h as needed and if not effective ok to change to duoneb but call for early appt Ok to stop 02    04/15/2014 f/u ov/Muriel Wilber re: COPD GOLD II/ obesity Chief Complaint  Patient presents with  . Follow-up    Pt c/o SOB with exertion, worse with warm weather.  Is using e-cig on occasion.  ever somce last ov has had difficulty in am with congestion/wheezing using combivent   first thing and then muliple uses of combivent and duoneb during the day . Some congested coughing in am but mucoid x maybe a tbsp. Takes up to an hour to get relief in am   No obvious day to day or daytime variabilty or assoc purulent production or cp or chest tightness, subjective wheeze overt sinus or hb symptoms. No unusual exp hx or h/o childhood pna/ asthma or knowledge of premature birth.  Sleeping ok without nocturnal  or early am exacerbation  of respiratory  c/o's or need for noct saba. Also denies any obvious fluctuation of symptoms with weather or environmental changes or other aggravating or alleviating factors except as outlined above   Current Medications, Allergies, Complete  Past Medical History, Past Surgical History, Family History, and Social History were reviewed in [employer] record.  ROS  The following are not active complaints unless bolded sore throat, dysphagia, dental problems, itching, sneezing,  nasal congestion or excess/ purulent secretions, ear ache,   fever, chills, sweats, unintended wt loss, pleuritic or exertional cp, hemoptysis,  orthopnea pnd or leg swelling, presyncope, palpitations, heartburn, abdominal pain, anorexia, nausea, vomiting, diarrhea  or change in bowel or urinary habits, change in stools or urine,  dysuria,hematuria,  rash, arthralgias, visual complaints, headache, numbness weakness or ataxia or problems with walking or coordination,  change in mood/affect or memory.              Objective:   Physical Exam  amb wf nad  01/14/2014        227 > 04/15/2014  243 Wt Readings from Last 3 Encounters:  12/14/13 217 lb 12.8 oz (98.793 kg)  12/04/13 223 lb 1.7 oz (101.2 kg)      HEENT mild turbinate edema.  Oropharynx no thrush or excess pnd or cobblestoning.  No JVD or cervical adenopathy. Mild accessory muscle hypertrophy. Trachea midline, nl thryroid. Chest was hyperinflated by percussion with diminished breath sounds and moderate increased exp time without wheeze. Hoover sign positive at mid inspiration. Regular rate and rhythm without murmur gallop or rub or increase P2 or edema.  Abd: no hsm, nl excursion. Ext warm without cyanosis or clubbing.   12/05/13  No acute cardiopulmonary disease.  COPD.    Assessment:

## 2014-04-16 NOTE — Assessment & Plan Note (Addendum)
-   Quit smoking 12/02/13   - PFT's 01/14/2014  FEV1 1.26 (52%) ratio 54 with 17% improvement and dlco  64 with 82 corrected   DDX of  difficult airways management all start with A and  include Adherence, Ace Inhibitors, Acid Reflux, Active Sinus Disease, Alpha 1 Antitripsin deficiency, Anxiety masquerading as Airways dz,  ABPA,  allergy(esp in young), Aspiration (esp in elderly), Adverse effects of DPI,  Active smokers, plus two Bs  = Bronchiectasis and Beta blocker use..and one C= CHF  Adherence is always the initial "prime suspect" and is a multilayered concern that requires a "trust but verify" approach in every patient - starting with knowing how to use medications, especially inhalers, correctly, keeping up with refills and understanding the fundamental difference between maintenance and prns vs those medications only taken for a very short course and then stopped and not refilled.  The proper method of use, as well as anticipated side effects, of a metered-dose inhaler are discussed and demonstrated to the patient. Improved effectiveness after extensive coaching during this visit to a level of approximately  50% so try symbicort 160 2bid and change combivent to prn and duoneb as backup for combivent  ? Acid (or non-acid) GERD > always difficult to exclude as up to 75% of pts in some series report no assoc GI/ Heartburn symptoms> rec max (24h)  acid suppression by adding pepcid at hs  ? Allergy/asthmatic component > Prednisone 10 mg take  4 each am x 2 days,   2 each am x 2 days,  1 each am x 2 days and stop     Each maintenance medication was reviewed in detail including most importantly the difference between maintenance and as needed and under what circumstances the prns are to be used.  Please see instructions for details which were reviewed in writing and the patient given a copy.

## 2014-05-13 ENCOUNTER — Ambulatory Visit (INDEPENDENT_AMBULATORY_CARE_PROVIDER_SITE_OTHER): Payer: Medicare Other | Admitting: Internal Medicine

## 2014-05-13 ENCOUNTER — Encounter: Payer: Self-pay | Admitting: Internal Medicine

## 2014-05-13 VITALS — BP 142/74 | HR 90 | Temp 98.4°F | Ht 67.0 in | Wt 244.0 lb

## 2014-05-13 DIAGNOSIS — J449 Chronic obstructive pulmonary disease, unspecified: Secondary | ICD-10-CM

## 2014-05-13 NOTE — Patient Instructions (Addendum)
Work on inhaler technique:  relax and gently blow all the way out then take a nice smooth deep breath back in, triggering the inhaler at same time you start breathing in.  Hold for up to 5 seconds if you can.  Rinse and gargle with water when done  Please schedule a follow up visit in 3 months but call sooner if needed .

## 2014-05-13 NOTE — Progress Notes (Signed)
Subjective:     Patient ID: [name], female   DOB: 1946/06/04  MRN: [id number]    Brief patient profile:  19 yowf  Quit smoking 1/230/15 and referred by [facility]   12/14/2013 in pulmonary clinic  for first time s/p hospitalization for copd and proved to have GOLD II by pfts 01/14/14    History of Present Illness   Admit date: 12/03/2013  Discharge date: 12/08/2013  Recommendations for Outpatient Follow-up:  1. Pt will need to follow up with PCP and appointment scheduled Feb 18 th, 2015 at 11 am 2. Please obtain BMP to evaluate electrolytes and kidney function 3. Please also check CBC to evaluate Hg and Hct levels 4. Pt discharge on Prednisone taper pack starting with 50 mg tablet daily 5. Levaquin 5 days upon discharge prescribed 6. Pt given nebulizer machine upon discharge  7. Pt also discharge on oxygen 4-5 liters continuous use  8. Appointment with pulmonologist Dr. Melvyn Novas scheduled February 10th, 2015 at 9 am Discharge Diagnoses: Acute hypoxic respiratory failure secondary to COPD exacerbation  Principal Problem:  COPD exacerbation  Active Problems:  HTN (hypertension)  HLD (hyperlipidemia)  Obesity  Tobacco abuse  GERD (gastroesophageal reflux disease)  OA (osteoarthritis)  Acute respiratory failure with hypoxia     12/14/2013 1st [facility] office visit/ [name]/ comprehensive transition of care note     Chief Complaint  Patient presents with  . HFU    Breathing slightly better, prod cough thick creme color. using 02 PRN. was 86% RA entered exam room   has improved since arrived home and now able to go up flight of steps. Cough worst in ams Last prednisone 2/9 also last levaquin and sent home on 02 but not using  Baseline used combivent qid and prn saba "a lot"   At discharge rx duoneb qid not prn  rec Maintaining off cigarettes is the most important aspect of your care Plan A= automatic = combivent four times a day. Plan B = duoneb prn   01/14/2014 f/u ov/[name] re:  COPD GOLD II with reversibility  Chief Complaint  Patient presents with  . Follow-up    review pft.  Pt c/o SOB with exertion.     still using combivent qid, no extra rx. rec  Maintaining off cigarettes is the most important aspect of your care Use combivent one puff first thing in am and then every 4-6 h as needed and if not effective ok to change to duoneb but call for early appt Ok to stop 02    04/15/2014 f/u ov/[name] re: COPD GOLD II/ obesity Chief Complaint  Patient presents with  . Follow-up    Pt c/o SOB with exertion, worse with warm weather.  Is using e-cig on occasion.  ever since last ov has had difficulty in am with congestion/wheezing using combivent   first thing and then muliple uses of combivent and duoneb during the day . Some congested coughing in am but mucoid x maybe a tbsp. Takes up to an hour to get relief in am  rec Nexium 40 mg Take 30-60 min before first meal of the day and Pepcid ac 20 mg at bedtime  Plan A = Symbicort 160 Take 2 puffs first thing in am and then another 2 puffs about 12 hours later.  Plan B back = combivent one every 4-6 hours as needed  Plan C = backup to Plan B = nebulizer every 4 hours if needed  Prednisone 10 mg take  4  each am x 2 days,   2 each am x 2 days,  1 each am x 2 days and stop     05/13/2014 f/u ov/[name] re: GOLD II copd symb 160 2bid  maint  Chief Complaint  Patient presents with  . Follow-up    Pt states taking te symbicort has helped her breathing. Pt states her breathing has improved but the days when the weather is hot makes breathing worse. Pt states she has morning mucous that takes a while to bring up. Denies CP/tightness.     Not limited by breathing from desired activities   Never need saba neb, rare hfa saba    No obvious day to day or daytime variabilty or assoc purulent production or cp or chest tightness, subjective wheeze overt sinus or hb symptoms. No unusual exp hx or h/o childhood pna/ asthma or knowledge of  premature birth.  Sleeping ok without nocturnal  or early am exacerbation  of respiratory  c/o's or need for noct saba. Also denies any obvious fluctuation of symptoms with weather or environmental changes or other aggravating or alleviating factors except as outlined above   Current Medications, Allergies, Complete Past Medical History, Past Surgical History, Family History, and Social History were reviewed in [employer] record.  ROS  The following are not active complaints unless bolded sore throat, dysphagia, dental problems, itching, sneezing,  nasal congestion or excess/ purulent secretions, ear ache,   fever, chills, sweats, unintended wt loss, pleuritic or exertional cp, hemoptysis,  orthopnea pnd or leg swelling, presyncope, palpitations, heartburn, abdominal pain, anorexia, nausea, vomiting, diarrhea  or change in bowel or urinary habits, change in stools or urine, dysuria,hematuria,  rash, arthralgias, visual complaints, headache, numbness weakness or ataxia or problems with walking or coordination,  change in mood/affect or memory.              Objective:   Physical Exam  amb wf nad  01/14/2014        227 > 04/15/2014  243 > 05/13/2014 244  Wt Readings from Last 3 Encounters:  12/14/13 217 lb 12.8 oz (98.793 kg)  12/04/13 223 lb 1.7 oz (101.2 kg)      HEENT mild turbinate edema.  Oropharynx no thrush or excess pnd or cobblestoning.  No JVD or cervical adenopathy. Mild accessory muscle hypertrophy. Trachea midline, nl thryroid. Chest was hyperinflated by percussion with diminished breath sounds and moderate increased exp time without wheeze. Hoover sign positive at mid inspiration. Regular rate and rhythm without murmur gallop or rub or increase P2 or edema.  Abd: no hsm, nl excursion. Ext warm without cyanosis or clubbing.   12/05/13  No acute cardiopulmonary disease.  COPD.    Assessment:

## 2014-05-14 NOTE — Assessment & Plan Note (Signed)
-   Quit smoking 12/02/13  - PFT's 01/14/2014  FEV1 1.26 (52%) ratio 54 with 17% improvement and dlco  64 with 82 corrected  - 04/15/2014   try symbicort 160 Take 2 puffs first thing in am and then another 2 puffs about 12 hours later  The proper method of use, as well as anticipated side effects, of a metered-dose inhaler are discussed and demonstrated to the patient. Improved effectiveness after extensive coaching during this visit to a level of approximately  75% so continue symbicort and work on hfa    Each maintenance medication was reviewed in detail including most importantly the difference between maintenance and as needed and under what circumstances the prns are to be used.  Please see instructions for details which were reviewed in writing and the patient given a copy.

## 2014-08-16 ENCOUNTER — Encounter: Payer: Self-pay | Admitting: Internal Medicine

## 2014-08-16 ENCOUNTER — Ambulatory Visit (INDEPENDENT_AMBULATORY_CARE_PROVIDER_SITE_OTHER): Payer: Medicare Other | Admitting: Internal Medicine

## 2014-08-16 VITALS — BP 140/70 | HR 100 | Temp 98.8°F | Ht 66.5 in | Wt 267.0 lb

## 2014-08-16 DIAGNOSIS — J449 Chronic obstructive pulmonary disease, unspecified: Secondary | ICD-10-CM

## 2014-08-16 NOTE — Patient Instructions (Addendum)
Work on inhaler technique:  relax and gently blow all the way out then take a nice smooth deep breath back in, triggering the inhaler at same time you start breathing in.  Hold for up to 5 seconds if you can.  Rinse and gargle with water when done  Please schedule a follow up visit in 6  months but call sooner if needed

## 2014-08-16 NOTE — Assessment & Plan Note (Signed)
-   Quit smoking 12/02/13  - PFT's 01/14/2014  FEV1 1.26 (52%) ratio 54 with 17% improvement and dlco  64 with 82 corrected  - 04/15/2014   try symbicort 160 Take 2 puffs first thing in am and then another 2 puffs about 12 hours later  The proper method of use, as well as anticipated side effects, of a metered-dose inhaler are discussed and demonstrated to the patient. Improved effectiveness after extensive coaching during this visit to a level of approximately  75%   Note ok control of symptoms despite poor hfa suggesting/ confirming she's actually not that bad in terms of severity of  Copd but I worry about the wt gain offsetting the benefit of smoking cessation, discussed    Each maintenance medication was reviewed in detail including most importantly the difference between maintenance and as needed and under what circumstances the prns are to be used.  Please see instructions for details which were reviewed in writing and the patient given a copy.    Obesity f/u per primary care.

## 2014-08-16 NOTE — Progress Notes (Signed)
Subjective:     Patient ID: [name], female   DOB: 16-Sep-1946  MRN: [id number]    Brief patient profile:  49  yowf  Quit smoking 1/230/15 and referred by [facility]   12/14/2013 in pulmonary clinic  for first time s/p hospitalization for copd and proved to have GOLD II by pfts 01/14/14    History of Present Illness   Admit date: 12/03/2013  Discharge date: 12/08/2013  Recommendations for Outpatient Follow-up:  1. Pt will need to follow up with PCP and appointment scheduled Feb 18 th, 2015 at 11 am 2. Please obtain BMP to evaluate electrolytes and kidney function 3. Please also check CBC to evaluate Hg and Hct levels 4. Pt discharge on Prednisone taper pack starting with 50 mg tablet daily 5. Levaquin 5 days upon discharge prescribed 6. Pt given nebulizer machine upon discharge  7. Pt also discharge on oxygen 4-5 liters continuous use  8. Appointment with pulmonologist Dr. Melvyn Novas scheduled February 10th, 2015 at 9 am Discharge Diagnoses: Acute hypoxic respiratory failure secondary to COPD exacerbation  Principal Problem:  COPD exacerbation  Active Problems:  HTN (hypertension)  HLD (hyperlipidemia)  Obesity  Tobacco abuse  GERD (gastroesophageal reflux disease)  OA (osteoarthritis)  Acute respiratory failure with hypoxia     12/14/2013 1st [facility] office visit/ Stela Iwasaki/ comprehensive transition of care note     Chief Complaint  Patient presents with  . HFU    Breathing slightly better, prod cough thick creme color. using 02 PRN. was 86% RA entered exam room   has improved since arrived home and now able to go up flight of steps. Cough worst in ams Last prednisone 2/9 also last levaquin and sent home on 02 but not using  Baseline used combivent qid and prn saba "a lot"   At discharge rx duoneb qid not prn  rec Maintaining off cigarettes is the most important aspect of your care Plan A= automatic = combivent four times a day. Plan B = duoneb prn   01/14/2014 f/u ov/Leonore Frankson re:  COPD GOLD II with reversibility  Chief Complaint  Patient presents with  . Follow-up    review pft.  Pt c/o SOB with exertion.     still using combivent qid, no extra rx. rec  Maintaining off cigarettes is the most important aspect of your care Use combivent one puff first thing in am and then every 4-6 h as needed and if not effective ok to change to duoneb but call for early appt Ok to stop 02      08/16/2014 f/u ov/Maurianna Benard re:  Girtha Rm II  Off cigs x 11/2013 on symbicort 160 and prn proair/ no neb at all  Chief Complaint  Patient presents with  . Follow-up    Pt states that her breathing is unchanged since the last visit. No new co's today. She is using rescue inhaler on average 4 x per wk.    limited more by back and legs than sob most of the time, only uses saba "if over does it"   No obvious day to day or daytime variabilty or assoc purulent production or cp or chest tightness, subjective wheeze overt sinus or hb symptoms. No unusual exp hx or h/o childhood pna/ asthma or knowledge of premature birth.  Sleeping ok without nocturnal  or early am exacerbation  of respiratory  c/o's or need for noct saba. Also denies any obvious fluctuation of symptoms with weather or environmental changes or other aggravating or alleviating  factors except as outlined above   Current Medications, Allergies, Complete Past Medical History, Past Surgical History, Family History, and Social History were reviewed in [employer] record.  ROS  The following are not active complaints unless bolded sore throat, dysphagia, dental problems, itching, sneezing,  nasal congestion or excess/ purulent secretions, ear ache,   fever, chills, sweats, unintended wt gain, pleuritic or exertional cp, hemoptysis,  orthopnea pnd or leg swelling, presyncope, palpitations, heartburn, abdominal pain, anorexia, nausea, vomiting, diarrhea  or change in bowel or urinary habits, change in stools or urine,  dysuria,hematuria,  rash, arthralgias, visual complaints, headache, numbness weakness or ataxia or problems with walking or coordination,  change in mood/affect or memory.              Objective:   Physical Exam  amb obese pleasant wf nad  01/14/2014        227 > 04/15/2014  243 > 05/13/2014 244 > 08/16/2014 267  Wt Readings from Last 3 Encounters:  12/14/13 217 lb 12.8 oz (98.793 kg)  12/04/13 223 lb 1.7 oz (101.2 kg)      HEENT mild turbinate edema.  Oropharynx no thrush or excess pnd or cobblestoning.  No JVD or cervical adenopathy. Mild accessory muscle hypertrophy. Trachea midline, nl thryroid. Chest was hyperinflated by percussion with diminished breath sounds and moderate increased exp time without wheeze. Hoover sign positive at mid inspiration. Regular rate and rhythm without murmur gallop or rub or increase P2 or edema.  Abd: no hsm, nl excursion. Ext warm without cyanosis or clubbing.   12/05/13  No acute cardiopulmonary disease.  COPD.    Assessment:

## 2015-03-09 ENCOUNTER — Ambulatory Visit (INDEPENDENT_AMBULATORY_CARE_PROVIDER_SITE_OTHER): Payer: Medicare Other | Admitting: Internal Medicine

## 2015-03-09 ENCOUNTER — Encounter: Payer: Self-pay | Admitting: Internal Medicine

## 2015-03-09 VITALS — BP 142/76 | HR 116 | Ht 65.0 in | Wt 268.0 lb

## 2015-03-09 DIAGNOSIS — J9611 Chronic respiratory failure with hypoxia: Secondary | ICD-10-CM

## 2015-03-09 DIAGNOSIS — J449 Chronic obstructive pulmonary disease, unspecified: Secondary | ICD-10-CM | POA: Diagnosis not present

## 2015-03-09 NOTE — Patient Instructions (Addendum)
Try adding pepcid 20 mg one at bedtime x one month to see if any help with nocturnal or early am respiratory problems   Please schedule a follow up visit in 6  months but call sooner if needed  lated add:  After chart review,  I really think rehab would help more than anything else > reconsider referral

## 2015-03-09 NOTE — Progress Notes (Signed)
Subjective:     Patient ID: [name], female   DOB: [birth year]  MRN: [id number]    Brief patient profile:  53  yowf  Quit smoking 11/2013   and referred by [facility]   12/14/2013 in pulmonary clinic  for first time s/p hospitalization for copd and proved to have GOLD [name] by pfts 01/14/14    History of Present Illness   Admit date: 12/03/2013  Discharge date: 12/08/2013  Discharge Diagnoses:  Acute hypoxic respiratory failure secondary to COPD exacerbation   COPD exacerbation   HTN (hypertension)  HLD (hyperlipidemia)  Obesity  Tobacco abuse  GERD (gastroesophageal reflux disease)  OA (osteoarthritis)  Acute respiratory failure with hypoxia     12/14/2013 1st [facility] office visit/ [name]/ comprehensive transition of care note     Chief Complaint  Patient presents with  . HFU    Breathing slightly better, prod cough thick creme color. using 02 PRN. was 86% RA entered exam room   has improved since arrived home and now able to go up flight of steps. Cough worst in ams Last prednisone 2/9 also last levaquin and sent home on 02 but not using  Baseline used combivent qid and prn saba "a lot"   At discharge rx duoneb qid not prn  rec Maintaining off cigarettes is the most important aspect of your care Plan A= automatic = combivent four times a day. Plan B = duoneb prn   01/14/2014 f/u ov/[name] re: COPD GOLD [name] with reversibility  Chief Complaint  Patient presents with  . Follow-up    review pft.  Pt c/o SOB with exertion.     still using combivent qid, no extra rx. rec  Maintaining off cigarettes is the most important aspect of your care Use combivent one puff first thing in am and then every 4-6 h as needed and if not effective ok to change to duoneb but call for early appt Ok to stop 02      08/16/2014 f/u ov/[name] re:  [name]  Off cigs x 11/2013 on symbicort 160 and prn proair/ no neb at all  Chief Complaint  Patient presents with  . Follow-up    Pt states that her  breathing is unchanged since the last visit. No new co's today. She is using rescue inhaler on average 4 x per wk.    limited more by back and legs than sob most of the time, only uses saba "if over does it" rec Work on inhaler technique:       03/09/2015 f/u ov/[name] re: gold [name] cigs stopped 11/2013  On symb 160 2bid and prn saba hfa/ never neb  Chief Complaint  Patient presents with  . Follow-up    SOB when warm and humid; wheezing more; using rescue inhaler 3-4 times most days, takes 1 puff instead of 2;    able to walk around [employer] leaning on basket/ [employer]c parking  / no 02  Some noct and early am dry  Cough/ subj wheeze variable maybe 3 nights a week since last ov   No obvious day to day or daytime variabilty or assoc   cp or chest tightness, subjective wheeze overt sinus or hb symptoms. No unusual exp hx or h/o childhood pna/ asthma or knowledge of premature birth.    Also denies any obvious fluctuation of symptoms with weather or environmental changes or other aggravating or alleviating factors except as outlined above   Current Medications, Allergies, Complete Past Medical History, Past  Surgical History, Family History, and Social History were reviewed in [employer] record.  ROS  The following are not active complaints unless bolded sore throat, dysphagia, dental problems, itching, sneezing,  nasal congestion or excess/ purulent secretions, ear ache,   fever, chills, sweats, unintended wt gain, pleuritic or exertional cp, hemoptysis,  orthopnea pnd or leg swelling, presyncope, palpitations, heartburn, abdominal pain, anorexia, nausea, vomiting, diarrhea  or change in bowel or urinary habits, change in stools or urine, dysuria,hematuria,  rash, arthralgias, visual complaints, headache, numbness weakness or ataxia or problems with walking or coordination,  change in mood/affect or memory.              Objective:   Physical Exam  amb obese pleasant wf nad last  saba 2 h  prior to OV    01/14/2014        227 > 04/15/2014  243 > 05/13/2014 244 > 08/16/2014 267 >  03/09/2015 268  Wt Readings from Last 3 Encounters:  12/14/13 217 lb 12.8 oz (98.793 kg)  12/04/13 223 lb 1.7 oz (101.2 kg)      HEENT mild turbinate edema.  Oropharynx no thrush or excess pnd or cobblestoning.  No JVD or cervical adenopathy. Mild accessory muscle hypertrophy. Trachea midline, nl thryroid. Chest was hyperinflated by percussion with diminished breath sounds and moderate increased exp time without wheeze. Hoover sign positive at mid inspiration. Regular rate and rhythm without murmur gallop or rub or increase P2 or edema.  Abd: no hsm, nl excursion. Ext warm without cyanosis or clubbing.     12/05/13  No acute cardiopulmonary disease.  COPD    Assessment:

## 2015-03-13 ENCOUNTER — Telehealth: Payer: Self-pay | Admitting: *Deleted

## 2015-03-13 ENCOUNTER — Encounter: Payer: Self-pay | Admitting: Internal Medicine

## 2015-03-13 NOTE — Assessment & Plan Note (Addendum)
-   Quit smoking 12/02/13  - PFT's 01/14/2014  FEV1 1.26 (52%) ratio 54 with 17% improvement and dlco  64 with 82 corrected  - 04/15/2014   try symbicort 160 Take 2 puffs first thing in am and then another 2 puffs about 12 hours later - 03/09/2015 p extensive coaching HFA effectiveness =   90%  - 03/09/2015   Walked RA x one lap @ 185 stopped due to  Sob/ sats ok/ moderate pace   I had an extended discussion with the patient reviewing all relevant studies completed to date and  lasting 15 to 20 minutes of a 25 minute visit on the following ongoing concerns:  1) she has GOLD II/III severity but also sign wt gain / deconditioning are  limiting her activity tol  2) offered rehab but declined for now but will keep trying as I think this would help more than more meds   3) The proper method of use, as well as anticipated side effects, of a metered-dose inhaler are discussed and demonstrated to the patient. Improved effectiveness after extensive coaching during this visit to a level of approximately  90% from a basline of about 50% so could do better if used meds consistently/ effectively as presribed   4) noct and early am symptoms could be gerd or obesity related > elevating hob and adding pepcid 20 mg ac at hs may help  4) Each maintenance medication was reviewed in detail including most importantly the difference between maintenance and as needed and under what circumstances the prns are to be used.  Please see instructions for details which were reviewed in writing and the patient given a copy.

## 2015-03-13 NOTE — Telephone Encounter (Signed)
-----   Message from Tanda Rockers, MD sent at 03/13/2015  6:18 AM EDT -----  After chart review,  I really think rehab would help more than anything else > reconsider referral

## 2015-03-13 NOTE — Telephone Encounter (Signed)
Spoke with pt.  Discussed below recs per MW.  Pt verbalized understanding but states she doesn't feel she can do rehab at this time because of problems she has with arthritis in her back.  She is requesting this information be sent to [facility] as Juluis Rainier.

## 2015-03-13 NOTE — Telephone Encounter (Signed)
Pt returned call [phone]

## 2015-03-13 NOTE — Assessment & Plan Note (Addendum)
Clearly no longer needs daytime 02 based on today 's walking sats

## 2015-03-13 NOTE — Telephone Encounter (Signed)
LMTCB

## 2015-05-22 ENCOUNTER — Other Ambulatory Visit: Payer: Self-pay | Admitting: Internal Medicine

## 2015-09-11 ENCOUNTER — Ambulatory Visit: Payer: Medicare Other | Admitting: Internal Medicine

## 2015-11-27 ENCOUNTER — Other Ambulatory Visit: Payer: Self-pay | Admitting: Internal Medicine

## 2016-09-24 ENCOUNTER — Encounter (HOSPITAL_COMMUNITY): Payer: Self-pay | Admitting: Emergency Medicine

## 2016-09-24 ENCOUNTER — Emergency Department (HOSPITAL_COMMUNITY): Payer: Medicare Other

## 2016-09-24 ENCOUNTER — Inpatient Hospital Stay (HOSPITAL_COMMUNITY): Payer: Medicare Other

## 2016-09-24 ENCOUNTER — Inpatient Hospital Stay (HOSPITAL_COMMUNITY)
Admission: EM | Admit: 2016-09-24 | Discharge: 2016-10-08 | DRG: 180 | Disposition: A | Discharge status: Home Health | Payer: Medicare Other | Attending: Internal Medicine | Admitting: Internal Medicine

## 2016-09-24 DIAGNOSIS — Z7952 Long term (current) use of systemic steroids: Secondary | ICD-10-CM

## 2016-09-24 DIAGNOSIS — Z7951 Long term (current) use of inhaled steroids: Secondary | ICD-10-CM

## 2016-09-24 DIAGNOSIS — Z79899 Other long term (current) drug therapy: Secondary | ICD-10-CM | POA: Diagnosis not present

## 2016-09-24 DIAGNOSIS — J449 Chronic obstructive pulmonary disease, unspecified: Secondary | ICD-10-CM | POA: Diagnosis not present

## 2016-09-24 DIAGNOSIS — E041 Nontoxic single thyroid nodule: Secondary | ICD-10-CM | POA: Diagnosis not present

## 2016-09-24 DIAGNOSIS — R911 Solitary pulmonary nodule: Secondary | ICD-10-CM | POA: Diagnosis not present

## 2016-09-24 DIAGNOSIS — E042 Nontoxic multinodular goiter: Secondary | ICD-10-CM | POA: Diagnosis present

## 2016-09-24 DIAGNOSIS — D72829 Elevated white blood cell count, unspecified: Secondary | ICD-10-CM | POA: Diagnosis not present

## 2016-09-24 DIAGNOSIS — I10 Essential (primary) hypertension: Secondary | ICD-10-CM | POA: Diagnosis present

## 2016-09-24 DIAGNOSIS — E876 Hypokalemia: Secondary | ICD-10-CM | POA: Diagnosis present

## 2016-09-24 DIAGNOSIS — J9621 Acute and chronic respiratory failure with hypoxia: Secondary | ICD-10-CM | POA: Diagnosis present

## 2016-09-24 DIAGNOSIS — J9601 Acute respiratory failure with hypoxia: Secondary | ICD-10-CM | POA: Diagnosis not present

## 2016-09-24 DIAGNOSIS — M069 Rheumatoid arthritis, unspecified: Secondary | ICD-10-CM | POA: Diagnosis present

## 2016-09-24 DIAGNOSIS — J181 Lobar pneumonia, unspecified organism: Secondary | ICD-10-CM

## 2016-09-24 DIAGNOSIS — J440 Chronic obstructive pulmonary disease with acute lower respiratory infection: Secondary | ICD-10-CM | POA: Diagnosis present

## 2016-09-24 DIAGNOSIS — R079 Chest pain, unspecified: Secondary | ICD-10-CM | POA: Diagnosis present

## 2016-09-24 DIAGNOSIS — Z87891 Personal history of nicotine dependence: Secondary | ICD-10-CM

## 2016-09-24 DIAGNOSIS — J189 Pneumonia, unspecified organism: Secondary | ICD-10-CM | POA: Diagnosis present

## 2016-09-24 DIAGNOSIS — Z853 Personal history of malignant neoplasm of breast: Secondary | ICD-10-CM | POA: Diagnosis not present

## 2016-09-24 DIAGNOSIS — G8929 Other chronic pain: Secondary | ICD-10-CM | POA: Diagnosis present

## 2016-09-24 DIAGNOSIS — Z923 Personal history of irradiation: Secondary | ICD-10-CM | POA: Diagnosis not present

## 2016-09-24 DIAGNOSIS — R0602 Shortness of breath: Secondary | ICD-10-CM

## 2016-09-24 DIAGNOSIS — J910 Malignant pleural effusion: Secondary | ICD-10-CM | POA: Diagnosis present

## 2016-09-24 DIAGNOSIS — J90 Pleural effusion, not elsewhere classified: Secondary | ICD-10-CM

## 2016-09-24 DIAGNOSIS — M549 Dorsalgia, unspecified: Secondary | ICD-10-CM | POA: Diagnosis present

## 2016-09-24 DIAGNOSIS — Z9889 Other specified postprocedural states: Secondary | ICD-10-CM | POA: Diagnosis not present

## 2016-09-24 DIAGNOSIS — I2699 Other pulmonary embolism without acute cor pulmonale: Secondary | ICD-10-CM | POA: Diagnosis not present

## 2016-09-24 DIAGNOSIS — C3490 Malignant neoplasm of unspecified part of unspecified bronchus or lung: Secondary | ICD-10-CM | POA: Diagnosis not present

## 2016-09-24 DIAGNOSIS — C3491 Malignant neoplasm of unspecified part of right bronchus or lung: Secondary | ICD-10-CM | POA: Diagnosis present

## 2016-09-24 DIAGNOSIS — C3431 Malignant neoplasm of lower lobe, right bronchus or lung: Secondary | ICD-10-CM | POA: Diagnosis not present

## 2016-09-24 DIAGNOSIS — Z801 Family history of malignant neoplasm of trachea, bronchus and lung: Secondary | ICD-10-CM

## 2016-09-24 DIAGNOSIS — R0902 Hypoxemia: Secondary | ICD-10-CM

## 2016-09-24 DIAGNOSIS — R531 Weakness: Secondary | ICD-10-CM | POA: Diagnosis not present

## 2016-09-24 DIAGNOSIS — J9600 Acute respiratory failure, unspecified whether with hypoxia or hypercapnia: Secondary | ICD-10-CM | POA: Diagnosis not present

## 2016-09-24 LAB — BASIC METABOLIC PANEL
Anion gap: 9 (ref 5–15)
BUN: 10 mg/dL (ref 6–20)
CALCIUM: 9.2 mg/dL (ref 8.9–10.3)
CO2: 31 mmol/L (ref 22–32)
Chloride: 99 mmol/L — ABNORMAL LOW (ref 101–111)
Creatinine, Ser: 0.72 mg/dL (ref 0.44–1.00)
GFR calc Af Amer: >60 mL/min (ref >60)
GLUCOSE: 177 mg/dL — AB (ref 65–99)
Potassium: 3.6 mmol/L (ref 3.5–5.1)
Sodium: 139 mmol/L (ref 135–145)

## 2016-09-24 LAB — CBC WITH DIFFERENTIAL/PLATELET
BASOS ABS: 0 10*3/uL (ref 0.0–0.1)
BASOS PCT: 0 %
EOS PCT: 0 %
Eosinophils Absolute: 0 10*3/uL (ref 0.0–0.7)
HCT: 39 % (ref 36.0–46.0)
Hemoglobin: 12.3 g/dL (ref 12.0–15.0)
LYMPHS PCT: 11 %
Lymphs Abs: 1.4 10*3/uL (ref 0.7–4.0)
MCH: 28.5 pg (ref 26.0–34.0)
MCHC: 31.5 g/dL (ref 30.0–36.0)
MCV: 90.5 fL (ref 78.0–100.0)
MONO ABS: 0.4 10*3/uL (ref 0.1–1.0)
Monocytes Relative: 3 %
Neutro Abs: 10.8 10*3/uL — ABNORMAL HIGH (ref 1.7–7.7)
Neutrophils Relative %: 86 %
PLATELETS: 367 10*3/uL (ref 150–400)
RBC: 4.31 MIL/uL (ref 3.87–5.11)
RDW: 16.3 % — AB (ref 11.5–15.5)
WBC: 12.6 10*3/uL — ABNORMAL HIGH (ref 4.0–10.5)

## 2016-09-24 LAB — I-STAT CG4 LACTIC ACID, ED: LACTIC ACID, VENOUS: 2.01 mmol/L — AB (ref 0.5–1.9)

## 2016-09-24 LAB — LACTIC ACID, PLASMA: Lactic Acid, Venous: 2.8 mmol/L (ref 0.5–1.9)

## 2016-09-24 LAB — D-DIMER, QUANTITATIVE (NOT AT ARMC): D DIMER QUANT: 1.52 ug{FEU}/mL — AB (ref 0.00–0.50)

## 2016-09-24 LAB — I-STAT TROPONIN, ED: Troponin i, poc: 0.01 ng/mL (ref 0.00–0.08)

## 2016-09-24 MED ORDER — IOPAMIDOL (ISOVUE-370) INJECTION 76%
100.0000 mL | Freq: Once | INTRAVENOUS | Status: AC | PRN
  Administered 2016-09-25: 100 mL via INTRAVENOUS

## 2016-09-24 MED ORDER — AZITHROMYCIN 250 MG PO TABS
500.0000 mg | ORAL_TABLET | Freq: Every day | ORAL | Status: DC
  Administered 2016-09-25 – 2016-10-01 (×7): 500 mg via ORAL
  Filled 2016-09-24 (×7): qty 2

## 2016-09-24 MED ORDER — IPRATROPIUM-ALBUTEROL 0.5-2.5 (3) MG/3ML IN SOLN
3.0000 mL | Freq: Four times a day (QID) | RESPIRATORY_TRACT | Status: DC
  Administered 2016-09-25 – 2016-09-26 (×4): 3 mL via RESPIRATORY_TRACT
  Filled 2016-09-24 (×5): qty 3

## 2016-09-24 MED ORDER — IPRATROPIUM BROMIDE 0.02 % IN SOLN
0.5000 mg | Freq: Once | RESPIRATORY_TRACT | Status: AC
  Administered 2016-09-24: 0.5 mg via RESPIRATORY_TRACT
  Filled 2016-09-24: qty 2.5

## 2016-09-24 MED ORDER — IRBESARTAN-HYDROCHLOROTHIAZIDE 300-12.5 MG PO TABS: 1.0000 | ORAL_TABLET | Freq: Every day | ORAL | Status: DC

## 2016-09-24 MED ORDER — ONDANSETRON HCL 4 MG/2ML IJ SOLN: 4.0000 mg | Freq: Four times a day (QID) | INTRAMUSCULAR | Status: DC | PRN

## 2016-09-24 MED ORDER — SODIUM CHLORIDE 0.9 % IV BOLUS (SEPSIS)
1000.0000 mL | Freq: Once | INTRAVENOUS | Status: AC
  Administered 2016-09-24: 1000 mL via INTRAVENOUS

## 2016-09-24 MED ORDER — PREGABALIN 75 MG PO CAPS
200.0000 mg | ORAL_CAPSULE | Freq: Every day | ORAL | Status: DC
  Administered 2016-09-24 – 2016-10-07 (×14): 200 mg via ORAL
  Filled 2016-09-24 (×14): qty 1

## 2016-09-24 MED ORDER — ACETAMINOPHEN 650 MG RE SUPP: 650.0000 mg | Freq: Four times a day (QID) | RECTAL | Status: DC | PRN

## 2016-09-24 MED ORDER — FOLIC ACID 1 MG PO TABS
1.0000 mg | ORAL_TABLET | Freq: Every day | ORAL | Status: DC
  Administered 2016-09-25 – 2016-10-08 (×14): 1 mg via ORAL
  Filled 2016-09-24 (×15): qty 1

## 2016-09-24 MED ORDER — DEXTROSE 5 % IV SOLN
1.0000 g | INTRAVENOUS | Status: DC
  Administered 2016-09-25 – 2016-10-01 (×7): 1 g via INTRAVENOUS
  Filled 2016-09-24 (×8): qty 10

## 2016-09-24 MED ORDER — ALBUTEROL SULFATE (2.5 MG/3ML) 0.083% IN NEBU: 2.5000 mg | INHALATION_SOLUTION | RESPIRATORY_TRACT | Status: DC | PRN

## 2016-09-24 MED ORDER — ALBUTEROL SULFATE (2.5 MG/3ML) 0.083% IN NEBU: 5.0000 mg | INHALATION_SOLUTION | Freq: Once | RESPIRATORY_TRACT | Status: DC

## 2016-09-24 MED ORDER — PREDNISONE 5 MG PO TABS
10.0000 mg | ORAL_TABLET | Freq: Every day | ORAL | Status: DC
  Administered 2016-09-25 – 2016-10-08 (×14): 10 mg via ORAL
  Filled 2016-09-24 (×15): qty 2

## 2016-09-24 MED ORDER — MOMETASONE FURO-FORMOTEROL FUM 200-5 MCG/ACT IN AERO
2.0000 | INHALATION_SPRAY | Freq: Two times a day (BID) | RESPIRATORY_TRACT | Status: DC
  Administered 2016-09-24 – 2016-10-02 (×16): 2 via RESPIRATORY_TRACT
  Filled 2016-09-24: qty 8.8

## 2016-09-24 MED ORDER — IRBESARTAN 150 MG PO TABS
300.0000 mg | ORAL_TABLET | Freq: Every day | ORAL | Status: DC
  Administered 2016-09-25 – 2016-10-08 (×14): 300 mg via ORAL
  Filled 2016-09-24 (×14): qty 2

## 2016-09-24 MED ORDER — IOPAMIDOL (ISOVUE-370) INJECTION 76%
INTRAVENOUS | Status: AC
  Filled 2016-09-24: qty 100

## 2016-09-24 MED ORDER — ONDANSETRON HCL 4 MG PO TABS: 4.0000 mg | ORAL_TABLET | Freq: Four times a day (QID) | ORAL | Status: DC | PRN

## 2016-09-24 MED ORDER — IPRATROPIUM-ALBUTEROL 0.5-2.5 (3) MG/3ML IN SOLN
3.0000 mL | RESPIRATORY_TRACT | Status: DC
  Administered 2016-09-24: 3 mL via RESPIRATORY_TRACT
  Filled 2016-09-24: qty 3

## 2016-09-24 MED ORDER — OXYCODONE-ACETAMINOPHEN 5-325 MG PO TABS
1.0000 | ORAL_TABLET | Freq: Four times a day (QID) | ORAL | Status: DC | PRN
  Administered 2016-09-25 – 2016-10-05 (×31): 1 via ORAL
  Filled 2016-09-24 (×31): qty 1

## 2016-09-24 MED ORDER — HYDROCHLOROTHIAZIDE 12.5 MG PO CAPS
12.5000 mg | ORAL_CAPSULE | Freq: Every day | ORAL | Status: DC
  Administered 2016-09-25 – 2016-10-08 (×14): 12.5 mg via ORAL
  Filled 2016-09-24 (×14): qty 1

## 2016-09-24 MED ORDER — OXYCODONE-ACETAMINOPHEN 10-325 MG PO TABS: 1.0000 | ORAL_TABLET | Freq: Four times a day (QID) | ORAL | Status: DC | PRN

## 2016-09-24 MED ORDER — DEXTROSE 5 % IV SOLN
1.0000 g | Freq: Once | INTRAVENOUS | Status: AC
  Administered 2016-09-24: 1 g via INTRAVENOUS
  Filled 2016-09-24: qty 10

## 2016-09-24 MED ORDER — AZITHROMYCIN 500 MG IV SOLR
500.0000 mg | Freq: Once | INTRAVENOUS | Status: AC
  Administered 2016-09-24: 500 mg via INTRAVENOUS
  Filled 2016-09-24: qty 500

## 2016-09-24 MED ORDER — ALBUTEROL SULFATE (2.5 MG/3ML) 0.083% IN NEBU
5.0000 mg | INHALATION_SOLUTION | Freq: Once | RESPIRATORY_TRACT | Status: AC
  Administered 2016-09-24: 5 mg via RESPIRATORY_TRACT
  Filled 2016-09-24: qty 6

## 2016-09-24 MED ORDER — ATORVASTATIN CALCIUM 40 MG PO TABS
40.0000 mg | ORAL_TABLET | Freq: Every day | ORAL | Status: DC
  Administered 2016-09-24 – 2016-10-07 (×14): 40 mg via ORAL
  Filled 2016-09-24 (×14): qty 1

## 2016-09-24 MED ORDER — SODIUM CHLORIDE 0.9 % IJ SOLN
INTRAMUSCULAR | Status: AC
  Filled 2016-09-24: qty 50

## 2016-09-24 MED ORDER — SODIUM CHLORIDE 0.9% FLUSH
3.0000 mL | Freq: Two times a day (BID) | INTRAVENOUS | Status: DC
  Administered 2016-09-23 – 2016-10-08 (×26): 3 mL via INTRAVENOUS

## 2016-09-24 MED ORDER — ACETAMINOPHEN 325 MG PO TABS: 650.0000 mg | ORAL_TABLET | Freq: Four times a day (QID) | ORAL | Status: DC | PRN

## 2016-09-24 MED ORDER — OXYCODONE HCL 5 MG PO TABS
5.0000 mg | ORAL_TABLET | Freq: Four times a day (QID) | ORAL | Status: DC | PRN
  Administered 2016-09-24 – 2016-10-05 (×32): 5 mg via ORAL
  Filled 2016-09-24 (×33): qty 1

## 2016-09-24 NOTE — H&P (Addendum)
History and Physical    [name] OXB:[id number] DOB: Feb 14, 1946 DOA: 09/24/2016  PCP: Merrilee Seashore, MD  Patient coming from:Home  Chief Complaint:right chest pain and SOB  HPI: [name] is a 70 y.o. female with medical history significant of hypertension, COPD not on home oxygen, history of right breast malignancy is status post lumpectomy and radiation therapy about 30 years ago, ex-smoker, recently diagnosed with rheumatoid arthritis currently on weekly methotrexate and daily prednisone presented with right-sided chest pain for 2-3 weeks. Patient reported that she gets sharp pain on her right chest sometimes it radiates to back. Initially she thought that it could be due to muscle pain however her symptoms gradually worsened. Today she supposed to see her primary care doctor. However, her shortness of breath worsen to the point that she was gasping at home. She became hypoxemic. Denied fever, chills, coughing, left-sided chest pain. Patient reported no association of pain with exertion. Denied sick contact, recent travel, runny nose, sore throat. No nausea vomiting, abdominal pain, dysuria, urgency, diarrhea or constipation. ED Course: In the ER, patient was hypoxic to 86% in room air. Treated with oxygen, IV fluid. Chest x-ray consistent with right-sided pleural effusion. Admitted for further evaluation. Treated with ceftriaxone and azithromycin empirically.  Review of Systems: As per HPI otherwise 10 point review of systems negative.    Past Medical History:  Diagnosis Date  . Cancer [facility]    right breast cancer  . COPD (chronic obstructive pulmonary disease) ([place])     Past Surgical History:  Procedure Laterality Date  . ABDOMINAL HYSTERECTOMY  1990  . BACK SURGERY    . BREAST SURGERY Right    lumpectomy w/ radiation     Social history reports that she quit smoking about 2 years ago. Her smoking use included Cigarettes. She has a 117.00 pack-year smoking history. She has  never used smokeless tobacco. She reports that she does not drink alcohol or use drugs.  No Known Allergies allergies reviewed with the patient.  Family history: Patient reported that her sister has breast cancer and undergoing treatment.    Prior to Admission medications   Medication Sig Start Date End Date Taking? Authorizing Provider  albuterol (PROVENTIL HFA;VENTOLIN HFA) 108 (90 BASE) MCG/ACT inhaler Inhale 2 puffs into the lungs every 4 (four) hours as needed for wheezing or shortness of breath.    Yes Historical Provider, MD  atorvastatin (LIPITOR) 40 MG tablet Take 40 mg by mouth daily.   Yes Historical Provider, MD  FOLIC ACID PO Take 1 tablet by mouth daily.   Yes Historical Provider, MD  irbesartan-hydrochlorothiazide (AVALIDE) 300-12.5 MG per tablet Take 1 tablet by mouth daily.   Yes Historical Provider, MD  methotrexate (RHEUMATREX) 2.5 MG tablet Take 15 mg by mouth once a week. Every Tuesday. 08/27/16  Yes Historical Provider, MD  predniSONE (DELTASONE) 5 MG tablet Take 10 mg by mouth every morning. 08/30/16  Yes Historical Provider, MD  pregabalin (LYRICA) 200 MG capsule Take 200 mg by mouth daily.    Yes Historical Provider, MD  SYMBICORT 160-4.5 MCG/ACT inhaler USE 2 PUFFS FIRST THING IN THE MORNING AND 2 PUFFS ABOUT 12 HOURS LATER 05/22/15  Yes Tanda Rockers, MD  ipratropium-albuterol (DUONEB) 0.5-2.5 (3) MG/3ML SOLN Take 3 mLs by nebulization every 4 (four) hours. Give box of pre measured containers if possible 12/08/13   Theodis Blaze, MD  oxyCODONE-acetaminophen (PERCOCET) 10-325 MG per tablet Take 1 tablet by mouth every 6 (six) hours as needed for pain.  Patient not taking: Reported on 09/24/2016 12/08/13   Theodis Blaze, MD    Physical Exam: Vitals:   09/24/16 1214 09/24/16 1247 09/24/16 1330  BP:   (!) 126/50  Pulse: 106  100  Resp: 18  18  Temp: 98.5 F (36.9 C)    TempSrc: Oral    SpO2: (!) 86% 97% 93%      Constitutional: NAD, calm, comfortable Vitals:    09/24/16 1214 09/24/16 1247 09/24/16 1330  BP:   (!) 126/50  Pulse: 106  100  Resp: 18  18  Temp: 98.5 F (36.9 C)    TempSrc: Oral    SpO2: (!) 86% 97% 93%   Eyes: PERRL, lids and conjunctivae normal ENMT: Mucous membranes are moist. Posterior pharynx clear of any exudate or lesions.Normal dentition.  Neck: normal, supple, no masses, no thyromegaly Respiratory: decreased breath sound on the right lower lung field, no wheezing or crackles appreciated. Cardiovascular: Regular rate and rhythm, no murmurs / rubs / gallops. No extremity edema. 2+ pedal pulses. No carotid bruits.  Abdomen: no tenderness, no masses palpated. No hepatosplenomegaly. Bowel sounds positive.  Musculoskeletal: no clubbing / cyanosis. No joint deformity upper and lower extremities. Good ROM, no contractures. Normal muscle tone.  Skin: no rashes, lesions, ulcers. No induration Neurologic: CN 2-12 grossly intact. Sensation intact, DTR normal. Strength 5/5 in all 4.  Psychiatric: Normal judgment and insight. Alert and oriented x 3. Normal mood.    Labs on Admission: I have personally reviewed following labs and imaging studies  CBC:  Recent Labs Lab 09/24/16 1305  WBC 12.6*  NEUTROABS 10.8*  HGB 12.3  HCT 39.0  MCV 90.5  PLT 850   Basic Metabolic Panel:  Recent Labs Lab 09/24/16 1305  NA 139  K 3.6  CL 99*  CO2 31  GLUCOSE 177*  BUN 10  CREATININE 0.72  CALCIUM 9.2   GFR: CrCl cannot be calculated (Unknown ideal weight.). Liver Function Tests: No results for input(s): AST, ALT, ALKPHOS, BILITOT, PROT, ALBUMIN in the last 168 hours. No results for input(s): LIPASE, AMYLASE in the last 168 hours. No results for input(s): AMMONIA in the last 168 hours. Coagulation Profile: No results for input(s): INR, PROTIME in the last 168 hours. Cardiac Enzymes: No results for input(s): CKTOTAL, CKMB, CKMBINDEX, TROPONINI in the last 168 hours. BNP (last 3 results) No results for input(s): PROBNP in the  last 8760 hours. HbA1C: No results for input(s): HGBA1C in the last 72 hours. CBG: No results for input(s): GLUCAP in the last 168 hours. Lipid Profile: No results for input(s): CHOL, HDL, LDLCALC, TRIG, CHOLHDL, LDLDIRECT in the last 72 hours. Thyroid Function Tests: No results for input(s): TSH, T4TOTAL, FREET4, T3FREE, THYROIDAB in the last 72 hours. Anemia Panel: No results for input(s): VITAMINB12, FOLATE, FERRITIN, TIBC, IRON, RETICCTPCT in the last 72 hours. Urine analysis:    Component Value Date/Time   COLORURINE YELLOW 04/30/2011 1340   APPEARANCEUR CLEAR 04/30/2011 1340   LABSPEC 1.014 04/30/2011 1340   PHURINE 6.5 04/30/2011 1340   GLUCOSEU NEGATIVE 04/30/2011 1340   HGBUR NEGATIVE 04/30/2011 1340   BILIRUBINUR NEGATIVE 04/30/2011 1340   KETONESUR NEGATIVE 04/30/2011 1340   PROTEINUR NEGATIVE 04/30/2011 1340   UROBILINOGEN 0.2 04/30/2011 1340   NITRITE NEGATIVE 04/30/2011 1340   LEUKOCYTESUR NEGATIVE 04/30/2011 1340   Sepsis Labs: !!!!!!!!!!!!!!!!!!!!!!!!!!!!!!!!!!!!!!!!!!!! '@LABRCNTIP'$ (procalcitonin:4,lacticidven:4) )No results found for this or any previous visit (from the past 240 hour(s)).   Radiological Exams on Admission: Dg Chest 2 View  Result Date: 09/24/2016 CLINICAL DATA:  Shortness of breath and chest pain EXAM: CHEST  2 VIEW COMPARISON:  December 05, 2013 FINDINGS: There is a right pleural effusion with right base atelectasis. Lungs elsewhere are clear. Heart size and pulmonary vascularity are normal. No adenopathy. There is atherosclerotic calcification in the aorta. No bone lesions. IMPRESSION: Right pleural effusion with right base atelectasis. Lungs elsewhere clear. There is aortic atherosclerosis. Electronically Signed   By: Lowella Grip III M.D.   On: 09/24/2016 13:20    EKG: Independently reviewed.sinus tachycardia with no ischemic changes.  Assessment/Plan  #New onset right-sided pleural effusion: Patient with shortness of breath. Has  borderline lactate level, leukocytosis. Exact etiology unknown. Infection process including pneumonia versus malignancy given history of right breast malignancy in the past.Troponin and EKG unremarkable for any ischemic changes. -Patient's symptoms of right-sided chest pain likely due to pleural effusion.  -Patient needs thoracocentesis. I consulted pulmonologist and discussed with them. Need to evaluate the fluid for cell count, protein, glucose, cytology etc. -Continue ceftriaxone, azithromycin.  -Check sputum culture  -Monitor labs, PT PTT in the morning before procedure.  -Keep nothing by mouth post midnight.   #Acute hypoxic respiratory failure in the setting of right pleural effusion: Patient has no wheezing. I do not think patient has COPD exacerbation. Currently on oxygen 2-3 L with acceptable saturation. Continue current medical care as discussed above.   #Hypertension: Blood pressure acceptable continue home medicine.  #History of COPD: Continue bronchodilators.  #Recently diagnosed rheumatoid arthritis: Continue low-dose steroid at home dose. Advised outpatient follow-up.  DVT prophylaxis:  SCDs. No anticoagulation because of possible procedure tomorrow  Code Status:  full code  Family Communication:No family present in ER.  Disposition Plan: likely discharge home in 1-2 days.  Consults called: pulmonologist.  Admission status: inpatient given severity of disease, hypoxia, pleural effusion and need of evaluation.    Kenderick Kobler Tanna Furry MD [facility] Pager [phone]  If 7PM-7AM, please contact night-coverage [email or web address] Password [email or web address]  09/24/2016, 3:25 PM

## 2016-09-24 NOTE — ED Notes (Signed)
RT called

## 2016-09-24 NOTE — Progress Notes (Signed)
Lactic acid 2.8-MD made aware.

## 2016-09-24 NOTE — ED Provider Notes (Signed)
[facility] DEPT Provider Note   CSN: [id number] Arrival date & time: 09/24/16  1206     History   Chief Complaint Chief Complaint  Patient presents with  . Shortness of Breath    HPI [name] is a 70 y.o. female.  HPI  70 year old female with a history of COPD presents with shortness of breath and right-sided chest pain. Has been having cough with intermittent sputum and right-sided chest pain for 2 weeks. Has been unable to get into see her doctor because they have not had any spots. Denies fevers. Shortness of breath, especially with exertion. The chest pain is now wrapping around to her right back. Has not noticed any lesions. Hurts to breathe. No leg pain or leg swelling. Her oxygen saturation was 86% on room air. Feels a little bit better with nasal cannula oxygen and her O2 is now over 95%. Does not normally wear oxygen at home.  Past Medical History:  Diagnosis Date  . Cancer [facility])    right breast cancer  . COPD (chronic obstructive pulmonary disease) [facility])     Patient Active Problem List   Diagnosis Date Noted  . Pleural effusion, right 09/24/2016  . Chronic respiratory failure ([place]) 12/05/2013  . COPD GOLD II 12/03/2013  . HTN (hypertension) 12/03/2013  . HLD (hyperlipidemia) 12/03/2013  . Obesity 12/03/2013  . GERD (gastroesophageal reflux disease) 12/03/2013  . OA (osteoarthritis) 12/03/2013    Past Surgical History:  Procedure Laterality Date  . ABDOMINAL HYSTERECTOMY  1990  . BACK SURGERY    . BREAST SURGERY Right    lumpectomy w/ radiation    OB History    No data available       Home Medications    Prior to Admission medications   Medication Sig Start Date End Date Taking? Authorizing Provider  albuterol (PROVENTIL HFA;VENTOLIN HFA) 108 (90 BASE) MCG/ACT inhaler Inhale 2 puffs into the lungs every 4 (four) hours as needed for wheezing or shortness of breath.    Yes Historical Provider, MD  atorvastatin (LIPITOR) 40 MG tablet Take 40 mg  by mouth daily.   Yes Historical Provider, MD  FOLIC ACID PO Take 1 tablet by mouth daily.   Yes Historical Provider, MD  irbesartan-hydrochlorothiazide (AVALIDE) 300-12.5 MG per tablet Take 1 tablet by mouth daily.   Yes Historical Provider, MD  methotrexate (RHEUMATREX) 2.5 MG tablet Take 15 mg by mouth once a week. Every Tuesday. 08/27/16  Yes Historical Provider, MD  predniSONE (DELTASONE) 5 MG tablet Take 10 mg by mouth every morning. 08/30/16  Yes Historical Provider, MD  pregabalin (LYRICA) 200 MG capsule Take 200 mg by mouth daily.    Yes Historical Provider, MD  SYMBICORT 160-4.5 MCG/ACT inhaler USE 2 PUFFS FIRST THING IN THE MORNING AND 2 PUFFS ABOUT 12 HOURS LATER 05/22/15  Yes Tanda Rockers, MD  ipratropium-albuterol (DUONEB) 0.5-2.5 (3) MG/3ML SOLN Take 3 mLs by nebulization every 4 (four) hours. Give box of pre measured containers if possible 12/08/13   Theodis Blaze, MD  oxyCODONE-acetaminophen (PERCOCET) 10-325 MG per tablet Take 1 tablet by mouth every 6 (six) hours as needed for pain. Patient not taking: Reported on 09/24/2016 12/08/13   Theodis Blaze, MD    Family History No family history on file.  Social History Social History  Substance Use Topics  . Smoking status: Former Smoker    Packs/day: 3.00    Years: 39.00    Types: Cigarettes    Quit date: 12/03/2013  .  Smokeless tobacco: Never Used     Comment: Using e-cig frequently 04/15/14  . Alcohol use No     Allergies   Patient has no known allergies.   Review of Systems Review of Systems  Constitutional: Negative for fever.  Respiratory: Positive for cough and shortness of breath.   Cardiovascular: Positive for chest pain. Negative for leg swelling.  All other systems reviewed and are negative.    Physical Exam Updated Vital Signs BP (!) 126/50   Pulse 100   Temp 98.5 F (36.9 C) (Oral)   Resp 18   SpO2 93%   Physical Exam  Constitutional: She is oriented to person, place, and time. She appears  well-developed and well-nourished.  HENT:  Head: Normocephalic and atraumatic.  Right Ear: External ear normal.  Left Ear: External ear normal.  Nose: Nose normal.  Eyes: Right eye exhibits no discharge. Left eye exhibits no discharge.  Cardiovascular: Normal rate, regular rhythm and normal heart sounds.   Pulmonary/Chest: Effort normal. She has decreased breath sounds in the right lower field. She has no wheezes.  Tenderness over right lateral thoracic back, mid-axillary chest and anterior chest. No rash  Abdominal: Soft. There is no tenderness.  Neurological: She is alert and oriented to person, place, and time.  Skin: Skin is warm and dry.  Nursing note and vitals reviewed.    ED Treatments / Results  Labs (all labs ordered are listed, but only abnormal results are displayed) Labs Reviewed  BASIC METABOLIC PANEL - Abnormal; Notable for the following:       Result Value   Chloride 99 (*)    Glucose, Bld 177 (*)    All other components within normal limits  CBC WITH DIFFERENTIAL/PLATELET - Abnormal; Notable for the following:    WBC 12.6 (*)    RDW 16.3 (*)    Neutro Abs 10.8 (*)    All other components within normal limits  I-STAT CG4 LACTIC ACID, ED - Abnormal; Notable for the following:    Lactic Acid, Venous 2.01 (*)    All other components within normal limits  CULTURE, BLOOD (ROUTINE X 2)  CULTURE, BLOOD (ROUTINE X 2)  I-STAT TROPOININ, ED    EKG  EKG Interpretation  Date/Time:  Tuesday September 24 2016 12:13:44 EST Ventricular Rate:  105 PR Interval:    QRS Duration: 94 QT Interval:  350 QTC Calculation: 463 R Axis:   63 Text Interpretation:  Sinus tachycardia Low voltage, precordial leads rate is faster, otherwise no significant change since Jan 2015 Confirmed by Regenia Skeeter MD, [place] [phone]) on 09/24/2016 12:26:35 PM       Radiology Dg Chest 2 View  Result Date: 09/24/2016 CLINICAL DATA:  Shortness of breath and chest pain EXAM: CHEST  2 VIEW  COMPARISON:  December 05, 2013 FINDINGS: There is a right pleural effusion with right base atelectasis. Lungs elsewhere are clear. Heart size and pulmonary vascularity are normal. No adenopathy. There is atherosclerotic calcification in the aorta. No bone lesions. IMPRESSION: Right pleural effusion with right base atelectasis. Lungs elsewhere clear. There is aortic atherosclerosis. Electronically Signed   By: Lowella Grip III M.D.   On: 09/24/2016 13:20    Procedures Procedures (including critical care time)  Medications Ordered in ED Medications  azithromycin (ZITHROMAX) 500 mg in dextrose 5 % 250 mL IVPB (not administered)  albuterol (PROVENTIL) (2.5 MG/3ML) 0.083% nebulizer solution 5 mg (5 mg Nebulization Given 09/24/16 1247)  ipratropium (ATROVENT) nebulizer solution 0.5 mg (0.5 mg Nebulization Given  09/24/16 1256)  cefTRIAXone (ROCEPHIN) 1 g in dextrose 5 % 50 mL IVPB (1 g Intravenous New Bag/Given 09/24/16 1410)  sodium chloride 0.9 % bolus 1,000 mL (1,000 mLs Intravenous New Bag/Given 09/24/16 1410)     Initial Impression / Assessment and Plan / ED Course  I have reviewed the triage vital signs and the nursing notes.  Pertinent labs & imaging results that were available during my care of the patient were reviewed by me and considered in my medical decision making (see chart for details).  Clinical Course as of Sep 24 1501  Tue Sep 24, 2016  1251 Labs, chest x-ray, albuterol/Atrovent. I do not hear significant wheezing. If her chest x-ray does not reveal any obvious cause for her right-sided chest pain and shortness of breath, she will need a CT scan to rule out PE and/or other acute emergent diagnoses.  [SG]  6606 Chest x-ray shows right pleural effusion. This is likely the cause of her pain as well as dyspnea and new hypoxia. Given IV antibiotics, blood cultures obtained, will need admission. Probably will need drainage for diagnostic and therapeutic purposes, but does not need  it emergently at this time.  [SG]  1428 Dr Carolin Sicks to admit, tele in patient. He will arrange for thoracentesis  [SG]    Clinical Course User Index [SG] Sherwood Gambler, MD    Will give IV antibiotics to cover for infection but will need thoracentesis. Hemodynamically stable and her airway is stable. Saturating well on 3 L oxygen. Admit to the telemetry floor.  Final Clinical Impressions(s) / ED Diagnoses   Final diagnoses:  Pleural effusion, right  Hypoxia    New Prescriptions New Prescriptions   No medications on file     Sherwood Gambler, MD 09/24/16 1503

## 2016-09-24 NOTE — ED Notes (Signed)
MD made are of critical lactic acid value

## 2016-09-24 NOTE — Consult Note (Signed)
Name: [name] MRN: [id number] DOB: 1945-11-06    ADMISSION DATE:  09/24/2016 CONSULTATION DATE:  09/24/2016  REFERRING MD :  Toma Copier  CHIEF COMPLAINT:  Chest pain  HISTORY OF PRESENT ILLNESS:  70 year old ex-smoker presented with right-sided sudden onset chest pain about 3 weeks ago. She was recently diagnosed with rheumatoid arthritis about 2 months ago and started on weekly methotrexate and daily prednisone with improvement in her hand stiffness. She was supposed to see her PCP Dr. Ashby Dawes today but developed increased respiratory distress and came into the emergency room. She was found to be hypoxic to 86% that improved with oxygen, chest x-ray showed a right-sided pleural effusion. She denies fevers or URI symptoms. Her chest pain is still present, nonpleuritic and nonradiating. She felt like she had broken her ribs. Her breathing suddenly got worse and she felt like she was not going to make it. She has been started on empiric ceftriaxone and azithromycin  She smoked about a pack per day for many years until she quit in 2015 She has a history of breast cancer 30 years ago to was treated with lumpectomy and radiation      PAST MEDICAL HISTORY :   has a past medical history of Cancer ([place]) and COPD (chronic obstructive pulmonary disease) ([place]).  has a past surgical history that includes Back surgery; Breast surgery (Right); and Abdominal hysterectomy (1990). Prior to Admission medications   Medication Sig Start Date End Date Taking? Authorizing Provider  albuterol (PROVENTIL HFA;VENTOLIN HFA) 108 (90 BASE) MCG/ACT inhaler Inhale 2 puffs into the lungs every 6 (six) hours as needed for wheezing or shortness of breath.    Yes Historical Provider, MD  atorvastatin (LIPITOR) 40 MG tablet Take 40 mg by mouth daily.   Yes Historical Provider, MD  FOLIC ACID PO Take 1 mg by mouth daily.    Yes Historical Provider, MD  irbesartan-hydrochlorothiazide (AVALIDE) 300-12.5 MG  per tablet Take 1 tablet by mouth daily.   Yes Historical Provider, MD  methotrexate (RHEUMATREX) 2.5 MG tablet Take 15 mg by mouth once a week. Every Tuesday. 08/27/16  Yes Historical Provider, MD  oxyCODONE-acetaminophen (PERCOCET) 10-325 MG per tablet Take 1 tablet by mouth every 6 (six) hours as needed for pain. Patient taking differently: Take 1 tablet by mouth 5 (five) times daily as needed for pain.  12/08/13  Yes Theodis Blaze, MD  predniSONE (DELTASONE) 5 MG tablet Take 10 mg by mouth every morning. 08/30/16  Yes Historical Provider, MD  pregabalin (LYRICA) 200 MG capsule Take 200 mg by mouth daily.    Yes Historical Provider, MD  SYMBICORT 160-4.5 MCG/ACT inhaler USE 2 PUFFS FIRST THING IN THE MORNING AND 2 PUFFS ABOUT 12 HOURS LATER 05/22/15  Yes Tanda Rockers, MD   No Known Allergies  FAMILY HISTORY:  family history is not on file. SOCIAL HISTORY:  reports that she quit smoking about 2 years ago. Her smoking use included Cigarettes. She has a 117.00 pack-year smoking history. She has never used smokeless tobacco. She reports that she does not drink alcohol or use drugs.  REVIEW OF SYSTEMS:   Constitutional: Negative for fever, chills, weight loss, malaise/fatigue and diaphoresis.  HENT: Negative for hearing loss, ear pain, nosebleeds, congestion, sore throat, neck pain, tinnitus and ear discharge.   Eyes: Negative for blurred vision, double vision, photophobia, pain, discharge and redness.  Respiratory: Negative for cough, hemoptysis, sputum production,  wheezing and stridor.   Cardiovascular: Negative for palpitations, orthopnea, claudication,  leg swelling and PND.  Gastrointestinal: Negative for heartburn, nausea, vomiting, abdominal pain, diarrhea, constipation, blood in stool and melena.  Genitourinary: Negative for dysuria, urgency, frequency, hematuria and flank pain.  Musculoskeletal: Negative for myalgias, back pain, joint pain and falls.  Skin: Negative for itching and  rash.  Neurological: Negative for dizziness, tingling, tremors, sensory change, speech change, focal weakness, seizures, loss of consciousness, weakness and headaches.  Endo/Heme/Allergies: Negative for environmental allergies and polydipsia. Does not bruise/bleed easily.  SUBJECTIVE:   VITAL SIGNS: Temp:  [98 F (36.7 C)-98.5 F (36.9 C)] 98 F (36.7 C) (11/21 2155) Pulse Rate:  [96-106] 98 (11/21 2155) Resp:  [17-19] 18 (11/21 2155) BP: (107-126)/(50-64) 112/62 (11/21 2155) SpO2:  [86 %-97 %] 95 % (11/21 2155) Weight:  [241 lb 10 oz (109.6 kg)] 241 lb 10 oz (109.6 kg) (11/21 1637)  PHYSICAL EXAMINATION: Gen. Pleasant, obese, in no distress, normal affect ENT - no lesions, no post nasal drip, class 2-3 airway Neck: No JVD, no thyromegaly, no carotid bruits Lungs: no use of accessory muscles, no dullness to percussion, decreased rt base without rales or rhonchi  Cardiovascular: Rhythm regular, heart sounds  normal, no murmurs or gallops, no peripheral edema Abdomen: soft and non-tender, no hepatosplenomegaly, BS normal. Musculoskeletal: No deformities, no cyanosis or clubbing Neuro:  alert, non focal, no tremors    Recent Labs Lab 09/24/16 1305  NA 139  K 3.6  CL 99*  CO2 31  BUN 10  CREATININE 0.72  GLUCOSE 177*    Recent Labs Lab 09/24/16 1305  HGB 12.3  HCT 39.0  WBC 12.6*  PLT 367   Dg Chest 2 View  Result Date: 09/24/2016 CLINICAL DATA:  Shortness of breath and chest pain EXAM: CHEST  2 VIEW COMPARISON:  December 05, 2013 FINDINGS: There is a right pleural effusion with right base atelectasis. Lungs elsewhere are clear. Heart size and pulmonary vascularity are normal. No adenopathy. There is atherosclerotic calcification in the aorta. No bone lesions. IMPRESSION: Right pleural effusion with right base atelectasis. Lungs elsewhere clear. There is aortic atherosclerosis. Electronically Signed   By: Lowella Grip III M.D.   On: 09/24/2016 13:20     ASSESSMENT / PLAN:     Differential diagnosis of acute onset chest pain with a right pleural effusion in this heavy ex-smoker with rheumatoid arthritis includes-pleurisy due to RA, less likely parapneumonic effusion. Pulmonary embolism is also possible Less likely malignant effusion   Recommend- check d-dimer, if this is positive we will have to proceed with CT angiogram to rule out pulmonary embolism-clinical probability is intermediate to low  Otherwise we can proceed with thoracentesis tomorrow-fluid should be sent for cell count, glucose, protein and LDH Besides culture and cytology. A low fluid glucose be typical for rheumatoid effusion  I discussed procedure with the patient and she is agreeable to proceed We will ask IR to perform   Kara Mead MD. FCCP. [facility] Pager [phone] If no response call [phone]    09/24/2016, 9:59 PM

## 2016-09-24 NOTE — ED Triage Notes (Signed)
Per pt, states SOB for a few days-states she had an appointment with PCP today but couldnt walk from car to office sue to becoming SOB

## 2016-09-24 NOTE — ED Notes (Signed)
Will give bedside report.

## 2016-09-25 ENCOUNTER — Inpatient Hospital Stay (HOSPITAL_COMMUNITY): Payer: Medicare Other

## 2016-09-25 DIAGNOSIS — J181 Lobar pneumonia, unspecified organism: Secondary | ICD-10-CM

## 2016-09-25 LAB — BASIC METABOLIC PANEL
Anion gap: 7 (ref 5–15)
BUN: 10 mg/dL (ref 6–20)
CALCIUM: 8.7 mg/dL — AB (ref 8.9–10.3)
CHLORIDE: 103 mmol/L (ref 101–111)
CO2: 32 mmol/L (ref 22–32)
CREATININE: 0.68 mg/dL (ref 0.44–1.00)
GFR calc non Af Amer: >60 mL/min (ref >60)
GLUCOSE: 120 mg/dL — AB (ref 65–99)
Potassium: 3.2 mmol/L — ABNORMAL LOW (ref 3.5–5.1)
Sodium: 142 mmol/L (ref 135–145)

## 2016-09-25 LAB — GRAM STAIN

## 2016-09-25 LAB — BODY FLUID CELL COUNT WITH DIFFERENTIAL
LYMPHS FL: 56 %
MONOCYTE-MACROPHAGE-SEROUS FLUID: 26 % — AB (ref 50–90)
NEUTROPHIL FLUID: 18 % (ref 0–25)
WBC FLUID: 3190 uL — AB (ref 0–1000)

## 2016-09-25 LAB — PROTIME-INR
INR: 0.97
Prothrombin Time: 12.9 seconds (ref 11.4–15.2)

## 2016-09-25 LAB — PROTEIN, BODY FLUID: Total protein, fluid: 3.8 g/dL

## 2016-09-25 LAB — GLUCOSE, SEROUS FLUID: Glucose, Fluid: 160 mg/dL

## 2016-09-25 LAB — CBC
HCT: 36.9 % (ref 36.0–46.0)
Hemoglobin: 11 g/dL — ABNORMAL LOW (ref 12.0–15.0)
MCH: 27.8 pg (ref 26.0–34.0)
MCHC: 29.8 g/dL — AB (ref 30.0–36.0)
MCV: 93.4 fL (ref 78.0–100.0)
PLATELETS: 401 10*3/uL — AB (ref 150–400)
RBC: 3.95 MIL/uL (ref 3.87–5.11)
RDW: 16.6 % — ABNORMAL HIGH (ref 11.5–15.5)
WBC: 11.9 10*3/uL — ABNORMAL HIGH (ref 4.0–10.5)

## 2016-09-25 LAB — LACTATE DEHYDROGENASE, PLEURAL OR PERITONEAL FLUID: LD, Fluid: 578 U/L — ABNORMAL HIGH (ref 3–23)

## 2016-09-25 LAB — APTT: aPTT: 26 seconds (ref 24–36)

## 2016-09-25 LAB — GLUCOSE, CAPILLARY: GLUCOSE-CAPILLARY: 130 mg/dL — AB (ref 65–99)

## 2016-09-25 MED ORDER — POTASSIUM CHLORIDE CRYS ER 20 MEQ PO TBCR
20.0000 meq | EXTENDED_RELEASE_TABLET | Freq: Once | ORAL | Status: AC
  Administered 2016-09-25: 20 meq via ORAL
  Filled 2016-09-25: qty 1

## 2016-09-25 MED ORDER — HYDROMORPHONE HCL 1 MG/ML IJ SOLN
0.5000 mg | INTRAMUSCULAR | Status: DC | PRN
  Administered 2016-09-25 – 2016-10-05 (×6): 0.5 mg via INTRAVENOUS
  Filled 2016-09-25 (×2): qty 0.5
  Filled 2016-09-25 (×3): qty 1
  Filled 2016-09-25: qty 0.5

## 2016-09-25 NOTE — Progress Notes (Signed)
Patient ID: [name], female   DOB: 1946/04/16, 70 y.o.   MRN: [id number]  PROGRESS NOTE    Maley Venezia  FBP:[id number] DOB: 11-Sep-1946 DOA: 09/24/2016  PCP: Merrilee Seashore, MD   Brief Narrative:  70 y.o. female with past medical history significant for hypertension, COPD not on home oxygen, history of right breast malignancy, status post lumpectomy and radiation therapy about 30 years ago, ex-smoker, recently diagnosed with rheumatoid arthritis (on weekly methotrexate and daily prednisone). She presented to ED with right-sided chest pain for 2-3 weeks PTA. Pain is sharp, on her right chest and radiates to back. Her symptoms gradually worsened. Shortness of breath also worsened in past 24 hours PTA. In ED, patient was hypoxic with O2 sats in 86% range on room air. Chest x-ray was consistent with right-sided pleural effusion. Treated with ceftriaxone and azithromycin empirically.   Assessment & Plan:   Acute respiratory failure with hypoxia in the setting of new onset right-sided pleural effusion - CT angio chest showed no PE - Pt does have moderate right-sided pleural effusion, with partial consolidation at the right lung base, could reflect pneumonia. Apparent 1.7 x 1.0 cm pleural-based nodule anteriorly at th right middle lobe. Malignancy cannot be excluded. PET/CT would be helpful for further evaluation.  - Plan for diagnostic thoracentesis today per pulmonary   Right lower lobe pneumonia, unspecified organism / Leukocytosis  - On empiric azithromycin and rocephin   Incidental thyroid nodule, left lobe - 1.7 cm hypoattenuating nodule at the left thyroid lobe. - Pt not hyperthyroid - This can be followed oupt - Check TSH  Hypokalemia - Due to nebulizer treatment - Supplemented   Essential hypertension - Continue Hctz and Avapro  History of COPD - Continue Dulera BID and albuterol nebulizer every 4 hours as needed fr shortness of breath or wheezing   Recently diagnosed  rheumatoid arthritis - Continue low-dose steroid   DVT prophylaxis: SCD's bilaterally  Code Status: full code  Family Communication: no family at the bedside this am Disposition Plan: home once resp status stable, plan for thoracentesis today    Consultants:   Pulmonary   Procedures:   Thoracentesis planned for today   Antimicrobials:   Azithromycin and rocephin 09/24/2016 -->    Subjective: Pain over the right side rib cage, radiating to right side, 10/10.  Objective: Vitals:   09/24/16 2155 09/25/16 0522 09/25/16 0814 09/25/16 1154  BP: 112/62 (!) 134/57    Pulse: 98 95    Resp: 18 18    Temp: 98 F (36.7 C) 98.8 F (37.1 C)    TempSrc: Oral Oral    SpO2: 95% 92% 91% 93%  Weight:      Height:        Intake/Output Summary (Last 24 hours) at 09/25/16 1341 Last data filed at 09/24/16 1758  Gross per 24 hour  Intake              290 ml  Output                0 ml  Net              290 ml   Filed Weights   09/24/16 1637  Weight: 109.6 kg (241 lb 10 oz)    Examination:  General exam: Appears calm and comfortable  Respiratory system: Diminished breath sounds on right side, no wheezing  Cardiovascular system: S1 & S2 heard, Rate controlled  Gastrointestinal system: Abdomen is nondistended, soft and nontender. No organomegaly or masses  felt. Normal bowel sounds heard. Central nervous system: Alert and oriented. No focal neurological deficits. Extremities: Symmetric 5 x 5 power. Skin: No rashes, lesions or ulcers Psychiatry: Judgement and insight appear normal. Mood & affect appropriate.   Data Reviewed: I have personally reviewed following labs and imaging studies  CBC:  Recent Labs Lab 09/24/16 1305 09/25/16 0353  WBC 12.6* 11.9*  NEUTROABS 10.8*  --   HGB 12.3 11.0*  HCT 39.0 36.9  MCV 90.5 93.4  PLT 367 478*   Basic Metabolic Panel:  Recent Labs Lab 09/24/16 1305 09/25/16 0353  NA 139 142  K 3.6 3.2*  CL 99* 103  CO2 31 32  GLUCOSE  177* 120*  BUN 10 10  CREATININE 0.72 0.68  CALCIUM 9.2 8.7*   GFR: Estimated Creatinine Clearance: 79.7 mL/min (by C-G formula based on SCr of 0.68 mg/dL). Liver Function Tests: No results for input(s): AST, ALT, ALKPHOS, BILITOT, PROT, ALBUMIN in the last 168 hours. No results for input(s): LIPASE, AMYLASE in the last 168 hours. No results for input(s): AMMONIA in the last 168 hours. Coagulation Profile:  Recent Labs Lab 09/25/16 0353  INR 0.97   Cardiac Enzymes: No results for input(s): CKTOTAL, CKMB, CKMBINDEX, TROPONINI in the last 168 hours. BNP (last 3 results) No results for input(s): PROBNP in the last 8760 hours. HbA1C: No results for input(s): HGBA1C in the last 72 hours. CBG:  Recent Labs Lab 09/25/16 0749  GLUCAP 130*   Lipid Profile: No results for input(s): CHOL, HDL, LDLCALC, TRIG, CHOLHDL, LDLDIRECT in the last 72 hours. Thyroid Function Tests: No results for input(s): TSH, T4TOTAL, FREET4, T3FREE, THYROIDAB in the last 72 hours. Anemia Panel: No results for input(s): VITAMINB12, FOLATE, FERRITIN, TIBC, IRON, RETICCTPCT in the last 72 hours. Urine analysis:    Component Value Date/Time   COLORURINE YELLOW 04/30/2011 1340   APPEARANCEUR CLEAR 04/30/2011 1340   LABSPEC 1.014 04/30/2011 1340   PHURINE 6.5 04/30/2011 1340   GLUCOSEU NEGATIVE 04/30/2011 1340   HGBUR NEGATIVE 04/30/2011 1340   BILIRUBINUR NEGATIVE 04/30/2011 1340   KETONESUR NEGATIVE 04/30/2011 1340   PROTEINUR NEGATIVE 04/30/2011 1340   UROBILINOGEN 0.2 04/30/2011 1340   NITRITE NEGATIVE 04/30/2011 1340   LEUKOCYTESUR NEGATIVE 04/30/2011 1340   Sepsis Labs: '@LABRCNTIP'$ (procalcitonin:4,lacticidven:4)   )No results found for this or any previous visit (from the past 240 hour(s)).    Radiology Studies: Dg Chest 2 View Result Date: 09/24/2016 Right pleural effusion with right base atelectasis. Lungs elsewhere clear. There is aortic atherosclerosis.   Ct Angio Chest Pe W Or Wo  Contrast Result Date: 09/25/2016 1. No evidence of significant pulmonary embolus. 2. Moderate right-sided pleural effusion, with partial consolidation at the right lung base. This could reflect pneumonia. 3. Apparent 1.7 x 1.0 cm pleural-based nodule anteriorly at the right middle lobe. Malignancy cannot be excluded. PET/CT would be helpful for further evaluation. Alternatively, diagnostic thoracentesis might be considered. 4. **An incidental finding of potential clinical significance has been found. 1.7 cm hypoattenuating nodule at the left thyroid lobe. Consider further evaluation with thyroid ultrasound. If patient is clinically hyperthyroid, consider nuclear medicine thyroid uptake and scan.** 5. Scattered coronary artery calcifications seen. 6. 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    Scheduled Meds: . atorvastatin  40 mg Oral q1800  . azithromycin  500 mg Oral Daily  . cefTRIAXone (ROCEPHIN)  IV  1 g Intravenous Q24H  . folic acid  1 mg Oral Daily  . irbesartan  300 mg Oral Daily  And  . hydrochlorothiazide  12.5 mg Oral Daily  . ipratropium-albuterol  3 mL Nebulization QID  . mometasone-formoterol  2 puff Inhalation BID  . predniSONE  10 mg Oral Q breakfast  . pregabalin  200 mg Oral QHS  . sodium chloride flush  3 mL Intravenous Q12H   Continuous Infusions:   LOS: 1 day    Time spent: 25 minutes  Greater than 50% of the time spent on counseling and coordinating the care.   Leisa Lenz, MD [facility] Pager [phone]  If 7PM-7AM, please contact night-coverage [email or web address] Password [facility] 09/25/2016, 1:41 PM

## 2016-09-25 NOTE — Progress Notes (Signed)
Pt states that she will not need HH at this present time.  Pt was not on O2 at home, will continue to follow pt.

## 2016-09-25 NOTE — Progress Notes (Signed)
Name: [name] MRN: [id number] DOB: [birth year]    ADMISSION DATE:  09/24/2016 CONSULTATION DATE:  09/25/2016  REFERRING MD :  Toma Copier  CHIEF COMPLAINT:  Chest pain  HISTORY OF PRESENT ILLNESS:  70 year old ex-smoker presented with right-sided sudden onset chest pain about 3 weeks ago. She was recently diagnosed with rheumatoid arthritis about 2 months ago and started on weekly methotrexate and daily prednisone with improvement in her hand stiffness. She was supposed to see her PCP Dr. Ashby Dawes today but developed increased respiratory distress and came into the emergency room. She was found to be hypoxic to 86% that improved with oxygen, chest x-ray showed a right-sided pleural effusion. She denies fevers or URI symptoms. Her chest pain is still present, nonpleuritic and nonradiating. She felt like she had broken her ribs. Her breathing suddenly got worse and she felt like she was not going to make it. She has been started on empiric ceftriaxone and azithromycin. She smoked about a pack per day for many years until she quit in 2015. She has a history of breast cancer 30 years ago to was treated with lumpectomy and radiation  SUBJECTIVE:  Patient still reporting dyspnea and right-sided chest pain. Denies any new chest pain. No change in cough. Awaiting thoracentesis by IR.  REVIEW OF SYSTEMS:  No fever, chills, or sweats. No nausea or emesis. No headache or vision changes.  VITAL SIGNS: Temp:  [98 F (36.7 C)-98.8 F (37.1 C)] 98.2 F (36.8 C) (11/22 1451) Pulse Rate:  [95-103] 97 (11/22 1451) Resp:  [18-19] 19 (11/22 1451) BP: (107-134)/(46-64) 107/46 (11/22 1451) SpO2:  [91 %-95 %] 95 % (11/22 1451) Weight:  [241 lb 10 oz (109.6 kg)] 241 lb 10 oz (109.6 kg) (11/21 1637)  PHYSICAL EXAMINATION: General: Obese female. No distress. Eating lung. Sister at bedside. HEENT:  Moist membranes. No scleral icterus or injection. No oral ulcers.   Pulmonary:  Decreased breath  sounds right lung base. Normal work of breathing on nasal cannula oxygen.  Cardiovascular: Regular rate & rhythm. No edema. Abdomen: Soft. Protuberant. Normal bowel sounds. Neurological:  CN 2-12 grossly in tact. No meningismus. Moving all 4 extremities.     Recent Labs Lab 09/24/16 1305 09/25/16 0353  NA 139 142  K 3.6 3.2*  CL 99* 103  CO2 31 32  BUN 10 10  CREATININE 0.72 0.68  GLUCOSE 177* 120*    Recent Labs Lab 09/24/16 1305 09/25/16 0353  HGB 12.3 11.0*  HCT 39.0 36.9  WBC 12.6* 11.9*  PLT 367 401*   Dg Chest 2 View  Result Date: 09/24/2016 CLINICAL DATA:  Shortness of breath and chest pain EXAM: CHEST  2 VIEW COMPARISON:  December 05, 2013 FINDINGS: There is a right pleural effusion with right base atelectasis. Lungs elsewhere are clear. Heart size and pulmonary vascularity are normal. No adenopathy. There is atherosclerotic calcification in the aorta. No bone lesions. IMPRESSION: Right pleural effusion with right base atelectasis. Lungs elsewhere clear. There is aortic atherosclerosis. Electronically Signed   By: Lowella Grip III M.D.   On: 09/24/2016 13:20   Ct Angio Chest Pe W Or Wo Contrast  Result Date: 09/25/2016 CLINICAL DATA:  Subacute onset of right-sided chest pain. Increasing respiratory distress. Leukocytosis and elevated D-dimer. Initial encounter. EXAM: CT ANGIOGRAPHY CHEST WITH CONTRAST TECHNIQUE: Multidetector CT imaging of the chest was performed using the standard protocol during bolus administration of intravenous contrast. Multiplanar CT image reconstructions and MIPs were obtained to evaluate the vascular anatomy. CONTRAST:  100 mL of Isovue 370 IV contrast COMPARISON:  None. FINDINGS: Cardiovascular: There is no evidence of significant pulmonary embolus. Evaluation for pulmonary embolus is suboptimal in areas of airspace consolidation and due to motion artifact. Scattered calcification is noted along the aortic arch and descending thoracic aorta.  The heart remains borderline normal in size. Scattered coronary artery calcifications are seen. The great vessels are unremarkable in appearance. Mediastinum/Nodes: Visualized mediastinal nodes remain normal in size. No pericardial effusion is identified. Bilateral hypoattenuating nodules are noted within the thyroid gland, measuring up to 1.7 cm on the left. No axillary lymphadenopathy is seen. The patient is status post right-sided mastectomy. Lungs/Pleura: A moderate right-sided pleural effusion is noted, with partial consolidation at the right lung base. There is a 1.7 x 1.0 cm pleural-based nodule anteriorly at the right middle lobe (image 49 of 95). Malignancy cannot be excluded. No pneumothorax is seen. Upper Abdomen: The visualized portions of the liver and spleen are grossly unremarkable. The visualized portions of the adrenal glands are within normal limits. Musculoskeletal: No acute osseous abnormalities are identified. An hemangioma is noted at vertebral body T9. The visualized musculature is unremarkable in appearance. Review of the MIP images confirms the above findings. IMPRESSION: 1. No evidence of significant pulmonary embolus. 2. Moderate right-sided pleural effusion, with partial consolidation at the right lung base. This could reflect pneumonia. 3. Apparent 1.7 x 1.0 cm pleural-based nodule anteriorly at the right middle lobe. Malignancy cannot be excluded. PET/CT would be helpful for further evaluation. Alternatively, diagnostic thoracentesis might be considered. 4. **An incidental finding of potential clinical significance has been found. 1.7 cm hypoattenuating nodule at the left thyroid lobe. Consider further evaluation with thyroid ultrasound. If patient is clinically hyperthyroid, consider nuclear medicine thyroid uptake and scan.** 5. Scattered coronary artery calcifications seen. 6. 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 Electronically Signed   By: Garald Balding M.D.   On: 09/25/2016  00:58    ASSESSMENT / PLAN:  70 y.o. female with right pleural effusion and history of Rheumatoid Arthritis. CTA of the chest negative for pulmonary embolism. With the patient's history the differential would include malignancy as well as rheumatoid as a cause for her pain & effusion. Awaiting pleural fluid result to provide further guidance. Spoke with patient's nurse who informs me it will be this afternoon.   Sonia Baller Ashok Cordia, M.D. [facility] Pager:  [phone] After 3pm or if no response, call [phone] 09/25/2016, 3:18 PM

## 2016-09-25 NOTE — Procedures (Signed)
Ultrasound-guided diagnostic and therapeutic right thoracentesis performed yielding 1.5 liters of hazy, amber colored fluid. No immediate complications. Follow-up chest x-ray pending. The fluid was sent to the lab for preordered studies.

## 2016-09-26 ENCOUNTER — Inpatient Hospital Stay (HOSPITAL_COMMUNITY): Payer: Medicare Other

## 2016-09-26 DIAGNOSIS — E041 Nontoxic single thyroid nodule: Secondary | ICD-10-CM

## 2016-09-26 DIAGNOSIS — R911 Solitary pulmonary nodule: Secondary | ICD-10-CM | POA: Diagnosis present

## 2016-09-26 DIAGNOSIS — J449 Chronic obstructive pulmonary disease, unspecified: Secondary | ICD-10-CM

## 2016-09-26 DIAGNOSIS — Z9889 Other specified postprocedural states: Secondary | ICD-10-CM

## 2016-09-26 DIAGNOSIS — R0902 Hypoxemia: Secondary | ICD-10-CM

## 2016-09-26 LAB — BASIC METABOLIC PANEL
ANION GAP: 7 (ref 5–15)
BUN: 15 mg/dL (ref 6–20)
CALCIUM: 8.4 mg/dL — AB (ref 8.9–10.3)
CHLORIDE: 103 mmol/L (ref 101–111)
CO2: 31 mmol/L (ref 22–32)
Creatinine, Ser: 0.68 mg/dL (ref 0.44–1.00)
GFR calc Af Amer: >60 mL/min (ref >60)
GFR calc non Af Amer: >60 mL/min (ref >60)
GLUCOSE: 116 mg/dL — AB (ref 65–99)
POTASSIUM: 3 mmol/L — AB (ref 3.5–5.1)
Sodium: 141 mmol/L (ref 135–145)

## 2016-09-26 LAB — CBC
HEMATOCRIT: 34.2 % — AB (ref 36.0–46.0)
HEMOGLOBIN: 10.4 g/dL — AB (ref 12.0–15.0)
MCH: 27.9 pg (ref 26.0–34.0)
MCHC: 30.4 g/dL (ref 30.0–36.0)
MCV: 91.7 fL (ref 78.0–100.0)
Platelets: 361 10*3/uL (ref 150–400)
RBC: 3.73 MIL/uL — AB (ref 3.87–5.11)
RDW: 16.6 % — ABNORMAL HIGH (ref 11.5–15.5)
WBC: 11.1 10*3/uL — ABNORMAL HIGH (ref 4.0–10.5)

## 2016-09-26 LAB — PH, BODY FLUID: PH, BODY FLUID: 7.4

## 2016-09-26 LAB — TSH: TSH: 0.28 u[IU]/mL — ABNORMAL LOW (ref 0.350–4.500)

## 2016-09-26 LAB — GLUCOSE, CAPILLARY: Glucose-Capillary: 128 mg/dL — ABNORMAL HIGH (ref 65–99)

## 2016-09-26 LAB — T4, FREE: Free T4: 0.92 ng/dL (ref 0.61–1.12)

## 2016-09-26 MED ORDER — IPRATROPIUM-ALBUTEROL 0.5-2.5 (3) MG/3ML IN SOLN
3.0000 mL | Freq: Three times a day (TID) | RESPIRATORY_TRACT | Status: DC
  Administered 2016-09-26 – 2016-09-27 (×3): 3 mL via RESPIRATORY_TRACT
  Filled 2016-09-26 (×3): qty 3

## 2016-09-26 MED ORDER — METOPROLOL TARTRATE 25 MG PO TABS
25.0000 mg | ORAL_TABLET | Freq: Two times a day (BID) | ORAL | Status: DC
  Administered 2016-09-26 – 2016-10-08 (×25): 25 mg via ORAL
  Filled 2016-09-26 (×25): qty 1

## 2016-09-26 MED ORDER — POTASSIUM CHLORIDE CRYS ER 20 MEQ PO TBCR
40.0000 meq | EXTENDED_RELEASE_TABLET | Freq: Once | ORAL | Status: AC
  Administered 2016-09-26: 40 meq via ORAL
  Filled 2016-09-26: qty 2

## 2016-09-26 NOTE — Progress Notes (Addendum)
Patient ID: [name], female   DOB: 06-08-1946, 70 y.o.   MRN: [id number]  PROGRESS NOTE    Maudy Yonan  KGU:[id number] DOB: [birth year] DOA: 09/24/2016  PCP: Merrilee Seashore, MD   Brief Narrative:  70 y.o. female with past medical history significant for hypertension, COPD not on home oxygen, history of right breast malignancy, status post lumpectomy and radiation therapy about 30 years ago, ex-smoker, recently diagnosed with rheumatoid arthritis (on weekly methotrexate and daily prednisone). She presented to ED with right-sided chest pain for 2-3 weeks PTA. Pain is sharp, on her right chest and radiates to back. Her symptoms gradually worsened. Shortness of breath also worsened in past 24 hours PTA. In ED, patient was hypoxic with O2 sats in 86% range on room air. Chest x-ray was consistent with right-sided pleural effusion. Treated with ceftriaxone and azithromycin empirically.   Assessment & Plan:   Acute respiratory failure with hypoxia in the setting of new onset right-sided pleural effusion - CT angio chest showed no PE - Pt does have moderate right-sided pleural effusion, with partial consolidation at the right lung base, could reflect pneumonia. Apparent 1.7 x 1.0 cm pleural-based nodule anteriorly at th right middle lobe. Malignancy cannot be excluded. PET/CT would be helpful for further evaluation.  - S/p thoracentesis 11/22 with 1.5 L fluid removed and sent for analysis  - CXR 11/22 showed aortic atherosclerosis. Right pleural effusion has resolved status post thoracentesis. No pneumothorax is noted.  Right lower lobe pneumonia, unspecified organism / Leukocytosis  - On empiric azithromycin and rocephin  - Blood cx negative so far   Incidental thyroid nodule, left lobe - 1.7 cm hypoattenuating nodule at the left thyroid lobe. - TSH 0.28 this am - Patient clinically does not look hyperthyroid but today she does have tachycardia - Will order thyroid ultrasound, T3 and free  T4 - Start low dose metoprolol as she does have COPD but her resp status is stable at this time   Hypokalemia - Due to nebulizer treatment - Supplemented  - Check BMP in am  Essential hypertension - Continue Hctz and Avapro  History of COPD - Continue Dulera BID and albuterol nebulizer every 4 hours as needed for shortness of breath or wheezing   Recently diagnosed rheumatoid arthritis - Continue low-dose steroid   DVT prophylaxis: SCD's bilaterally  Code Status: full code  Family Communication: no family at the bedside this am Disposition Plan: home once resp status stable   Consultants:   Pulmonary   IR   Procedures:   Thoracentesis 09/25/2016 -->   Antimicrobials:   Azithromycin and rocephin 09/24/2016 -->    Subjective: Pain over the right side rib cage slightly better.   Objective: Vitals:   09/26/16 0548 09/26/16 0843 09/26/16 0942 09/26/16 0959  BP: (!) 119/49     Pulse: 99 (!) 101 99 (!) 125  Resp: 20 16    Temp: 98.9 F (37.2 C)     TempSrc: Oral     SpO2: 92% 90% (!) 86% (!) 79%  Weight:      Height:        Intake/Output Summary (Last 24 hours) at 09/26/16 1151 Last data filed at 09/25/16 1506  Gross per 24 hour  Intake              340 ml  Output                0 ml  Net  340 ml   Filed Weights   09/24/16 1637  Weight: 109.6 kg (241 lb 10 oz)    Examination:  General exam: Appears calm and comfortable, no distress  Respiratory system: Diminished breath sounds on right side, no wheezing  Cardiovascular system: S1 & S2 heard, Rate controlled  Gastrointestinal system: (+) BS, non tender  Central nervous system: No focal neurological deficits. Extremities: No swelling, palpable pulses  Skin: warm and dry  Psychiatry: Normal mood and affect   Data Reviewed: I have personally reviewed following labs and imaging studies  CBC:  Recent Labs Lab 09/24/16 1305 09/25/16 0353 09/26/16 0338  WBC 12.6* 11.9* 11.1*   NEUTROABS 10.8*  --   --   HGB 12.3 11.0* 10.4*  HCT 39.0 36.9 34.2*  MCV 90.5 93.4 91.7  PLT 367 401* 299   Basic Metabolic Panel:  Recent Labs Lab 09/24/16 1305 09/25/16 0353 09/26/16 0338  NA 139 142 141  K 3.6 3.2* 3.0*  CL 99* 103 103  CO2 31 32 31  GLUCOSE 177* 120* 116*  BUN '10 10 15  '$ CREATININE 0.72 0.68 0.68  CALCIUM 9.2 8.7* 8.4*   GFR: Estimated Creatinine Clearance: 79.7 mL/min (by C-G formula based on SCr of 0.68 mg/dL). Liver Function Tests: No results for input(s): AST, ALT, ALKPHOS, BILITOT, PROT, ALBUMIN in the last 168 hours. No results for input(s): LIPASE, AMYLASE in the last 168 hours. No results for input(s): AMMONIA in the last 168 hours. Coagulation Profile:  Recent Labs Lab 09/25/16 0353  INR 0.97   Cardiac Enzymes: No results for input(s): CKTOTAL, CKMB, CKMBINDEX, TROPONINI in the last 168 hours. BNP (last 3 results) No results for input(s): PROBNP in the last 8760 hours. HbA1C: No results for input(s): HGBA1C in the last 72 hours. CBG:  Recent Labs Lab 09/25/16 0749 09/26/16 0751  GLUCAP 130* 128*   Lipid Profile: No results for input(s): CHOL, HDL, LDLCALC, TRIG, CHOLHDL, LDLDIRECT in the last 72 hours. Thyroid Function Tests:  Recent Labs  09/26/16 0338  TSH 0.280*   Anemia Panel: No results for input(s): VITAMINB12, FOLATE, FERRITIN, TIBC, IRON, RETICCTPCT in the last 72 hours. Urine analysis:    Component Value Date/Time   COLORURINE YELLOW 04/30/2011 1340   APPEARANCEUR CLEAR 04/30/2011 1340   LABSPEC 1.014 04/30/2011 1340   PHURINE 6.5 04/30/2011 1340   GLUCOSEU NEGATIVE 04/30/2011 1340   HGBUR NEGATIVE 04/30/2011 1340   BILIRUBINUR NEGATIVE 04/30/2011 1340   KETONESUR NEGATIVE 04/30/2011 1340   PROTEINUR NEGATIVE 04/30/2011 1340   UROBILINOGEN 0.2 04/30/2011 1340   NITRITE NEGATIVE 04/30/2011 1340   LEUKOCYTESUR NEGATIVE 04/30/2011 1340   Sepsis  Labs: '@LABRCNTIP'$ (procalcitonin:4,lacticidven:4)    Recent Results (from the past 240 hour(s))  Culture, blood (routine x 2)     Status: None (Preliminary result)   Collection Time: 09/24/16  1:44 PM  Result Value Ref Range Status   Specimen Description BLOOD RIGHT ANTECUBITAL  Final   Special Requests BOTTLES DRAWN AEROBIC AND ANAEROBIC 5CC  Final   Culture   Final    NO GROWTH < 24 HOURS Performed at [facility]    Report Status PENDING  Incomplete  Culture, blood (routine x 2)     Status: None (Preliminary result)   Collection Time: 09/24/16  1:58 PM  Result Value Ref Range Status   Specimen Description BLOOD LEFT ANTECUBITAL  Final   Special Requests BOTTLES DRAWN AEROBIC AND ANAEROBIC 5CC  Final   Culture   Final    NO  GROWTH < 24 HOURS Performed at [facility]    Report Status PENDING  Incomplete  Gram stain     Status: None   Collection Time: 09/25/16  3:48 PM  Result Value Ref Range Status   Specimen Description FLUID PLEURAL RIGHT  Final   Special Requests NONE  Final   Gram Stain   Final    WBC PRESENT, PREDOMINANTLY MONONUCLEAR NO ORGANISMS SEEN CYTOSPIN Performed at [facility]    Report Status 09/25/2016 FINAL  Final      Radiology Studies: Dg Chest 1 View Result Date: 09/25/2016 Aortic atherosclerosis. Right pleural effusion has resolved status post thoracentesis. No pneumothorax is noted. Electronically Signed   By: Marijo Conception, M.D.   On: 09/25/2016 16:44   US Thoracentesis Asp Pleural Space W/img Guide Result Date: 09/25/2016 Successful ultrasound guided diagnostic and therapeutic right thoracentesis yielding 1.5 liters of pleural fluid. Read by: Rowe Robert, PA-C Electronically Signed   By: Marybelle Killings M.D.   On: 09/25/2016 16:15     Dg Chest 2 View Result Date: 09/24/2016 Right pleural effusion with right base atelectasis. Lungs elsewhere clear. There is aortic atherosclerosis.   Ct Angio Chest Pe W Or Wo  Contrast Result Date: 09/25/2016 1. No evidence of significant pulmonary embolus. 2. Moderate right-sided pleural effusion, with partial consolidation at the right lung base. This could reflect pneumonia. 3. Apparent 1.7 x 1.0 cm pleural-based nodule anteriorly at the right middle lobe. Malignancy cannot be excluded. PET/CT would be helpful for further evaluation. Alternatively, diagnostic thoracentesis might be considered. 4. **An incidental finding of potential clinical significance has been found. 1.7 cm hypoattenuating nodule at the left thyroid lobe. Consider further evaluation with thyroid ultrasound. If patient is clinically hyperthyroid, consider nuclear medicine thyroid uptake and scan.** 5. Scattered coronary artery calcifications seen. 6. 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    Scheduled Meds: . atorvastatin  40 mg Oral q1800  . azithromycin  500 mg Oral Daily  . cefTRIAXone (ROCEPHIN)  IV  1 g Intravenous Q24H  . folic acid  1 mg Oral Daily  . irbesartan  300 mg Oral Daily   And  . hydrochlorothiazide  12.5 mg Oral Daily  . ipratropium-albuterol  3 mL Nebulization TID  . mometasone-formoterol  2 puff Inhalation BID  . potassium chloride  40 mEq Oral Once  . predniSONE  10 mg Oral Q breakfast  . pregabalin  200 mg Oral QHS  . sodium chloride flush  3 mL Intravenous Q12H   Continuous Infusions:   LOS: 2 days    Time spent: 25 minutes  Greater than 50% of the time spent on counseling and coordinating the care.   Leisa Lenz, MD [facility] Pager [phone]  If 7PM-7AM, please contact night-coverage [email or web address] Password [facility] 09/26/2016, 11:51 AM

## 2016-09-26 NOTE — Progress Notes (Signed)
Name: [name] MRN: [id number] DOB: [birth year]    ADMISSION DATE:  09/24/2016 CONSULTATION DATE:  09/26/2016  REFERRING MD :  Toma Copier  CHIEF COMPLAINT:  Chest pain  HISTORY OF PRESENT ILLNESS:  70 year old ex-smoker presented with right-sided sudden onset chest pain about 3 weeks ago. She was recently diagnosed with rheumatoid arthritis about 2 months ago and started on weekly methotrexate and daily prednisone with improvement in her hand stiffness. She was supposed to see her PCP Dr. Ashby Dawes today but developed increased respiratory distress and came into the emergency room. She was found to be hypoxic to 86% that improved with oxygen, chest x-ray showed a right-sided pleural effusion. She denies fevers or URI symptoms. Her chest pain is still present, nonpleuritic and nonradiating. She felt like she had broken her ribs. Her breathing suddenly got worse and she felt like she was not going to make it. She has been started on empiric ceftriaxone and azithromycin. She smoked about a pack per day for many years until she quit in 2015. She has a history of breast cancer 30 years ago to was treated with lumpectomy and radiation  SUBJECTIVE:  Less dyspnea, but some discomfort in chest with deep breath since thoracentesis.  REVIEW OF SYSTEMS:  No fever, chills, or sweats. No nausea or emesis. No headache or vision changes.  VITAL SIGNS: Temp:  [98.2 F (36.8 C)-98.9 F (37.2 C)] 98.9 F (37.2 C) (11/23 0548) Pulse Rate:  [95-125] 125 (11/23 0959) Resp:  [16-20] 16 (11/23 0843) BP: (107-126)/(46-63) 119/49 (11/23 0548) SpO2:  [79 %-95 %] 79 % (11/23 0959)  PHYSICAL EXAMINATION: General: Obese female. No distress.  HEENT:  Moist membranes. No scleral icterus or injection. No stridor  Pulmonary:  Dullness and few crackles deep R base. No rub. Cardiovascular: Regular rate & rhythm. No edema. Abdomen: Soft. Protuberant. Normal bowel sounds. Neurological:   Moving all 4  extremities. Oriented, non-focal    Recent Labs Lab 09/24/16 1305 09/25/16 0353 09/26/16 0338  NA 139 142 141  K 3.6 3.2* 3.0*  CL 99* 103 103  CO2 31 32 31  BUN '10 10 15  '$ CREATININE 0.72 0.68 0.68  GLUCOSE 177* 120* 116*    Recent Labs Lab 09/24/16 1305 09/25/16 0353 09/26/16 0338  HGB 12.3 11.0* 10.4*  HCT 39.0 36.9 34.2*  WBC 12.6* 11.9* 11.1*  PLT 367 401* 361   Dg Chest 1 View  Result Date: 09/25/2016 CLINICAL DATA:  Status post right thoracentesis. EXAM: CHEST 1 VIEW COMPARISON:  Radiographs of September 24, 2016. FINDINGS: Stable cardiomegaly. Atherosclerosis of thoracic aorta is noted. No pneumothorax is noted. Right pleural effusion appears to have resolved. No acute pulmonary disease is noted. Bony thorax is unremarkable. IMPRESSION: Aortic atherosclerosis. Right pleural effusion has resolved status post thoracentesis. No pneumothorax is noted. Electronically Signed   By: Marijo Conception, M.D.   On: 09/25/2016 16:44   Dg Chest 2 View  Result Date: 09/24/2016 CLINICAL DATA:  Shortness of breath and chest pain EXAM: CHEST  2 VIEW COMPARISON:  December 05, 2013 FINDINGS: There is a right pleural effusion with right base atelectasis. Lungs elsewhere are clear. Heart size and pulmonary vascularity are normal. No adenopathy. There is atherosclerotic calcification in the aorta. No bone lesions. IMPRESSION: Right pleural effusion with right base atelectasis. Lungs elsewhere clear. There is aortic atherosclerosis. Electronically Signed   By: Lowella Grip III M.D.   On: 09/24/2016 13:20   Ct Angio Chest Pe W Or Wo Contrast  Result Date: 09/25/2016 CLINICAL DATA:  Subacute onset of right-sided chest pain. Increasing respiratory distress. Leukocytosis and elevated D-dimer. Initial encounter. EXAM: CT ANGIOGRAPHY CHEST WITH CONTRAST TECHNIQUE: Multidetector CT imaging of the chest was performed using the standard protocol during bolus administration of intravenous contrast.  Multiplanar CT image reconstructions and MIPs were obtained to evaluate the vascular anatomy. CONTRAST:  100 mL of Isovue 370 IV contrast COMPARISON:  None. FINDINGS: Cardiovascular: There is no evidence of significant pulmonary embolus. Evaluation for pulmonary embolus is suboptimal in areas of airspace consolidation and due to motion artifact. Scattered calcification is noted along the aortic arch and descending thoracic aorta. The heart remains borderline normal in size. Scattered coronary artery calcifications are seen. The great vessels are unremarkable in appearance. Mediastinum/Nodes: Visualized mediastinal nodes remain normal in size. No pericardial effusion is identified. Bilateral hypoattenuating nodules are noted within the thyroid gland, measuring up to 1.7 cm on the left. No axillary lymphadenopathy is seen. The patient is status post right-sided mastectomy. Lungs/Pleura: A moderate right-sided pleural effusion is noted, with partial consolidation at the right lung base. There is a 1.7 x 1.0 cm pleural-based nodule anteriorly at the right middle lobe (image 49 of 95). Malignancy cannot be excluded. No pneumothorax is seen. Upper Abdomen: The visualized portions of the liver and spleen are grossly unremarkable. The visualized portions of the adrenal glands are within normal limits. Musculoskeletal: No acute osseous abnormalities are identified. An hemangioma is noted at vertebral body T9. The visualized musculature is unremarkable in appearance. Review of the MIP images confirms the above findings. IMPRESSION: 1. No evidence of significant pulmonary embolus. 2. Moderate right-sided pleural effusion, with partial consolidation at the right lung base. This could reflect pneumonia. 3. Apparent 1.7 x 1.0 cm pleural-based nodule anteriorly at the right middle lobe. Malignancy cannot be excluded. PET/CT would be helpful for further evaluation. Alternatively, diagnostic thoracentesis might be considered. 4.  **An incidental finding of potential clinical significance has been found. 1.7 cm hypoattenuating nodule at the left thyroid lobe. Consider further evaluation with thyroid ultrasound. If patient is clinically hyperthyroid, consider nuclear medicine thyroid uptake and scan.** 5. Scattered coronary artery calcifications seen. 6. 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 Electronically Signed   By: Garald Balding M.D.   On: 09/25/2016 00:58   US Thoracentesis Asp Pleural Space W/img Guide  Result Date: 09/25/2016 INDICATION: Ex-smoker with remote history of breast cancer, COPD, rheumatoid arthritis, dyspnea, pneumonia, right pleural effusion. Request made for diagnostic and therapeutic right thoracentesis. EXAM: ULTRASOUND GUIDED DIAGNOSTIC AND THERAPEUTIC RIGHT THORACENTESIS MEDICATIONS: None. COMPLICATIONS: None immediate. PROCEDURE: An ultrasound guided thoracentesis was thoroughly discussed with the patient and questions answered. The benefits, risks, alternatives and complications were also discussed. The patient understands and wishes to proceed with the procedure. Written consent was obtained. Ultrasound was performed to localize and mark an adequate pocket of fluid in the right chest. The area was then prepped and draped in the normal sterile fashion. 1% Lidocaine was used for local anesthesia. Under ultrasound guidance a Safe-T-Centesis catheter was introduced. Thoracentesis was performed. The catheter was removed and a dressing applied. FINDINGS: A total of approximately 1.5 liters of hazy, amber fluid was removed. Samples were sent to the laboratory as requested by the clinical team. IMPRESSION: Successful ultrasound guided diagnostic and therapeutic right thoracentesis yielding 1.5 liters of pleural fluid. Read by: Rowe Robert, PA-C Electronically Signed   By: Marybelle Killings M.D.   On: 09/25/2016 16:15    I reviewed CTand CXR and  agree  ASSESSMENT / PLAN:  70 y.o. female with right pleural effusion  and history of Rheumatoid Arthritis. CTA of the chest negative for pulmonary embolism. With the patient's history the differential would include malignancy as well as rheumatoid as a cause for her pain & effusion.  Initial leukocytosis and exudative effusion with 3000 WBCs, so appropriate to cover for parapneumonic effusion while awaiting cytology.   Unless fluid cytology returns with malignancy, she should have needle bx of R anterior pleural based density.  Bartholomew Crews, M.D. [facility] Pulmonary [facility] Critical [facility] Pager:  [phone] After 3pm or if no response, call [phone] 09/26/2016, 1:14 PM

## 2016-09-26 NOTE — Evaluation (Signed)
Physical Therapy Evaluation Patient Details Name: [name] MRN: [id number] DOB: 08-22-1946 Today's Date: 09/26/2016   History of Present Illness  70 y.o. admitted with pleural effusion (? malignant). s/p thoracentesis. PMH of Breast cancer, RA, HTN, COPD.   Clinical Impression  Pt admitted with above diagnosis. Pt currently with functional limitations due to the deficits listed below (see PT Problem List). Pt ambulated 80' with RW, SaO2 79% on RA walking, 93% on 3L O2 after 3 minutes rest. HR 125 walking. Pt has 2 flights of stairs to enter her apartment, this will be a challenge for her return home, but she stated she can take them slowly and feels she'll manage. She refuses SNF. Encouraged her to consider a ground floor apartment. Pt will benefit from skilled PT to increase their independence and safety with mobility to allow discharge to the venue listed below.       Follow Up Recommendations Home health PT    Equipment Recommendations  Other (comment) (oxygen -light weight tank if possible, rollator (4 wheeled RW with seat))    Recommendations for Other Services       Precautions / Restrictions Precautions Precautions: Other (comment) Precaution Comments: monitor O2, pt denies h/o falls in past 1 year Restrictions Weight Bearing Restrictions: No      Mobility  Bed Mobility Overal bed mobility: Modified Independent             General bed mobility comments: HOB up 35*  Transfers Overall transfer level: Needs assistance Equipment used: Rolling walker (2 wheeled) Transfers: Sit to/from Stand Sit to Stand: Min guard         General transfer comment: VCs hand placement and safety  Ambulation/Gait Ambulation/Gait assistance: Supervision Ambulation Distance (Feet): 80 Feet Assistive device: Rolling walker (2 wheeled) Gait Pattern/deviations: Step-through pattern;Decreased step length - right;Trunk flexed;Decreased step length - left   Gait velocity  interpretation: Below normal speed for age/gender General Gait Details: SaO2 79-83% walking on RA, 93% on 3L after several minutes rest with pursed lip breathing, HR 125 walking, no LOB, distance limited by 3/4 dyspnea  Stairs            Wheelchair Mobility    Modified Rankin (Stroke Patients Only)       Balance Overall balance assessment: Modified Independent (denies h/o falls)                                           Pertinent Vitals/Pain Pain Assessment: No/denies pain    Home Living Family/patient expects to be discharged to:: Private residence Living Arrangements: Other relatives (sister) Available Help at Discharge: Family;Available 24 hours/day Type of Home: Apartment Home Access: Stairs to enter Entrance Stairs-Rails: Left Entrance Stairs-Number of Steps: 2 flights Home Layout: One level Home Equipment: Cane - single point;Walker - 2 wheels;Bedside commode      Prior Function Level of Independence: Independent with assistive device(s)         Comments: uses SPC, drives     Hand Dominance        Extremity/Trunk Assessment   Upper Extremity Assessment: Overall WFL for tasks assessed           Lower Extremity Assessment: Overall WFL for tasks assessed      Cervical / Trunk Assessment: Kyphotic  Communication   Communication: No difficulties  Cognition Arousal/Alertness: Awake/alert Behavior During Therapy: WFL for tasks assessed/performed Overall Cognitive  Status: Within Functional Limits for tasks assessed                      General Comments      Exercises     Assessment/Plan    PT Assessment Patient needs continued PT services  PT Problem List Decreased activity tolerance;Decreased mobility;Cardiopulmonary status limiting activity          PT Treatment Interventions Gait training;Stair training;Functional mobility training;Therapeutic exercise;Therapeutic activities;Patient/family education     PT Goals (Current goals can be found in the Care Plan section)  Acute Rehab PT Goals Patient Stated Goal: return to independence PT Goal Formulation: With patient Time For Goal Achievement: 10/10/16 Potential to Achieve Goals: Good    Frequency Min 3X/week   Barriers to discharge        Co-evaluation               End of Session Equipment Utilized During Treatment: Gait belt;Oxygen Activity Tolerance: Patient limited by fatigue Patient left: with call bell/phone within reach Nurse Communication: Mobility status (O2 sats)         Time: 1517-6160 PT Time Calculation (min) (ACUTE ONLY): 42 min   Charges:   PT Evaluation $PT Eval Low Complexity: 1 Procedure PT Treatments $Gait Training: 8-22 mins $Therapeutic Activity: 8-22 mins   PT G Codes:        Philomena Doheny 09/26/2016, 10:27 AM [phone]

## 2016-09-26 NOTE — Progress Notes (Addendum)
SATURATION QUALIFICATIONS: (This note is used to comply with regulatory documentation for home oxygen)  Patient Saturations on Room Air at Rest = 86%  Patient Saturations on Room Air while Ambulating = 79%  Patient Saturations on 3L O2 after 3 min rest = 93%  Please briefly explain why patient needs home oxygen:to maintain SaO2 greater than 90% Blondell Reveal Kistler PT 09/26/2016  [phone]

## 2016-09-27 DIAGNOSIS — D72829 Elevated white blood cell count, unspecified: Secondary | ICD-10-CM

## 2016-09-27 DIAGNOSIS — J90 Pleural effusion, not elsewhere classified: Secondary | ICD-10-CM | POA: Diagnosis present

## 2016-09-27 LAB — BASIC METABOLIC PANEL
ANION GAP: 7 (ref 5–15)
BUN: 11 mg/dL (ref 6–20)
CALCIUM: 8.4 mg/dL — AB (ref 8.9–10.3)
CHLORIDE: 105 mmol/L (ref 101–111)
CO2: 27 mmol/L (ref 22–32)
Creatinine, Ser: 0.58 mg/dL (ref 0.44–1.00)
GFR calc non Af Amer: >60 mL/min (ref >60)
GLUCOSE: 102 mg/dL — AB (ref 65–99)
Potassium: 2.9 mmol/L — ABNORMAL LOW (ref 3.5–5.1)
Sodium: 139 mmol/L (ref 135–145)

## 2016-09-27 LAB — CBC
HEMATOCRIT: 33.7 % — AB (ref 36.0–46.0)
HEMOGLOBIN: 10.3 g/dL — AB (ref 12.0–15.0)
MCH: 27.8 pg (ref 26.0–34.0)
MCHC: 30.6 g/dL (ref 30.0–36.0)
MCV: 91.1 fL (ref 78.0–100.0)
Platelets: 362 10*3/uL (ref 150–400)
RBC: 3.7 MIL/uL — ABNORMAL LOW (ref 3.87–5.11)
RDW: 16.4 % — AB (ref 11.5–15.5)
WBC: 11.2 10*3/uL — AB (ref 4.0–10.5)

## 2016-09-27 LAB — T3: T3, Total: 92 ng/dL (ref 71–180)

## 2016-09-27 LAB — GLUCOSE, CAPILLARY
GLUCOSE-CAPILLARY: 159 mg/dL — AB (ref 65–99)
Glucose-Capillary: 104 mg/dL — ABNORMAL HIGH (ref 65–99)

## 2016-09-27 MED ORDER — IPRATROPIUM-ALBUTEROL 0.5-2.5 (3) MG/3ML IN SOLN
3.0000 mL | Freq: Two times a day (BID) | RESPIRATORY_TRACT | Status: DC
  Administered 2016-09-27 – 2016-10-02 (×10): 3 mL via RESPIRATORY_TRACT
  Filled 2016-09-27 (×10): qty 3

## 2016-09-27 MED ORDER — POTASSIUM CHLORIDE CRYS ER 20 MEQ PO TBCR
40.0000 meq | EXTENDED_RELEASE_TABLET | Freq: Once | ORAL | Status: AC
  Administered 2016-09-27: 40 meq via ORAL
  Filled 2016-09-27: qty 2

## 2016-09-27 MED ORDER — ROPINIROLE HCL 0.25 MG PO TABS
0.2500 mg | ORAL_TABLET | Freq: Every day | ORAL | Status: DC
  Administered 2016-09-27 – 2016-10-07 (×11): 0.25 mg via ORAL
  Filled 2016-09-27 (×11): qty 1

## 2016-09-27 NOTE — Progress Notes (Signed)
Name: [name] MRN: [id number] DOB: [birth year]    ADMISSION DATE:  09/24/2016 CONSULTATION DATE:  09/27/2016  REFERRING MD :  Toma Copier  CHIEF COMPLAINT:  Chest pain  HISTORY OF PRESENT ILLNESS:  70 year old ex-smoker presented with right-sided sudden onset chest pain about 3 weeks ago. She was recently diagnosed with rheumatoid arthritis about 2 months ago and started on weekly methotrexate and daily prednisone with improvement in her hand stiffness. She was supposed to see her PCP Dr. Ashby Dawes today but developed increased respiratory distress and came into the emergency room. She was found to be hypoxic to 86% that improved with oxygen, chest x-ray showed a right-sided pleural effusion. She denies fevers or URI symptoms. Her chest pain is still present, nonpleuritic and nonradiating. She felt like she had broken her ribs. Her breathing suddenly got worse and she felt like she was not going to make it. She has been started on empiric ceftriaxone and azithromycin. She smoked about a pack per day for many years until she quit in 2015. She has a history of breast cancer 30 years ago to was treated with lumpectomy and radiation  RIGHT PFA (09/25/16): PH:  7.4 Glucose:  160 LDH:  578 TP:  3.8 (serum unknown) WBC:  3190 (neutro 18%, mono/macro 26, lymph 56%) Ctx >> Cytology >>  SUBJECTIVE:  Patient reports dyspnea has not changed with exertion. Reports chest pain has changed and lessened. Denies any new chest discomfort. Intermittent coughing productive of some mild mucus. Good appetite.   REVIEW OF SYSTEMS:  No fever or chills. No nausea or abdominal pain. No headache or vision changes.  VITAL SIGNS: Temp:  [97.4 F (36.3 C)-98.7 F (37.1 C)] 97.4 F (36.3 C) (11/24 1300) Pulse Rate:  [74-88] 74 (11/24 1300) Resp:  [16-24] 20 (11/24 1300) BP: (106-115)/(48-66) 106/48 (11/24 1300) SpO2:  [93 %-97 %] 96 % (11/24 1300)  PHYSICAL EXAMINATION: General: Obese female.  Laying on her left side. Sleeping until awoken. HEENT:  Moist membranes. No icterus.  Pulmonary:  Slightly decreased breath sounds in left lung base. Normal work of breathing on nasal cannula. Speaking in complete sentences. Cardiovascular: Regular rate & rhythm. No edema. Abdomen: Soft. Protuberant. Nontender. Neurological:   Moving all 4 extremities. Oriented, non-focal    Recent Labs Lab 09/25/16 0353 09/26/16 0338 09/27/16 0322  NA 142 141 139  K 3.2* 3.0* 2.9*  CL 103 103 105  CO2 32 31 27  BUN '10 15 11  '$ CREATININE 0.68 0.68 0.58  GLUCOSE 120* 116* 102*    Recent Labs Lab 09/25/16 0353 09/26/16 0338 09/27/16 0322  HGB 11.0* 10.4* 10.3*  HCT 36.9 34.2* 33.7*  WBC 11.9* 11.1* 11.2*  PLT 401* 361 362   Dg Chest 1 View  Result Date: 09/25/2016 CLINICAL DATA:  Status post right thoracentesis. EXAM: CHEST 1 VIEW COMPARISON:  Radiographs of September 24, 2016. FINDINGS: Stable cardiomegaly. Atherosclerosis of thoracic aorta is noted. No pneumothorax is noted. Right pleural effusion appears to have resolved. No acute pulmonary disease is noted. Bony thorax is unremarkable. IMPRESSION: Aortic atherosclerosis. Right pleural effusion has resolved status post thoracentesis. No pneumothorax is noted. Electronically Signed   By: Marijo Conception, M.D.   On: 09/25/2016 16:44   US Thyroid  Result Date: 09/27/2016 CLINICAL DATA:  Incidental on CT.  Thyroid nodule seen on CT. EXAM: THYROID ULTRASOUND TECHNIQUE: Ultrasound examination of the thyroid gland and adjacent soft tissues was performed. COMPARISON:  Chest CT 09/24/2016 FINDINGS: Parenchymal Echotexture: Mildly heterogenous  Estimated total number of nodules >/= 1 cm: 3 Number of spongiform nodules >/=  2 cm not described below (TR1): 0 Number of mixed cystic and solid nodules >/= 1.5 cm not described below (TR2): 0 _________________________________________________________ Isthmus: Measures 0.4 cm in thickness. No discrete nodules are  identified within the thyroid isthmus. _________________________________________________________ Right lobe: Measures 4.2 x 1.6 x 2.0 cm. Nodule # 1: Location: Isthmus; Superior Size: 1.6 x 1.3 x 1.4 cm. Composition: solid/almost completely solid (2), there is an eccentric cystic component. Echogenicity: isoechoic (1) Shape: not taller-than-wide (0) Margins: ill-defined (0) Echogenic foci: none (0) ACR TI-RADS total points: 3. ACR TI-RADS risk category: TR3 (3 points). ACR TI-RADS recommendations: *Given size (>/= 1.5 - 2.4 cm) and appearance, a follow-up ultrasound in 1 year should be considered based on TI-RADS criteria. Nodule # 2: Location: Right; Inferior Size: 0.6 x 0.4 x 0.5 cm Composition: solid/almost completely solid (2) Echogenicity: hypoechoic (2) Shape: not taller-than-wide (0) Margins: ill-defined (0) Echogenic foci: none (0) ACR TI-RADS total points: 4. ACR TI-RADS risk category: TR4 (4-6 points). ACR TI-RADS recommendations: Given size (<0.9 cm) and appearance, this nodule does NOT meet TI-RADS criteria for biopsy or dedicated follow-up. _________________________________________________________ Left lobe: Measures 4.1 x 1.6 x 1.9 cm. Nodule # 3: Location: Left; Superior Size: 1.0 x 1.0 x 1.0 cm. Composition: mixed cystic and solid (1) Echogenicity: hypoechoic (2) Shape: not taller-than-wide (0) Margins: smooth (0) Echogenic foci: none (0) ACR TI-RADS total points: 3. ACR TI-RADS risk category: TR3 (3 points). ACR TI-RADS recommendations: Given size (<1.4 cm) and appearance, this nodule does NOT meet TI-RADS criteria for biopsy or dedicated follow-up. There is a cystic structure in the superior left thyroid lobe measuring 1.0 x 0.7 x 0.7 cm. IMPRESSION: Solid and cystic nodules in the thyroid. Largest nodule measures 1.6 cm in the superior right lobe. This nodule meets criteria for 1 year follow-up. The above is in keeping with the ACR TI-RADS recommendations - J Am Coll Radiol 2017;14:587-595.  Electronically Signed   By: Markus Daft M.D.   On: 09/27/2016 07:59   US Thoracentesis Asp Pleural Space W/img Guide  Result Date: 09/25/2016 INDICATION: Ex-smoker with remote history of breast cancer, COPD, rheumatoid arthritis, dyspnea, pneumonia, right pleural effusion. Request made for diagnostic and therapeutic right thoracentesis. EXAM: ULTRASOUND GUIDED DIAGNOSTIC AND THERAPEUTIC RIGHT THORACENTESIS MEDICATIONS: None. COMPLICATIONS: None immediate. PROCEDURE: An ultrasound guided thoracentesis was thoroughly discussed with the patient and questions answered. The benefits, risks, alternatives and complications were also discussed. The patient understands and wishes to proceed with the procedure. Written consent was obtained. Ultrasound was performed to localize and mark an adequate pocket of fluid in the right chest. The area was then prepped and draped in the normal sterile fashion. 1% Lidocaine was used for local anesthesia. Under ultrasound guidance a Safe-T-Centesis catheter was introduced. Thoracentesis was performed. The catheter was removed and a dressing applied. FINDINGS: A total of approximately 1.5 liters of hazy, amber fluid was removed. Samples were sent to the laboratory as requested by the clinical team. IMPRESSION: Successful ultrasound guided diagnostic and therapeutic right thoracentesis yielding 1.5 liters of pleural fluid. Read by: Rowe Robert, PA-C Electronically Signed   By: Marybelle Killings M.D.   On: 09/25/2016 16:15    ASSESSMENT / PLAN:  70 y.o. female with right pleural effusion and history of Rheumatoid Arthritis. CTA of the chest negative for pulmonary embolism. With the patient's history the differential would include malignancy as well as rheumatoid as a cause for her  pain & effusion. Effusion is exudative and with a lymphocyte predominance I am mostly concerned about possible malignancy.   1. Exudative Right Pleural Effusion:  Awaiting pleural fluid cytology and culture  completion. May require alternative biopsy depending on results. Would recommend PET CT versus repeat thoracentesis if cytology is negative to guide further biopsies.  We will reassess the patient on 11/27. Please call the rounding physician if there are any questions or concerns over the weekend.   Sonia Baller Ashok Cordia, M.D. [facility] Pulmonary [facility] Critical Care Pager:  [phone] After 3pm or if no response, call [phone] 09/27/2016, 2:23 PM

## 2016-09-27 NOTE — Progress Notes (Addendum)
Patient ID: [name], female   DOB: 12-09-1945, 70 y.o.   MRN: [id number]  PROGRESS NOTE    [name]  XBJ:[id number] DOB: Dec 15, 1945 DOA: 09/24/2016  PCP: Merrilee Seashore, MD   Brief Narrative:  70 y.o. female with past medical history significant for hypertension, COPD not on home oxygen, history of right breast malignancy, status post lumpectomy and radiation therapy about 30 years ago, ex-smoker, recently diagnosed with rheumatoid arthritis (on weekly methotrexate and daily prednisone). She presented to ED with right-sided chest pain for 2-3 weeks PTA. Pain is sharp, on her right chest and radiates to back. Her symptoms gradually worsened. Shortness of breath also worsened in past 24 hours PTA.  In ED, patient was hypoxic with O2 sats in 86% range on room air. Chest x-ray was consistent with right-sided pleural effusion. Treated with ceftriaxone and azithromycin empirically.  Assessment & Plan:   Acute respiratory failure with hypoxia in the setting of new onset right-sided pleural effusion - CT angio chest showed no PE - Pt with moderate right-sided pleural effusion, with partial consolidation at the right lung base, could reflect pneumonia. Apparent 1.7 x 1.0 cm pleural-based nodule anteriorly at th right middle lobe. Malignancy cannot be excluded. PET/CT would be helpful for further evaluation.  - S/p thoracentesis 11/22 with 1.5 L fluid removed and sent for analysis - exudative effusion, per PCCM appropriate to cover for parapneumonic effusion until cytology results are available. - CXR 11/22 showed aortic atherosclerosis. Right pleural effusion has resolved status post thoracentesis. No pneumothorax is noted. - Stable respiratory status  Right lower lobe pneumonia, unspecified organism / Leukocytosis  - On empiric azithromycin and rocephin  - Blood cx negative so far   Incidental thyroid nodule, left lobe - 1.7 cm hypoattenuating nodule at the left thyroid lobe. - TSH 0.28    - Patient clinically does not look hyperthyroid but today she does have tachycardia - Free T3 and T4 are WNL so this could be more of a euthyroid  - Thyroid US showed solid and cystic nodules in the thyroid. Largest nodule measures 1.6 cm in the superior right lobe. This nodule meets criteria for 1 year follow-up. - Started low dose metoprolol   Hypokalemia - Due to nebulizer treatment - Supplemented again today  - Check BMP in am  Essential hypertension - Continue Hctz and Avapro  History of COPD - Continue Dulera TID and albuterol nebulizer every 4 hours as needed for shortness of breath or wheezing   Recently diagnosed rheumatoid arthritis - Continue low-dose steroid   DVT prophylaxis: SCD's bilaterally  Code Status: full code  Family Communication: no family at the bedside this am Disposition Plan: home once resp status stable   Consultants:   Pulmonary   IR   Procedures:   Thoracentesis 09/25/2016   Antimicrobials:   Azithromycin and rocephin 09/24/2016 -->    Subjective: Pain better this am   Objective: Vitals:   09/26/16 2058 09/26/16 2059 09/27/16 0633 09/27/16 0938  BP:   108/66   Pulse:   88 86  Resp:   (!) 24 16  Temp:   98.7 F (37.1 C)   TempSrc:   Oral   SpO2: 97% 97% 93% 95%  Weight:      Height:        Intake/Output Summary (Last 24 hours) at 09/27/16 1057 Last data filed at 09/26/16 2130  Gross per 24 hour  Intake              360  ml  Output                0 ml  Net              360 ml   Filed Weights   09/24/16 1637  Weight: 109.6 kg (241 lb 10 oz)    Examination:  General exam: Appears calm and comfortable, no distress  Respiratory system: Diminished breath sounds on right side, no wheezing  Cardiovascular system: S1 & S2 heard, RRR Gastrointestinal system: (+) BS, no distention  Central nervous system: Nonfocal  Extremities: No swelling, palpable pulses bilaterally  Skin: warm and dry, no lesions or ulcers   Psychiatry: Normal mood and affect, no agitation or restlessness   Data Reviewed: I have personally reviewed following labs and imaging studies  CBC:  Recent Labs Lab 09/24/16 1305 09/25/16 0353 09/26/16 0338 09/27/16 0322  WBC 12.6* 11.9* 11.1* 11.2*  NEUTROABS 10.8*  --   --   --   HGB 12.3 11.0* 10.4* 10.3*  HCT 39.0 36.9 34.2* 33.7*  MCV 90.5 93.4 91.7 91.1  PLT 367 401* 361 468   Basic Metabolic Panel:  Recent Labs Lab 09/24/16 1305 09/25/16 0353 09/26/16 0338 09/27/16 0322  NA 139 142 141 139  K 3.6 3.2* 3.0* 2.9*  CL 99* 103 103 105  CO2 31 32 31 27  GLUCOSE 177* 120* 116* 102*  BUN '10 10 15 11  '$ CREATININE 0.72 0.68 0.68 0.58  CALCIUM 9.2 8.7* 8.4* 8.4*   GFR: Estimated Creatinine Clearance: 79.7 mL/min (by C-G formula based on SCr of 0.58 mg/dL). Liver Function Tests: No results for input(s): AST, ALT, ALKPHOS, BILITOT, PROT, ALBUMIN in the last 168 hours. No results for input(s): LIPASE, AMYLASE in the last 168 hours. No results for input(s): AMMONIA in the last 168 hours. Coagulation Profile:  Recent Labs Lab 09/25/16 0353  INR 0.97   Cardiac Enzymes: No results for input(s): CKTOTAL, CKMB, CKMBINDEX, TROPONINI in the last 168 hours. BNP (last 3 results) No results for input(s): PROBNP in the last 8760 hours. HbA1C: No results for input(s): HGBA1C in the last 72 hours. CBG:  Recent Labs Lab 09/25/16 0749 09/26/16 0751 09/27/16 0808  GLUCAP 130* 128* 104*   Lipid Profile: No results for input(s): CHOL, HDL, LDLCALC, TRIG, CHOLHDL, LDLDIRECT in the last 72 hours. Thyroid Function Tests:  Recent Labs  09/26/16 0338 09/26/16 1210  TSH 0.280*  --   FREET4  --  0.92   Anemia Panel: No results for input(s): VITAMINB12, FOLATE, FERRITIN, TIBC, IRON, RETICCTPCT in the last 72 hours. Urine analysis:    Component Value Date/Time   COLORURINE YELLOW 04/30/2011 1340   APPEARANCEUR CLEAR 04/30/2011 1340   LABSPEC 1.014 04/30/2011 1340    PHURINE 6.5 04/30/2011 1340   GLUCOSEU NEGATIVE 04/30/2011 1340   HGBUR NEGATIVE 04/30/2011 1340   BILIRUBINUR NEGATIVE 04/30/2011 1340   KETONESUR NEGATIVE 04/30/2011 1340   PROTEINUR NEGATIVE 04/30/2011 1340   UROBILINOGEN 0.2 04/30/2011 1340   NITRITE NEGATIVE 04/30/2011 1340   LEUKOCYTESUR NEGATIVE 04/30/2011 1340   Sepsis Labs: '@LABRCNTIP'$ (procalcitonin:4,lacticidven:4)    Recent Results (from the past 240 hour(s))  Culture, blood (routine x 2)     Status: None (Preliminary result)   Collection Time: 09/24/16  1:44 PM  Result Value Ref Range Status   Specimen Description BLOOD RIGHT ANTECUBITAL  Final   Special Requests BOTTLES DRAWN AEROBIC AND ANAEROBIC 5CC  Final   Culture   Final    NO GROWTH 2  DAYS Performed at [facility]    Report Status PENDING  Incomplete  Culture, blood (routine x 2)     Status: None (Preliminary result)   Collection Time: 09/24/16  1:58 PM  Result Value Ref Range Status   Specimen Description BLOOD LEFT ANTECUBITAL  Final   Special Requests BOTTLES DRAWN AEROBIC AND ANAEROBIC 5CC  Final   Culture   Final    NO GROWTH 2 DAYS Performed at [facility]    Report Status PENDING  Incomplete  Culture, body fluid-bottle     Status: None (Preliminary result)   Collection Time: 09/25/16  3:48 PM  Result Value Ref Range Status   Specimen Description FLUID PLEURAL RIGHT  Final   Special Requests BOTTLES DRAWN AEROBIC AND ANAEROBIC 10CC  Final   Culture   Final    NO GROWTH < 24 HOURS Performed at [facility]    Report Status PENDING  Incomplete  Gram stain     Status: None   Collection Time: 09/25/16  3:48 PM  Result Value Ref Range Status   Specimen Description FLUID PLEURAL RIGHT  Final   Special Requests NONE  Final   Gram Stain   Final    WBC PRESENT, PREDOMINANTLY MONONUCLEAR NO ORGANISMS SEEN CYTOSPIN Performed at [facility]    Report Status 09/25/2016 FINAL  Final      Radiology Studies: Dg Chest  1 View Result Date: 09/25/2016 Aortic atherosclerosis. Right pleural effusion has resolved status post thoracentesis. No pneumothorax is noted. Electronically Signed   By: Marijo Conception, M.D.   On: 09/25/2016 16:44   US Thoracentesis Asp Pleural Space W/img Guide Result Date: 09/25/2016 Successful ultrasound guided diagnostic and therapeutic right thoracentesis yielding 1.5 liters of pleural fluid. Read by: Rowe Robert, PA-C Electronically Signed   By: Marybelle Killings M.D.   On: 09/25/2016 16:15     Dg Chest 2 View Result Date: 09/24/2016 Right pleural effusion with right base atelectasis. Lungs elsewhere clear. There is aortic atherosclerosis.   Ct Angio Chest Pe W Or Wo Contrast Result Date: 09/25/2016 1. No evidence of significant pulmonary embolus. 2. Moderate right-sided pleural effusion, with partial consolidation at the right lung base. This could reflect pneumonia. 3. Apparent 1.7 x 1.0 cm pleural-based nodule anteriorly at the right middle lobe. Malignancy cannot be excluded. PET/CT would be helpful for further evaluation. Alternatively, diagnostic thoracentesis might be considered. 4. **An incidental finding of potential clinical significance has been found. 1.7 cm hypoattenuating nodule at the left thyroid lobe. Consider further evaluation with thyroid ultrasound. If patient is clinically hyperthyroid, consider nuclear medicine thyroid uptake and scan.** 5. Scattered coronary artery calcifications seen. 6. 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    Scheduled Meds: . atorvastatin  40 mg Oral q1800  . azithromycin  500 mg Oral Daily  . cefTRIAXone (ROCEPHIN)  IV  1 g Intravenous Q24H  . folic acid  1 mg Oral Daily  . irbesartan  300 mg Oral Daily   And  . hydrochlorothiazide  12.5 mg Oral Daily  . ipratropium-albuterol  3 mL Nebulization TID  . metoprolol tartrate  25 mg Oral BID  . mometasone-formoterol  2 puff Inhalation BID  . potassium chloride  40 mEq Oral Once  .  predniSONE  10 mg Oral Q breakfast  . pregabalin  200 mg Oral QHS  . rOPINIRole  0.25 mg Oral QHS  . sodium chloride flush  3 mL Intravenous Q12H   Continuous Infusions:  LOS: 3 days    Time spent: 15 minutes  Greater than 50% of the time spent on counseling and coordinating the care.   Leisa Lenz, MD [facility] Pager [phone]  If 7PM-7AM, please contact night-coverage [email or web address] Password [facility] 09/27/2016, 10:57 AM

## 2016-09-28 ENCOUNTER — Inpatient Hospital Stay (HOSPITAL_COMMUNITY): Payer: Medicare Other

## 2016-09-28 LAB — BASIC METABOLIC PANEL
ANION GAP: 8 (ref 5–15)
BUN: 9 mg/dL (ref 6–20)
CHLORIDE: 105 mmol/L (ref 101–111)
CO2: 28 mmol/L (ref 22–32)
Calcium: 8.6 mg/dL — ABNORMAL LOW (ref 8.9–10.3)
Creatinine, Ser: 0.65 mg/dL (ref 0.44–1.00)
GFR calc Af Amer: >60 mL/min (ref >60)
Glucose, Bld: 113 mg/dL — ABNORMAL HIGH (ref 65–99)
POTASSIUM: 3.5 mmol/L (ref 3.5–5.1)
SODIUM: 141 mmol/L (ref 135–145)

## 2016-09-28 LAB — GLUCOSE, CAPILLARY: GLUCOSE-CAPILLARY: 134 mg/dL — AB (ref 65–99)

## 2016-09-28 LAB — MAGNESIUM: MAGNESIUM: 2 mg/dL (ref 1.7–2.4)

## 2016-09-28 NOTE — Progress Notes (Signed)
Patient ID: [name], female   DOB: 1946-03-03, 70 y.o.   MRN: [id number]  PROGRESS NOTE    [name]  VXB:[id number] DOB: 01-06-1946 DOA: 09/24/2016  PCP: Merrilee Seashore, MD   Brief Narrative:  70 y.o. female with past medical history significant for hypertension, COPD not on home oxygen, history of right breast malignancy, status post lumpectomy and radiation therapy about 30 years ago, ex-smoker, recently diagnosed with rheumatoid arthritis (on weekly methotrexate and daily prednisone). She presented to ED with right-sided chest pain for 2-3 weeks PTA. Pain is sharp, on her right chest and radiates to back. Her symptoms gradually worsened. Shortness of breath also worsened in past 24 hours PTA.  In ED, patient was hypoxic with O2 sats in 86% range on room air. Chest x-ray was consistent with right-sided pleural effusion. Treated with ceftriaxone and azithromycin empirically.  Assessment & Plan:   Acute respiratory failure with hypoxia in the setting of new onset right-sided pleural effusion - CT angio chest showed no PE - Moderate right-sided pleural effusion, with partial consolidation at the right lung base, could reflect pneumonia. Apparent 1.7 x 1.0 cm pleural-based nodule anteriorly at th right middle lobe. Malignancy cannot be excluded. PET/CT would be helpful for further evaluation.  - S/p thoracentesis 11/22 with 1.5 L fluid removed and sent for analysis - exudative effusion, per PCCM appropriate to cover for parapneumonic effusion until cytology results are available. - CXR 11/22 showed aortic atherosclerosis. Right pleural effusion has resolved status post thoracentesis. No pneumothorax is noted. - Repeat CXR this am, pt with more pain on the right side   Right lower lobe pneumonia, unspecified organism / Leukocytosis  - On empiric azithromycin and rocephin  - Blood cx negative so far   Incidental thyroid nodule, left lobe - 1.7 cm hypoattenuating nodule at the left  thyroid lobe. - TSH 0.28  - Patient clinically does not look hyperthyroid but today she does have tachycardia - Free T3 and T4 are WNL so this could be more of a euthyroid  - Thyroid US showed solid and cystic nodules in the thyroid. Largest nodule measures 1.6 cm in the superior right lobe. This nodule meets criteria for 1 year follow-up. - Started low dose metoprolol   Hypokalemia - Due to nebulizer treatment - Supplemented and this am WNL  Essential hypertension - Continue Hctz and Avapro  History of COPD - Continue current bronchodilators   Recently diagnosed rheumatoid arthritis - Continue low-dose steroid   DVT prophylaxis: SCD's bilaterally  Code Status: full code  Family Communication: no family at the bedside this am Disposition Plan: home once cleared by pulm    Consultants:   Pulmonary   IR   Procedures:   Thoracentesis 09/25/2016   Antimicrobials:   Azithromycin and rocephin 09/24/2016 -->    Subjective: More pain on the right side this am.  Objective: Vitals:   09/27/16 2112 09/28/16 0613 09/28/16 0745 09/28/16 0746  BP: 119/62 (!) 105/48    Pulse: 91 78    Resp: 17 18    Temp: 98.3 F (36.8 C) 98.1 F (36.7 C)    TempSrc: Oral Oral    SpO2: 96% 95% 95% 95%  Weight:      Height:        Intake/Output Summary (Last 24 hours) at 09/28/16 1032 Last data filed at 09/27/16 1800  Gross per 24 hour  Intake              460 ml  Output  0 ml  Net              460 ml   Filed Weights   09/24/16 1637  Weight: 109.6 kg (241 lb 10 oz)    Examination:  General exam: No distress  Respiratory system: Diminished breath sounds on right side, no rhonchi  Cardiovascular system: S1 & S2 heard, rate controlled  Gastrointestinal system: (+) BS, no tenderness  Central nervous system: No focal deficits  Extremities: No swelling, palpable pulses bilaterally  Skin: no lesions or ulcers  Psychiatry: Normal mood and affect  Data  Reviewed: I have personally reviewed following labs and imaging studies  CBC:  Recent Labs Lab 09/24/16 1305 09/25/16 0353 09/26/16 0338 09/27/16 0322  WBC 12.6* 11.9* 11.1* 11.2*  NEUTROABS 10.8*  --   --   --   HGB 12.3 11.0* 10.4* 10.3*  HCT 39.0 36.9 34.2* 33.7*  MCV 90.5 93.4 91.7 91.1  PLT 367 401* 361 737   Basic Metabolic Panel:  Recent Labs Lab 09/24/16 1305 09/25/16 0353 09/26/16 0338 09/27/16 0322 09/28/16 0546  NA 139 142 141 139 141  K 3.6 3.2* 3.0* 2.9* 3.5  CL 99* 103 103 105 105  CO2 31 32 '31 27 28  '$ GLUCOSE 177* 120* 116* 102* 113*  BUN '10 10 15 11 9  '$ CREATININE 0.72 0.68 0.68 0.58 0.65  CALCIUM 9.2 8.7* 8.4* 8.4* 8.6*  MG  --   --   --   --  2.0   GFR: Estimated Creatinine Clearance: 79.7 mL/min (by C-G formula based on SCr of 0.65 mg/dL). Liver Function Tests: No results for input(s): AST, ALT, ALKPHOS, BILITOT, PROT, ALBUMIN in the last 168 hours. No results for input(s): LIPASE, AMYLASE in the last 168 hours. No results for input(s): AMMONIA in the last 168 hours. Coagulation Profile:  Recent Labs Lab 09/25/16 0353  INR 0.97   Cardiac Enzymes: No results for input(s): CKTOTAL, CKMB, CKMBINDEX, TROPONINI in the last 168 hours. BNP (last 3 results) No results for input(s): PROBNP in the last 8760 hours. HbA1C: No results for input(s): HGBA1C in the last 72 hours. CBG:  Recent Labs Lab 09/25/16 0749 09/26/16 0751 09/27/16 0808 09/27/16 1222 09/28/16 0816  GLUCAP 130* 128* 104* 159* 134*   Lipid Profile: No results for input(s): CHOL, HDL, LDLCALC, TRIG, CHOLHDL, LDLDIRECT in the last 72 hours. Thyroid Function Tests:  Recent Labs  09/26/16 0338 09/26/16 1210  TSH 0.280*  --   FREET4  --  0.92   Anemia Panel: No results for input(s): VITAMINB12, FOLATE, FERRITIN, TIBC, IRON, RETICCTPCT in the last 72 hours. Urine analysis:    Component Value Date/Time   COLORURINE YELLOW 04/30/2011 1340   APPEARANCEUR CLEAR 04/30/2011  1340   LABSPEC 1.014 04/30/2011 1340   PHURINE 6.5 04/30/2011 1340   GLUCOSEU NEGATIVE 04/30/2011 1340   HGBUR NEGATIVE 04/30/2011 1340   BILIRUBINUR NEGATIVE 04/30/2011 1340   KETONESUR NEGATIVE 04/30/2011 1340   PROTEINUR NEGATIVE 04/30/2011 1340   UROBILINOGEN 0.2 04/30/2011 1340   NITRITE NEGATIVE 04/30/2011 1340   LEUKOCYTESUR NEGATIVE 04/30/2011 1340   Sepsis Labs: '@LABRCNTIP'$ (procalcitonin:4,lacticidven:4)    Recent Results (from the past 240 hour(s))  Culture, blood (routine x 2)     Status: None (Preliminary result)   Collection Time: 09/24/16  1:44 PM  Result Value Ref Range Status   Specimen Description BLOOD RIGHT ANTECUBITAL  Final   Special Requests BOTTLES DRAWN AEROBIC AND ANAEROBIC 5CC  Final   Culture   Final  NO GROWTH 3 DAYS Performed at [facility]    Report Status PENDING  Incomplete  Culture, blood (routine x 2)     Status: None (Preliminary result)   Collection Time: 09/24/16  1:58 PM  Result Value Ref Range Status   Specimen Description BLOOD LEFT ANTECUBITAL  Final   Special Requests BOTTLES DRAWN AEROBIC AND ANAEROBIC 5CC  Final   Culture   Final    NO GROWTH 3 DAYS Performed at [facility]    Report Status PENDING  Incomplete  Culture, body fluid-bottle     Status: None (Preliminary result)   Collection Time: 09/25/16  3:48 PM  Result Value Ref Range Status   Specimen Description FLUID PLEURAL RIGHT  Final   Special Requests BOTTLES DRAWN AEROBIC AND ANAEROBIC 10CC  Final   Culture   Final    NO GROWTH 2 DAYS Performed at [facility]    Report Status PENDING  Incomplete  Gram stain     Status: None   Collection Time: 09/25/16  3:48 PM  Result Value Ref Range Status   Specimen Description FLUID PLEURAL RIGHT  Final   Special Requests NONE  Final   Gram Stain   Final    WBC PRESENT, PREDOMINANTLY MONONUCLEAR NO ORGANISMS SEEN CYTOSPIN Performed at [facility]    Report Status 09/25/2016 FINAL  Final        Radiology Studies: Dg Chest 1 View Result Date: 09/25/2016 Aortic atherosclerosis. Right pleural effusion has resolved status post thoracentesis. No pneumothorax is noted. Electronically Signed   By: Marijo Conception, M.D.   On: 09/25/2016 16:44   US Thoracentesis Asp Pleural Space W/img Guide Result Date: 09/25/2016 Successful ultrasound guided diagnostic and therapeutic right thoracentesis yielding 1.5 liters of pleural fluid. Read by: Rowe Robert, PA-C Electronically Signed   By: Marybelle Killings M.D.   On: 09/25/2016 16:15     Dg Chest 2 View Result Date: 09/24/2016 Right pleural effusion with right base atelectasis. Lungs elsewhere clear. There is aortic atherosclerosis.   Ct Angio Chest Pe W Or Wo Contrast Result Date: 09/25/2016 1. No evidence of significant pulmonary embolus. 2. Moderate right-sided pleural effusion, with partial consolidation at the right lung base. This could reflect pneumonia. 3. Apparent 1.7 x 1.0 cm pleural-based nodule anteriorly at the right middle lobe. Malignancy cannot be excluded. PET/CT would be helpful for further evaluation. Alternatively, diagnostic thoracentesis might be considered. 4. **An incidental finding of potential clinical significance has been found. 1.7 cm hypoattenuating nodule at the left thyroid lobe. Consider further evaluation with thyroid ultrasound. If patient is clinically hyperthyroid, consider nuclear medicine thyroid uptake and scan.** 5. Scattered coronary artery calcifications seen. 6. 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    Scheduled Meds: . atorvastatin  40 mg Oral q1800  . azithromycin  500 mg Oral Daily  . cefTRIAXone (ROCEPHIN)  IV  1 g Intravenous Q24H  . folic acid  1 mg Oral Daily  . irbesartan  300 mg Oral Daily   And  . hydrochlorothiazide  12.5 mg Oral Daily  . ipratropium-albuterol  3 mL Nebulization BID  . metoprolol tartrate  25 mg Oral BID  . mometasone-formoterol  2 puff Inhalation BID  . predniSONE   10 mg Oral Q breakfast  . pregabalin  200 mg Oral QHS  . rOPINIRole  0.25 mg Oral QHS  . sodium chloride flush  3 mL Intravenous Q12H   Continuous Infusions:   LOS: 4 days  Time spent: 15 minutes  Greater than 50% of the time spent on counseling and coordinating the care.   Leisa Lenz, MD [facility] Pager [phone]  If 7PM-7AM, please contact night-coverage [email or web address] Password [facility] 09/28/2016, 10:32 AM

## 2016-09-29 LAB — CULTURE, BLOOD (ROUTINE X 2)
CULTURE: NO GROWTH
CULTURE: NO GROWTH

## 2016-09-29 LAB — GLUCOSE, CAPILLARY: GLUCOSE-CAPILLARY: 113 mg/dL — AB (ref 65–99)

## 2016-09-29 NOTE — Progress Notes (Addendum)
Patient ID: [name], female   DOB: 1946/03/26, 70 y.o.   MRN: [id number]  PROGRESS NOTE    [name]  [id number]:[id number] DOB: Mar 13, 1946 DOA: 09/24/2016  PCP: Merrilee Seashore, MD   Brief Narrative:  70 y.o. female with past medical history significant for hypertension, COPD not on home oxygen, history of right breast malignancy, status post lumpectomy and radiation therapy about 30 years ago, ex-smoker, recently diagnosed with rheumatoid arthritis (on weekly methotrexate and daily prednisone). She presented to ED with right-sided chest pain for 2-3 weeks PTA. Pain is sharp, on her right chest and radiates to back. Her symptoms gradually worsened. Shortness of breath also worsened in past 24 hours PTA.  In ED, patient was hypoxic with O2 sats in 86% range on room air. Chest x-ray was consistent with right-sided pleural effusion. Treated with ceftriaxone and azithromycin empirically.  Assessment & Plan:   Acute respiratory failure with hypoxia in the setting of new onset right-sided pleural effusion - Pt had CT angio chest on admission which showed no PE but there was moderate right-sided pleural effusion, with partial consolidation at the right lung base, could reflect pneumonia. Apparent 1.7 x 1.0 cm pleural-based nodule anteriorly at th right middle lobe. Malignancy cannot be excluded. PET/CT would be helpful for further evaluation.  - Pt is s/p thoracentesis 11/22 with 1.5 L fluid removed and sent for analysis - exudative effusion, per PCCM appropriate to cover for parapneumonic effusion until cytology results are available. Cytology pending as of 11/26 - CXR 11/22 showed right pleural effusion has resolved status post thoracentesis. No pneumothorax is noted. - Repeat chest x-ray - obscuration of the right hemidiaphragm with some blunting of the right lateral costophrenic angles which could reflect recurrent right pleural effusion with passive atelectasis versus right basilar pneumonia -  Stable respiratory status this am  Right lower lobe pneumonia, unspecified organism / Leukocytosis  - Continue empiric azithromycin and rocephin  - Blood cx negative so far  - Follow-up CBC tomorrow morning  Incidental thyroid nodule, left lobe - 1.7 cm hypoattenuating nodule at the left thyroid lobe. - TSH 0.28  - Patient clinically does not look hyperthyroid but today she does have tachycardia - Free T3 and T4 are WNL so this could be more of a euthyroid  - Thyroid US showed solid and cystic nodules in the thyroid. Largest nodule measures 1.6 cm in the superior right lobe. This nodule meets criteria for 1 year follow-up. - Started low dose metoprolol which has been controlling heart rate pretty nicely, her heart rate is morning is 93  Hypokalemia - Due to nebulizer treatment - Supplemented and WNL - We'll check BMP tomorrow morning  Essential hypertension - Continue Hctz and Avapro  History of COPD - Continue current bronchodilators   Recently diagnosed rheumatoid arthritis - Continue low-dose steroid   DVT prophylaxis: SCD's bilaterally  Code Status: full code  Family Communication: no family at the bedside this am Disposition Plan: home once cleared by pulm    Consultants:   Pulmonary   IR   Procedures:   Thoracentesis 09/25/2016   Antimicrobials:   Azithromycin and rocephin 09/24/2016 -->    Subjective: Feels better this morning.  Objective: Vitals:   09/29/16 0430 09/29/16 0449 09/29/16 0753 09/29/16 0756  BP:  (!) 157/50    Pulse:  93    Resp:  20    Temp:  99.1 F (37.3 C)    TempSrc: Oral Oral    SpO2:  94% 91% 91%  Weight:      Height:        Intake/Output Summary (Last 24 hours) at 09/29/16 1026 Last data filed at 09/28/16 1734  Gross per 24 hour  Intake              600 ml  Output                0 ml  Net              600 ml   Filed Weights   09/24/16 1637  Weight: 109.6 kg (241 lb 10 oz)    Examination:  General exam: No  acute distress, comfortable Respiratory system: Diminished breath sounds on right side, no rhonchi  Cardiovascular system: S1 & S2 heard, RRR Gastrointestinal system: (+) BS, abdomen nontender and not distended Central nervous system: Nonfocal Extremities: No swelling, palpable pulses bilaterally  Skin: skin is warm and dry  Psychiatry: Normal mood and affect, no agitation, no restlessness  Data Reviewed: I have personally reviewed following labs and imaging studies  CBC:  Recent Labs Lab 09/24/16 1305 09/25/16 0353 09/26/16 0338 09/27/16 0322  WBC 12.6* 11.9* 11.1* 11.2*  NEUTROABS 10.8*  --   --   --   HGB 12.3 11.0* 10.4* 10.3*  HCT 39.0 36.9 34.2* 33.7*  MCV 90.5 93.4 91.7 91.1  PLT 367 401* 361 923   Basic Metabolic Panel:  Recent Labs Lab 09/24/16 1305 09/25/16 0353 09/26/16 0338 09/27/16 0322 09/28/16 0546  NA 139 142 141 139 141  K 3.6 3.2* 3.0* 2.9* 3.5  CL 99* 103 103 105 105  CO2 31 32 '31 27 28  '$ GLUCOSE 177* 120* 116* 102* 113*  BUN '10 10 15 11 9  '$ CREATININE 0.72 0.68 0.68 0.58 0.65  CALCIUM 9.2 8.7* 8.4* 8.4* 8.6*  MG  --   --   --   --  2.0   GFR: Estimated Creatinine Clearance: 79.7 mL/min (by C-G formula based on SCr of 0.65 mg/dL). Liver Function Tests: No results for input(s): AST, ALT, ALKPHOS, BILITOT, PROT, ALBUMIN in the last 168 hours. No results for input(s): LIPASE, AMYLASE in the last 168 hours. No results for input(s): AMMONIA in the last 168 hours. Coagulation Profile:  Recent Labs Lab 09/25/16 0353  INR 0.97   Cardiac Enzymes: No results for input(s): CKTOTAL, CKMB, CKMBINDEX, TROPONINI in the last 168 hours. BNP (last 3 results) No results for input(s): PROBNP in the last 8760 hours. HbA1C: No results for input(s): HGBA1C in the last 72 hours. CBG:  Recent Labs Lab 09/26/16 0751 09/27/16 0808 09/27/16 1222 09/28/16 0816 09/29/16 0429  GLUCAP 128* 104* 159* 134* 113*   Lipid Profile: No results for input(s): CHOL,  HDL, LDLCALC, TRIG, CHOLHDL, LDLDIRECT in the last 72 hours. Thyroid Function Tests:  Recent Labs  09/26/16 1210  FREET4 0.92   Anemia Panel: No results for input(s): VITAMINB12, FOLATE, FERRITIN, TIBC, IRON, RETICCTPCT in the last 72 hours. Urine analysis:    Component Value Date/Time   COLORURINE YELLOW 04/30/2011 1340   APPEARANCEUR CLEAR 04/30/2011 1340   LABSPEC 1.014 04/30/2011 1340   PHURINE 6.5 04/30/2011 1340   GLUCOSEU NEGATIVE 04/30/2011 1340   HGBUR NEGATIVE 04/30/2011 1340   BILIRUBINUR NEGATIVE 04/30/2011 1340   KETONESUR NEGATIVE 04/30/2011 1340   PROTEINUR NEGATIVE 04/30/2011 1340   UROBILINOGEN 0.2 04/30/2011 1340   NITRITE NEGATIVE 04/30/2011 1340   LEUKOCYTESUR NEGATIVE 04/30/2011 1340   Sepsis Labs: '@LABRCNTIP'$ (procalcitonin:4,lacticidven:4)    Recent Results (from the  past 240 hour(s))  Culture, blood (routine x 2)     Status: None (Preliminary result)   Collection Time: 09/24/16  1:44 PM  Result Value Ref Range Status   Specimen Description BLOOD RIGHT ANTECUBITAL  Final   Special Requests BOTTLES DRAWN AEROBIC AND ANAEROBIC 5CC  Final   Culture   Final    NO GROWTH 4 DAYS Performed at [facility]    Report Status PENDING  Incomplete  Culture, blood (routine x 2)     Status: None (Preliminary result)   Collection Time: 09/24/16  1:58 PM  Result Value Ref Range Status   Specimen Description BLOOD LEFT ANTECUBITAL  Final   Special Requests BOTTLES DRAWN AEROBIC AND ANAEROBIC 5CC  Final   Culture   Final    NO GROWTH 4 DAYS Performed at [facility]    Report Status PENDING  Incomplete  Culture, body fluid-bottle     Status: None (Preliminary result)   Collection Time: 09/25/16  3:48 PM  Result Value Ref Range Status   Specimen Description FLUID PLEURAL RIGHT  Final   Special Requests BOTTLES DRAWN AEROBIC AND ANAEROBIC 10CC  Final   Culture   Final    NO GROWTH 3 DAYS Performed at [facility]    Report Status  PENDING  Incomplete  Gram stain     Status: None   Collection Time: 09/25/16  3:48 PM  Result Value Ref Range Status   Specimen Description FLUID PLEURAL RIGHT  Final   Special Requests NONE  Final   Gram Stain   Final    WBC PRESENT, PREDOMINANTLY MONONUCLEAR NO ORGANISMS SEEN CYTOSPIN Performed at [facility]    Report Status 09/25/2016 FINAL  Final      Radiology Studies: Dg Chest 1 View Result Date: 09/25/2016 Aortic atherosclerosis. Right pleural effusion has resolved status post thoracentesis. No pneumothorax is noted. Electronically Signed   By: Marijo Conception, M.D.   On: 09/25/2016 16:44   US Thoracentesis Asp Pleural Space W/img Guide Result Date: 09/25/2016 Successful ultrasound guided diagnostic and therapeutic right thoracentesis yielding 1.5 liters of pleural fluid. Read by: Rowe Robert, PA-C Electronically Signed   By: Marybelle Killings M.D.   On: 09/25/2016 16:15     Dg Chest 2 View Result Date: 09/24/2016 Right pleural effusion with right base atelectasis. Lungs elsewhere clear. There is aortic atherosclerosis.   Ct Angio Chest Pe W Or Wo Contrast Result Date: 09/25/2016 1. No evidence of significant pulmonary embolus. 2. Moderate right-sided pleural effusion, with partial consolidation at the right lung base. This could reflect pneumonia. 3. Apparent 1.7 x 1.0 cm pleural-based nodule anteriorly at the right middle lobe. Malignancy cannot be excluded. PET/CT would be helpful for further evaluation. Alternatively, diagnostic thoracentesis might be considered. 4. **An incidental finding of potential clinical significance has been found. 1.7 cm hypoattenuating nodule at the left thyroid lobe. Consider further evaluation with thyroid ultrasound. If patient is clinically hyperthyroid, consider nuclear medicine thyroid uptake and scan.** 5. Scattered coronary artery calcifications seen. 6. 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    Scheduled Meds: . atorvastatin  40  mg Oral q1800  . azithromycin  500 mg Oral Daily  . cefTRIAXone (ROCEPHIN)  IV  1 g Intravenous Q24H  . folic acid  1 mg Oral Daily  . irbesartan  300 mg Oral Daily   And  . hydrochlorothiazide  12.5 mg Oral Daily  . ipratropium-albuterol  3 mL Nebulization BID  .  metoprolol tartrate  25 mg Oral BID  . mometasone-formoterol  2 puff Inhalation BID  . predniSONE  10 mg Oral Q breakfast  . pregabalin  200 mg Oral QHS  . rOPINIRole  0.25 mg Oral QHS  . sodium chloride flush  3 mL Intravenous Q12H   Continuous Infusions:   LOS: 5 days    Time spent: 25 minutes  Greater than 50% of the time spent on counseling and coordinating the care.   Leisa Lenz, MD [facility] Pager [phone]  If 7PM-7AM, please contact night-coverage [email or web address] Password [email or web address] 09/29/2016, 10:26 AM

## 2016-09-30 LAB — CBC
HCT: 34.5 % — ABNORMAL LOW (ref 36.0–46.0)
Hemoglobin: 10.4 g/dL — ABNORMAL LOW (ref 12.0–15.0)
MCH: 27.9 pg (ref 26.0–34.0)
MCHC: 30.1 g/dL (ref 30.0–36.0)
MCV: 92.5 fL (ref 78.0–100.0)
PLATELETS: 433 10*3/uL — AB (ref 150–400)
RBC: 3.73 MIL/uL — AB (ref 3.87–5.11)
RDW: 16.2 % — AB (ref 11.5–15.5)
WBC: 11.1 10*3/uL — ABNORMAL HIGH (ref 4.0–10.5)

## 2016-09-30 LAB — CULTURE, BODY FLUID-BOTTLE: CULTURE: NO GROWTH

## 2016-09-30 LAB — BASIC METABOLIC PANEL
Anion gap: 5 (ref 5–15)
BUN: 12 mg/dL (ref 6–20)
CALCIUM: 8.6 mg/dL — AB (ref 8.9–10.3)
CO2: 31 mmol/L (ref 22–32)
CREATININE: 0.71 mg/dL (ref 0.44–1.00)
Chloride: 104 mmol/L (ref 101–111)
GFR calc Af Amer: >60 mL/min (ref >60)
Glucose, Bld: 111 mg/dL — ABNORMAL HIGH (ref 65–99)
POTASSIUM: 3.2 mmol/L — AB (ref 3.5–5.1)
SODIUM: 140 mmol/L (ref 135–145)

## 2016-09-30 LAB — CULTURE, BODY FLUID W GRAM STAIN -BOTTLE

## 2016-09-30 MED ORDER — POTASSIUM CHLORIDE CRYS ER 20 MEQ PO TBCR
40.0000 meq | EXTENDED_RELEASE_TABLET | Freq: Once | ORAL | Status: AC
  Administered 2016-09-30: 40 meq via ORAL
  Filled 2016-09-30: qty 2

## 2016-09-30 NOTE — Progress Notes (Signed)
SATURATION QUALIFICATIONS: (This note is used to comply with regulatory documentation for home oxygen)  Patient Saturations on Room Air at Rest = 90%  Patient Saturations on Room Air while Ambulating = 84%  Patient Saturations on 2 Liters of oxygen while Ambulating = 91%  Please briefly explain why patient needs home oxygen: pt desat on room air while ambulating

## 2016-09-30 NOTE — Care Management Important Message (Signed)
Important Message  Patient Details  Name: Anber Mckiver MRN: [id number] Date of Birth: 01/09/1946   Medicare Important Message Given:  Yes    Camillo Flaming 09/30/2016, 10:36 AMImportant Message  Patient Details  Name: Sheela Mcculley MRN: [id number] Date of Birth: Oct 25, 1946   Medicare Important Message Given:  Yes    Camillo Flaming 09/30/2016, 10:36 AM

## 2016-09-30 NOTE — Progress Notes (Signed)
Patient ID: [name], female   DOB: 02/19/1946, 70 y.o.   MRN: [id number]  PROGRESS NOTE    [name]  [id number]:[id number] DOB: 04-Sep-1946 DOA: 09/24/2016  PCP: Merrilee Seashore, MD   Brief Narrative:  70 y.o. female with past medical history significant for hypertension, COPD not on home oxygen, history of right breast malignancy, status post lumpectomy and radiation therapy about 30 years ago, ex-smoker, recently diagnosed with rheumatoid arthritis (on weekly methotrexate and daily prednisone). She presented to ED with right-sided chest pain for 2-3 weeks PTA. Pain is sharp, on her right chest and radiates to back. Her symptoms gradually worsened. Shortness of breath also worsened in past 24 hours PTA.  In ED, patient was hypoxic with O2 sats in 86% range on room air. Chest x-ray was consistent with right-sided pleural effusion. Treated with ceftriaxone and azithromycin empirically.  Assessment & Plan:   Acute respiratory failure with hypoxia in the setting of new onset right-sided pleural effusion - CT angio chest - no PE but there was moderate right-sided pleural effusion, with partial consolidation at the right lung base, could reflect pneumonia. Apparent 1.7 x 1.0 cm pleural-based nodule anteriorly at th right middle lobe. Malignancy cannot be excluded. PET/CT would be helpful for further evaluation.  - Per pulm - if malignancy then pt might need VATS; if non diagnostic pt will need CT guided bx of the nodule mentioned above  - Thoracentesis 11/22 - 1.5 L fluid removed and sent for analysis - exudative effusion, per PCCM appropriate to cover for parapneumonic effusion until cytology results are available. Cytology pending as of 11/27 - CXR 11/22 showed right pleural effusion has resolved status post thoracentesis. No pneumothorax noted. - Repeat chest x-ray showed obscuration of the right hemidiaphragm with some blunting of the right lateral costophrenic angles which could reflect recurrent  right pleural effusion with passive atelectasis versus right basilar pneumonia  Right lower lobe pneumonia, unspecified organism / Leukocytosis  - Continue empiric azithromycin and rocephin  - Blood cx negative so far   Incidental thyroid nodule, left lobe - 1.7 cm hypoattenuating nodule at the left thyroid lobe. - TSH 0.28  - Patient clinically does not look hyperthyroid but today she does have tachycardia - Free T3 and T4 are WNL so this could be more of a euthyroid  - Thyroid US showed solid and cystic nodules in the thyroid. Largest nodule measures 1.6 cm in the superior right lobe. This nodule meets criteria for 1 year follow up - Started low dose metoprolol which has been controlling heart rate pretty nicely, her heart rate is morning is 93  Hypokalemia - Due to nebulizer treatment - Supplemented  Essential hypertension - Continue Hctz and Avapro  History of COPD - Continue current bronchodilators   Recently diagnosed rheumatoid arthritis - Continue low-dose steroid   DVT prophylaxis: SCD's bilaterally  Code Status: full code  Family Communication: no family at the bedside this am Disposition Plan: home once cleared by pulm    Consultants:   Pulmonary   IR   Procedures:   Thoracentesis 09/25/2016   Antimicrobials:   Azithromycin and rocephin 09/24/2016 -->    Subjective: No overnight events.  Objective: Vitals:   09/30/16 0907 09/30/16 0923 09/30/16 0939 09/30/16 1501  BP: 117/78   100/61  Pulse:    75  Resp:    18  Temp:    97.5 F (36.4 C)  TempSrc:    Oral  SpO2:  (!) 80% 91% 93%  Weight:  Height:        Intake/Output Summary (Last 24 hours) at 09/30/16 1632 Last data filed at 09/30/16 0200  Gross per 24 hour  Intake              140 ml  Output                0 ml  Net              140 ml   Filed Weights   09/24/16 1637  Weight: 109.6 kg (241 lb 10 oz)    Examination:  General exam: No acute distress Respiratory system:  Diminished breath sounds on right side Cardiovascular system: S1 & S2 heard, rate controlled  Gastrointestinal system: (+) BS, abdomen nontender Central nervous system: No focal deficits  Extremities: No swelling, palpable pulses bilaterally  Skin: no ulcers or lesions  Psychiatry: No agitation, no restlessness  Data Reviewed: I have personally reviewed following labs and imaging studies  CBC:  Recent Labs Lab 09/24/16 1305 09/25/16 0353 09/26/16 0338 09/27/16 0322 09/30/16 0449  WBC 12.6* 11.9* 11.1* 11.2* 11.1*  NEUTROABS 10.8*  --   --   --   --   HGB 12.3 11.0* 10.4* 10.3* 10.4*  HCT 39.0 36.9 34.2* 33.7* 34.5*  MCV 90.5 93.4 91.7 91.1 92.5  PLT 367 401* 361 362 948*   Basic Metabolic Panel:  Recent Labs Lab 09/25/16 0353 09/26/16 0338 09/27/16 0322 09/28/16 0546 09/30/16 0449  NA 142 141 139 141 140  K 3.2* 3.0* 2.9* 3.5 3.2*  CL 103 103 105 105 104  CO2 32 '31 27 28 31  '$ GLUCOSE 120* 116* 102* 113* 111*  BUN '10 15 11 9 12  '$ CREATININE 0.68 0.68 0.58 0.65 0.71  CALCIUM 8.7* 8.4* 8.4* 8.6* 8.6*  MG  --   --   --  2.0  --    GFR: Estimated Creatinine Clearance: 79.7 mL/min (by C-G formula based on SCr of 0.71 mg/dL). Liver Function Tests: No results for input(s): AST, ALT, ALKPHOS, BILITOT, PROT, ALBUMIN in the last 168 hours. No results for input(s): LIPASE, AMYLASE in the last 168 hours. No results for input(s): AMMONIA in the last 168 hours. Coagulation Profile:  Recent Labs Lab 09/25/16 0353  INR 0.97   Cardiac Enzymes: No results for input(s): CKTOTAL, CKMB, CKMBINDEX, TROPONINI in the last 168 hours. BNP (last 3 results) No results for input(s): PROBNP in the last 8760 hours. HbA1C: No results for input(s): HGBA1C in the last 72 hours. CBG:  Recent Labs Lab 09/26/16 0751 09/27/16 0808 09/27/16 1222 09/28/16 0816 09/29/16 0429  GLUCAP 128* 104* 159* 134* 113*   Lipid Profile: No results for input(s): CHOL, HDL, LDLCALC, TRIG, CHOLHDL,  LDLDIRECT in the last 72 hours. Thyroid Function Tests: No results for input(s): TSH, T4TOTAL, FREET4, T3FREE, THYROIDAB in the last 72 hours. Anemia Panel: No results for input(s): VITAMINB12, FOLATE, FERRITIN, TIBC, IRON, RETICCTPCT in the last 72 hours. Urine analysis:    Component Value Date/Time   COLORURINE YELLOW 04/30/2011 1340   APPEARANCEUR CLEAR 04/30/2011 1340   LABSPEC 1.014 04/30/2011 1340   PHURINE 6.5 04/30/2011 1340   GLUCOSEU NEGATIVE 04/30/2011 1340   HGBUR NEGATIVE 04/30/2011 1340   BILIRUBINUR NEGATIVE 04/30/2011 1340   KETONESUR NEGATIVE 04/30/2011 1340   PROTEINUR NEGATIVE 04/30/2011 1340   UROBILINOGEN 0.2 04/30/2011 1340   NITRITE NEGATIVE 04/30/2011 1340   LEUKOCYTESUR NEGATIVE 04/30/2011 1340   Sepsis Labs: '@LABRCNTIP'$ (procalcitonin:4,lacticidven:4)    Recent Results (from the  past 240 hour(s))  Culture, blood (routine x 2)     Status: None   Collection Time: 09/24/16  1:44 PM  Result Value Ref Range Status   Specimen Description BLOOD RIGHT ANTECUBITAL  Final   Special Requests BOTTLES DRAWN AEROBIC AND ANAEROBIC 5CC  Final   Culture   Final    NO GROWTH 5 DAYS Performed at [facility]    Report Status 09/29/2016 FINAL  Final  Culture, blood (routine x 2)     Status: None   Collection Time: 09/24/16  1:58 PM  Result Value Ref Range Status   Specimen Description BLOOD LEFT ANTECUBITAL  Final   Special Requests BOTTLES DRAWN AEROBIC AND ANAEROBIC 5CC  Final   Culture   Final    NO GROWTH 5 DAYS Performed at [facility]    Report Status 09/29/2016 FINAL  Final  Culture, body fluid-bottle     Status: None   Collection Time: 09/25/16  3:48 PM  Result Value Ref Range Status   Specimen Description FLUID PLEURAL RIGHT  Final   Special Requests BOTTLES DRAWN AEROBIC AND ANAEROBIC 10CC  Final   Culture   Final    NO GROWTH 5 DAYS Performed at [facility]    Report Status 09/30/2016 FINAL  Final  Gram stain     Status:  None   Collection Time: 09/25/16  3:48 PM  Result Value Ref Range Status   Specimen Description FLUID PLEURAL RIGHT  Final   Special Requests NONE  Final   Gram Stain   Final    WBC PRESENT, PREDOMINANTLY MONONUCLEAR NO ORGANISMS SEEN CYTOSPIN Performed at [facility]    Report Status 09/25/2016 FINAL  Final      Radiology Studies: Dg Chest 1 View Result Date: 09/25/2016 Aortic atherosclerosis. Right pleural effusion has resolved status post thoracentesis. No pneumothorax is noted. Electronically Signed   By: Marijo Conception, M.D.   On: 09/25/2016 16:44   US Thoracentesis Asp Pleural Space W/img Guide Result Date: 09/25/2016 Successful ultrasound guided diagnostic and therapeutic right thoracentesis yielding 1.5 liters of pleural fluid. Read by: Rowe Robert, PA-C Electronically Signed   By: Marybelle Killings M.D.   On: 09/25/2016 16:15     Dg Chest 2 View Result Date: 09/24/2016 Right pleural effusion with right base atelectasis. Lungs elsewhere clear. There is aortic atherosclerosis.   Ct Angio Chest Pe W Or Wo Contrast Result Date: 09/25/2016 1. No evidence of significant pulmonary embolus. 2. Moderate right-sided pleural effusion, with partial consolidation at the right lung base. This could reflect pneumonia. 3. Apparent 1.7 x 1.0 cm pleural-based nodule anteriorly at the right middle lobe. Malignancy cannot be excluded. PET/CT would be helpful for further evaluation. Alternatively, diagnostic thoracentesis might be considered. 4. **An incidental finding of potential clinical significance has been found. 1.7 cm hypoattenuating nodule at the left thyroid lobe. Consider further evaluation with thyroid ultrasound. If patient is clinically hyperthyroid, consider nuclear medicine thyroid uptake and scan.** 5. Scattered coronary artery calcifications seen. 6. 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    Scheduled Meds: . atorvastatin  40 mg Oral q1800  . azithromycin  500 mg Oral  Daily  . cefTRIAXone (ROCEPHIN)  IV  1 g Intravenous Q24H  . folic acid  1 mg Oral Daily  . irbesartan  300 mg Oral Daily   And  . hydrochlorothiazide  12.5 mg Oral Daily  . ipratropium-albuterol  3 mL Nebulization BID  . metoprolol tartrate  25 mg Oral BID  . mometasone-formoterol  2 puff Inhalation BID  . predniSONE  10 mg Oral Q breakfast  . pregabalin  200 mg Oral QHS  . rOPINIRole  0.25 mg Oral QHS  . sodium chloride flush  3 mL Intravenous Q12H   Continuous Infusions:   LOS: 6 days    Time spent: 15 minutes  Greater than 50% of the time spent on counseling and coordinating the care.   Leisa Lenz, MD [facility] Pager [phone]  If 7PM-7AM, please contact night-coverage [email or web address] Password [facility] 09/30/2016, 4:32 PM

## 2016-09-30 NOTE — Progress Notes (Signed)
Name: [name] MRN: [id number] DOB: [birth year]    ADMISSION DATE:  09/24/2016 CONSULTATION DATE:  09/30/2016  REFERRING MD :  Toma Copier  CHIEF COMPLAINT:  Chest pain  brief  70 year old ex-smoker presented with right-sided sudden onset chest pain about 3 weeks ago. She was recently diagnosed with rheumatoid arthritis about 2 months ago and started on weekly methotrexate and daily prednisone with improvement in her hand stiffness. She was supposed to see her PCP Dr. Ashby Dawes today but developed increased respiratory distress and came into the emergency room. She was found to be hypoxic to 86% that improved with oxygen, chest x-ray showed a right-sided pleural effusion. She denies fevers or URI symptoms. Her chest pain is still present, nonpleuritic and nonradiating. She felt like she had broken her ribs. Her breathing suddenly got worse and she felt like she was not going to make it. She has been started on empiric ceftriaxone and azithromycin. She smoked about a pack per day for many years until she quit in 2015. She has a history of breast cancer 30 years ago to was treated with lumpectomy and radiation   has a past medical history of Cancer ([place]) and COPD (chronic obstructive pulmonary disease) ([place]).   has a past surgical history that includes Back surgery; Breast surgery (Right); and Abdominal hysterectomy (1990).   Events 11/222/17 . No evidence of significant pulmonary embolus. 2. Moderate right-sided pleural effusion, with partial consolidation at the right lung base. This could reflect pneumonia. 3. Apparent 1.7 x 1.0 cm pleural-based nodule anteriorly at the right middle lobe. Malignancy cannot be excluded 09/25/16 - A total of approximately 1.5 liters of hazy, amber fluid was removed. LDH 578 and exudate , 56% lymphs. NO GLUCOSE done 09/26/16  Less dyspnea, but some discomfort in chest with deep breath since thoracentesis.    SUBJECTIVE/OVERNIGHT/INTERVAL  HX 09/27/16: still on 3L:[place]. Says o 2 need is new in hospital. Was not at it at home. Since admit 09/24/2016 with LOS 6 =says despite rt thora overall not better interms of dyspnea but rt lateral infra-axillaly pain some better. Per Dr Charlies Silvers who spoke t opath - further anaylsis being done  VITAL SIGNS: Temp:  [97.8 F (36.6 C)-98.3 F (36.8 C)] 98.3 F (36.8 C) (11/27 0439) Pulse Rate:  [71-78] 73 (11/27 0439) Resp:  [18] 18 (11/27 0439) BP: (99-117)/(48-78) 117/78 (11/27 0907) SpO2:  [80 %-95 %] 91 % (11/27 0939)  PHYSICAL EXAMINATION: General: Obese female. No distress.  Sitting comfortabley ion 3L O2. Turned off at 10>:05am by MD HEENT:  Moist membranes. No scleral icterus or injection. No stridor  Pulmonary:  Dullness and few crackles deep R base. No rub. Cardiovascular: Regular rate & rhythm. No edema. Abdomen: Soft. Protuberant. Normal bowel sounds. Neurological:   Moving all 4 extremities. Oriented, non-focal    PULMONARY No results for input(s): PHART, PCO2ART, PO2ART, HCO3, TCO2, O2SAT in the last 168 hours.  Invalid input(s): PCO2, PO2  CBC  Recent Labs Lab 09/26/16 0338 09/27/16 0322 09/30/16 0449  HGB 10.4* 10.3* 10.4*  HCT 34.2* 33.7* 34.5*  WBC 11.1* 11.2* 11.1*  PLT 361 362 433*    COAGULATION  Recent Labs Lab 09/25/16 0353  INR 0.97    CARDIAC  No results for input(s): TROPONINI in the last 168 hours. No results for input(s): PROBNP in the last 168 hours.   CHEMISTRY  Recent Labs Lab 09/25/16 0353 09/26/16 0338 09/27/16 0322 09/28/16 0546 09/30/16 0449  NA 142 141 139 141 140  K 3.2* 3.0* 2.9* 3.5 3.2*  CL 103 103 105 105 104  CO2 32 '31 27 28 31  '$ GLUCOSE 120* 116* 102* 113* 111*  BUN '10 15 11 9 12  '$ CREATININE 0.68 0.68 0.58 0.65 0.71  CALCIUM 8.7* 8.4* 8.4* 8.6* 8.6*  MG  --   --   --  2.0  --    Estimated Creatinine Clearance: 79.7 mL/min (by C-G formula based on SCr of 0.71 mg/dL).   LIVER  Recent Labs Lab  09/25/16 0353  INR 0.97     INFECTIOUS  Recent Labs Lab 09/24/16 1315 09/24/16 1639  LATICACIDVEN 2.01* 2.8*     ENDOCRINE CBG (last 3)   Recent Labs  09/27/16 1222 09/28/16 0816 09/29/16 0429  GLUCAP 159* 134* 113*         IMAGING x48h  - image(s) personally visualized  -   highlighted in bold Dg Chest Port 1 View  Result Date: 09/28/2016 CLINICAL DATA:  Productive cough today EXAM: PORTABLE CHEST 1 VIEW COMPARISON:  09/25/2016 FINDINGS: There is new airspace opacity obscuring the right hemidiaphragm. Atherosclerotic calcification of the aortic arch. The left lung appears clear.  Upper normal heart size. IMPRESSION: 1. Obscuration of the right hemidiaphragm with some blunting of the right lateral costophrenic angle. This could reflect recurrent right pleural effusion with passive atelectasis although right basilar pneumonia is not readily excluded. 2. Atherosclerotic aortic arch. Electronically Signed   By: Van Clines M.D.   On: 09/28/2016 10:26      ASSESSMENT / PLAN:  70 y.o. female with right pleural effusion and history of Rheumatoid Arthritis and remote breaast cabnderf. CTA of the chest negative for pulmonary embolism.  reports that she quit smoking about 2 years ago. Her smoking use included Cigarettes. She has a 117.00 pack-year smoking history. She has never used smokeless tobacco.  Also acute resp hypoxemic failur due to above  Also rt chest pain due to above  DDx  - malignancy -breast v lung  - rheumatoid arthritis nodule with effusion  PLAN  0- await ctyology - if malignancy might needs VATS pleurodeiss - if non-diagnostic - needs CT guideed bx of nodule v VATS eval for pleural and nodule 0- ambulate on room air and test pulse ox   D/w Dr Charlies Silvers  Dr. Brand Males, M.D., [facility] Pulmonary and Critical Care Medicine Staff Physician [facility] Pager: [phone], If no answer or between   15:00h - 7:00h: call [phone]  09/30/2016 10:18 AM

## 2016-10-01 ENCOUNTER — Inpatient Hospital Stay (HOSPITAL_COMMUNITY): Payer: Medicare Other

## 2016-10-01 DIAGNOSIS — E876 Hypokalemia: Secondary | ICD-10-CM

## 2016-10-01 LAB — CBC
HCT: 35.6 % — ABNORMAL LOW (ref 36.0–46.0)
HEMOGLOBIN: 10.7 g/dL — AB (ref 12.0–15.0)
MCH: 27.8 pg (ref 26.0–34.0)
MCHC: 30.1 g/dL (ref 30.0–36.0)
MCV: 92.5 fL (ref 78.0–100.0)
PLATELETS: 434 10*3/uL — AB (ref 150–400)
RBC: 3.85 MIL/uL — AB (ref 3.87–5.11)
RDW: 16 % — ABNORMAL HIGH (ref 11.5–15.5)
WBC: 10.6 10*3/uL — ABNORMAL HIGH (ref 4.0–10.5)

## 2016-10-01 LAB — GLUCOSE, CAPILLARY: GLUCOSE-CAPILLARY: 120 mg/dL — AB (ref 65–99)

## 2016-10-01 NOTE — Progress Notes (Addendum)
Patient ID: [name], female   DOB: 04/15/1946, 70 y.o.   MRN: [id number]  PROGRESS NOTE    [name]  MIW:[id number] DOB: 05/27/1946 DOA: 09/24/2016  PCP: Merrilee Seashore, MD   Brief Narrative:  70 y.o. female with past medical history significant for hypertension, COPD not on home oxygen, history of right breast malignancy, status post lumpectomy and radiation therapy about 30 years ago, ex-smoker, recently diagnosed with rheumatoid arthritis (on weekly methotrexate and daily prednisone). She presented to ED with right-sided chest pain for 2-3 weeks PTA. Pain is sharp, on her right chest and radiates to back. Her symptoms gradually worsened. Shortness of breath also worsened in past 24 hours PTA.  In ED, patient was hypoxic with O2 sats in 86% range on room air. Chest x-ray was consistent with right-sided pleural effusion. Treated with ceftriaxone and azithromycin empirically. Had thoracentesis 11/22 with cytology results still pending.  Assessment & Plan:   Acute respiratory failure with hypoxia in the setting of new onset right-sided pleural effusion / Pulmonary nodule  - CT angio chest on admission showed no PE but there was moderate right-sided pleural effusion, with partial consolidation at the right lung base, could reflect pneumonia. Apparent 1.7 x 1.0 cm pleural-based nodule anteriorly at th right middle lobe. Malignancy cannot be excluded. PET/CT would be helpful for further evaluation.  - Per pulm - if malignancy then pt might need VATS; if non diagnostic pt will need CT guided bx of the nodule mentioned above  - Thoracentesis 11/22 - 1.5 L fluid removed and sent for analysis - exudative effusion, per PCCM appropriate to cover for parapneumonic effusion until cytology results are available. Cytology pending as of 11/28 (called path lab at 0452) - CXR 11/22 showed right pleural effusion has resolved status post thoracentesis. No pneumothorax noted. - Repeat chest x-ray 11/25  showed obscuration of the right hemidiaphragm with some blunting of the right lateral costophrenic angles which could reflect recurrent right pleural effusion with passive atelectasis versus right basilar pneumonia - Spoke with pulmonary this am who recommended a repeat thoracentesis and analyzing for cytology again. Appreciate interventional radiology help with thoracentesis  Right lower lobe pneumonia, unspecified organism / Leukocytosis  - Continue empiric azithromycin and rocephin  - Blood cx negative so far   Incidental thyroid nodule, left lobe - 1.7 cm hypoattenuating nodule at the left thyroid lobe. - TSH 0.28  - Patient clinically does not look hyperthyroid but today she does have tachycardia - Free T3 and T4 are WNL so this could be more of a euthyroid  - Thyroid US showed solid and cystic nodules in the thyroid. Largest nodule measures 1.6 cm in the superior right lobe. This nodule meets criteria for 1 year follow up - Started low dose metoprolol which has been controlling heart rate pretty nicely, her heart rate is morning is 93  Hypokalemia - Due to nebulizer treatment - Supplemented  Essential hypertension - Continue Hctz and Avapro  History of COPD - Continue current bronchodilators   Recently diagnosed rheumatoid arthritis - Continue low-dose steroid   DVT prophylaxis: SCD's bilaterally  Code Status: full code  Family Communication: no family at the bedside this am Disposition Plan: home once cleared by pulm    Consultants:   Pulmonary   IR   Procedures:   Thoracentesis 09/25/2016   Antimicrobials:   Azithromycin and rocephin 09/24/2016 -->    Subjective: No overnight events.  Objective: Vitals:   10/01/16 0521 10/01/16 0835 10/01/16 1007 10/01/16 1116  BP: (!) 107/46 140/60    Pulse: 76     Resp: 18     Temp: (!) 96.6 F (35.9 C)     TempSrc: Oral     SpO2: 97%  98% (!) 88%  Weight:      Height:        Intake/Output Summary (Last  24 hours) at 10/01/16 1119 Last data filed at 10/01/16 0819  Gross per 24 hour  Intake              240 ml  Output                0 ml  Net              240 ml   Filed Weights   09/24/16 1637  Weight: 109.6 kg (241 lb 10 oz)    Examination:  General exam: No acute distress, calm and comfortable  Respiratory system: Diminished breath sounds on right side, no wheezing  Cardiovascular system: S1 & S2 heard, RRR  Gastrointestinal system: (+) BS, abdomen nontender, no distention  Central nervous system: Nonfocal  Extremities: No swelling, palpable pulses bilaterally  Skin: warm and dry  Psychiatry: No agitation, no restlessness, normal mood and behavior   Data Reviewed: I have personally reviewed following labs and imaging studies  CBC:  Recent Labs Lab 09/24/16 1305 09/25/16 0353 09/26/16 0338 09/27/16 0322 09/30/16 0449 10/01/16 0503  WBC 12.6* 11.9* 11.1* 11.2* 11.1* 10.6*  NEUTROABS 10.8*  --   --   --   --   --   HGB 12.3 11.0* 10.4* 10.3* 10.4* 10.7*  HCT 39.0 36.9 34.2* 33.7* 34.5* 35.6*  MCV 90.5 93.4 91.7 91.1 92.5 92.5  PLT 367 401* 361 362 433* 761*   Basic Metabolic Panel:  Recent Labs Lab 09/25/16 0353 09/26/16 0338 09/27/16 0322 09/28/16 0546 09/30/16 0449  NA 142 141 139 141 140  K 3.2* 3.0* 2.9* 3.5 3.2*  CL 103 103 105 105 104  CO2 32 '31 27 28 31  '$ GLUCOSE 120* 116* 102* 113* 111*  BUN '10 15 11 9 12  '$ CREATININE 0.68 0.68 0.58 0.65 0.71  CALCIUM 8.7* 8.4* 8.4* 8.6* 8.6*  MG  --   --   --  2.0  --    GFR: Estimated Creatinine Clearance: 79.7 mL/min (by C-G formula based on SCr of 0.71 mg/dL). Liver Function Tests: No results for input(s): AST, ALT, ALKPHOS, BILITOT, PROT, ALBUMIN in the last 168 hours. No results for input(s): LIPASE, AMYLASE in the last 168 hours. No results for input(s): AMMONIA in the last 168 hours. Coagulation Profile:  Recent Labs Lab 09/25/16 0353  INR 0.97   Cardiac Enzymes: No results for input(s): CKTOTAL,  CKMB, CKMBINDEX, TROPONINI in the last 168 hours. BNP (last 3 results) No results for input(s): PROBNP in the last 8760 hours. HbA1C: No results for input(s): HGBA1C in the last 72 hours. CBG:  Recent Labs Lab 09/27/16 0808 09/27/16 1222 09/28/16 0816 09/29/16 0429 10/01/16 0829  GLUCAP 104* 159* 134* 113* 120*   Lipid Profile: No results for input(s): CHOL, HDL, LDLCALC, TRIG, CHOLHDL, LDLDIRECT in the last 72 hours. Thyroid Function Tests: No results for input(s): TSH, T4TOTAL, FREET4, T3FREE, THYROIDAB in the last 72 hours. Anemia Panel: No results for input(s): VITAMINB12, FOLATE, FERRITIN, TIBC, IRON, RETICCTPCT in the last 72 hours. Urine analysis:    Component Value Date/Time   COLORURINE YELLOW 04/30/2011 1340   APPEARANCEUR CLEAR 04/30/2011 1340   LABSPEC  1.014 04/30/2011 1340   PHURINE 6.5 04/30/2011 1340   GLUCOSEU NEGATIVE 04/30/2011 [place] 04/30/2011 [place] 04/30/2011 1340   [place] 04/30/2011 1340   PROTEINUR NEGATIVE 04/30/2011 1340   UROBILINOGEN 0.2 04/30/2011 1340   NITRITE NEGATIVE 04/30/2011 1340   LEUKOCYTESUR NEGATIVE 04/30/2011 1340   Sepsis Labs: '@LABRCNTIP'$ (procalcitonin:4,lacticidven:4)    Recent Results (from the past 240 hour(s))  Culture, blood (routine x 2)     Status: None   Collection Time: 09/24/16  1:44 PM  Result Value Ref Range Status   Specimen Description BLOOD RIGHT ANTECUBITAL  Final   Special Requests BOTTLES DRAWN AEROBIC AND ANAEROBIC 5CC  Final   Culture   Final    NO GROWTH 5 DAYS Performed at [facility]    Report Status 09/29/2016 FINAL  Final  Culture, blood (routine x 2)     Status: None   Collection Time: 09/24/16  1:58 PM  Result Value Ref Range Status   Specimen Description BLOOD LEFT ANTECUBITAL  Final   Special Requests BOTTLES DRAWN AEROBIC AND ANAEROBIC 5CC  Final   Culture   Final    NO GROWTH 5 DAYS Performed at [facility]    Report  Status 09/29/2016 FINAL  Final  Culture, body fluid-bottle     Status: None   Collection Time: 09/25/16  3:48 PM  Result Value Ref Range Status   Specimen Description FLUID PLEURAL RIGHT  Final   Special Requests BOTTLES DRAWN AEROBIC AND ANAEROBIC 10CC  Final   Culture   Final    NO GROWTH 5 DAYS Performed at [facility]    Report Status 09/30/2016 FINAL  Final  Gram stain     Status: None   Collection Time: 09/25/16  3:48 PM  Result Value Ref Range Status   Specimen Description FLUID PLEURAL RIGHT  Final   Special Requests NONE  Final   Gram Stain   Final    WBC PRESENT, PREDOMINANTLY MONONUCLEAR NO ORGANISMS SEEN CYTOSPIN Performed at [facility]    Report Status 09/25/2016 FINAL  Final      Radiology Studies: Dg Chest 1 View Result Date: 09/25/2016 Aortic atherosclerosis. Right pleural effusion has resolved status post thoracentesis. No pneumothorax is noted. Electronically Signed   By: Marijo Conception, M.D.   On: 09/25/2016 16:44   US Thoracentesis Asp Pleural Space W/img Guide Result Date: 09/25/2016 Successful ultrasound guided diagnostic and therapeutic right thoracentesis yielding 1.5 liters of pleural fluid. Read by: Rowe Robert, PA-C Electronically Signed   By: Marybelle Killings M.D.   On: 09/25/2016 16:15   Dg Chest 2 View Result Date: 09/24/2016 Right pleural effusion with right base atelectasis. Lungs elsewhere clear. There is aortic atherosclerosis.   Ct Angio Chest Pe W Or Wo Contrast Result Date: 09/25/2016 1. No evidence of significant pulmonary embolus. 2. Moderate right-sided pleural effusion, with partial consolidation at the right lung base. This could reflect pneumonia. 3. Apparent 1.7 x 1.0 cm pleural-based nodule anteriorly at the right middle lobe. Malignancy cannot be excluded. PET/CT would be helpful for further evaluation. Alternatively, diagnostic thoracentesis might be considered. 4. **An incidental finding of potential clinical  significance has been found. 1.7 cm hypoattenuating nodule at the left thyroid lobe. Consider further evaluation with thyroid ultrasound. If patient is clinically hyperthyroid, consider nuclear medicine thyroid uptake and scan.** 5. Scattered coronary artery calcifications seen. 6. 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   Dg Chest [facility]  1 View 1. Obscuration of the right hemidiaphragm with some blunting of the right lateral costophrenic angle. This could reflect recurrent right pleural effusion with passive atelectasis although right basilar pneumonia is not readily excluded. 2. Atherosclerotic aortic arch. Electronically Signed   By: Van Clines M.D.   On: 09/28/2016 10:26      Scheduled Meds: . atorvastatin  40 mg Oral q1800  . azithromycin  500 mg Oral Daily  . cefTRIAXone (ROCEPHIN)  IV  1 g Intravenous Q24H  . folic acid  1 mg Oral Daily  . irbesartan  300 mg Oral Daily   And  . hydrochlorothiazide  12.5 mg Oral Daily  . ipratropium-albuterol  3 mL Nebulization BID  . metoprolol tartrate  25 mg Oral BID  . mometasone-formoterol  2 puff Inhalation BID  . predniSONE  10 mg Oral Q breakfast  . pregabalin  200 mg Oral QHS  . rOPINIRole  0.25 mg Oral QHS  . sodium chloride flush  3 mL Intravenous Q12H   Continuous Infusions:   LOS: 7 days    Time spent: 25 minutes  Greater than 50% of the time spent on counseling and coordinating the care.   Leisa Lenz, MD [facility] Pager [phone]  If 7PM-7AM, please contact night-coverage [email or web address] Password [email or web address] 10/01/2016, 11:19 AM

## 2016-10-01 NOTE — Progress Notes (Signed)
Physical Therapy Treatment Patient Details Name: [name] MRN: [id number] DOB: 10-04-1946 Today's Date: 10/01/2016    History of Present Illness 70 y.o. admitted with pleural effusion (? malignant). s/p thoracentesis. PMH of Breast cancer, RA, HTN, COPD.     PT Comments    Pt tolerated increased gait distance of 165'. SaO2 88-92% on 3L O2 with ambulation, HR 99. Gait is steady with no loss of balance. Distance limited by fatigue, pain, 2/4 dyspnea.   Follow Up Recommendations  Home health PT     Equipment Recommendations  Other (comment) (oxygen, rollator (4 wheeled RW with seat) for energy conservation)    Recommendations for Other Services       Precautions / Restrictions Precautions Precautions: Other (comment) Precaution Comments: monitor O2, pt denies h/o falls in past 1 year Restrictions Weight Bearing Restrictions: No    Mobility  Bed Mobility Overal bed mobility: Modified Independent             General bed mobility comments: HOB up 35*  Transfers Overall transfer level: Modified independent Equipment used: Rolling walker (2 wheeled)   Sit to Stand: Modified independent (Device/Increase time)            Ambulation/Gait Ambulation/Gait assistance: Supervision Ambulation Distance (Feet): 165 Feet Assistive device: Rolling walker (2 wheeled) Gait Pattern/deviations: Step-through pattern;Trunk flexed     General Gait Details: trunk flexed with walking (pt reports this is due to back problems), SaO2 88-92% on 3L O2 [place] walking, HR 99, no LOB, 2/4 dyspnea, VCs for pursed lip breathing as pt tends to mouth breathe   Stairs            Wheelchair Mobility    Modified Rankin (Stroke Patients Only)       Balance Overall balance assessment: Modified Independent                                  Cognition Arousal/Alertness: Awake/alert Behavior During Therapy: WFL for tasks assessed/performed Overall Cognitive Status: Within  Functional Limits for tasks assessed                      Exercises      General Comments        Pertinent Vitals/Pain Pain Assessment: 0-10 Pain Score: 9  Pain Location: R chest with walking Pain Descriptors / Indicators: Aching Pain Intervention(s): Limited activity within patient's tolerance;Monitored during session;Premedicated before session    Home Living                      Prior Function            PT Goals (current goals can now be found in the care plan section) Acute Rehab PT Goals Patient Stated Goal: return to independence PT Goal Formulation: With patient Time For Goal Achievement: 10/10/16 Potential to Achieve Goals: Good Progress towards PT goals: Progressing toward goals    Frequency    Min 3X/week      PT Plan Current plan remains appropriate    Co-evaluation             End of Session Equipment Utilized During Treatment: Gait belt;Oxygen Activity Tolerance: Patient limited by fatigue Patient left: with call bell/phone within reach;in chair     Time: 1093-2355 PT Time Calculation (min) (ACUTE ONLY): 13 min  Charges:  $Gait Training: 8-22 mins  G Codes:      Blondell Reveal Kistler 10/01/2016, 11:14 AM [phone]

## 2016-10-01 NOTE — Procedures (Signed)
Ultrasound-guided diagnostic and therapeutic right thoracentesis performed yielding 1.2 liters of serosanguineous colored fluid. No immediate complications. Follow-up chest x-ray pending.      [name] E 2:09 PM 10/01/2016

## 2016-10-02 DIAGNOSIS — J189 Pneumonia, unspecified organism: Secondary | ICD-10-CM | POA: Diagnosis present

## 2016-10-02 DIAGNOSIS — I10 Essential (primary) hypertension: Secondary | ICD-10-CM | POA: Diagnosis present

## 2016-10-02 DIAGNOSIS — C3491 Malignant neoplasm of unspecified part of right bronchus or lung: Secondary | ICD-10-CM | POA: Diagnosis present

## 2016-10-02 DIAGNOSIS — R079 Chest pain, unspecified: Secondary | ICD-10-CM | POA: Diagnosis present

## 2016-10-02 DIAGNOSIS — C3490 Malignant neoplasm of unspecified part of unspecified bronchus or lung: Secondary | ICD-10-CM

## 2016-10-02 DIAGNOSIS — J181 Lobar pneumonia, unspecified organism: Secondary | ICD-10-CM

## 2016-10-02 DIAGNOSIS — J9621 Acute and chronic respiratory failure with hypoxia: Secondary | ICD-10-CM | POA: Diagnosis present

## 2016-10-02 DIAGNOSIS — E041 Nontoxic single thyroid nodule: Secondary | ICD-10-CM | POA: Diagnosis present

## 2016-10-02 DIAGNOSIS — E876 Hypokalemia: Secondary | ICD-10-CM | POA: Diagnosis present

## 2016-10-02 DIAGNOSIS — J90 Pleural effusion, not elsewhere classified: Secondary | ICD-10-CM | POA: Diagnosis present

## 2016-10-02 DIAGNOSIS — D72829 Elevated white blood cell count, unspecified: Secondary | ICD-10-CM | POA: Diagnosis present

## 2016-10-02 LAB — CBC
HEMATOCRIT: 35.4 % — AB (ref 36.0–46.0)
Hemoglobin: 10.7 g/dL — ABNORMAL LOW (ref 12.0–15.0)
MCH: 27.9 pg (ref 26.0–34.0)
MCHC: 30.2 g/dL (ref 30.0–36.0)
MCV: 92.2 fL (ref 78.0–100.0)
Platelets: 449 10*3/uL — ABNORMAL HIGH (ref 150–400)
RBC: 3.84 MIL/uL — ABNORMAL LOW (ref 3.87–5.11)
RDW: 16 % — AB (ref 11.5–15.5)
WBC: 10.4 10*3/uL (ref 4.0–10.5)

## 2016-10-02 LAB — BASIC METABOLIC PANEL
ANION GAP: 7 (ref 5–15)
BUN: 12 mg/dL (ref 6–20)
CALCIUM: 8.6 mg/dL — AB (ref 8.9–10.3)
CO2: 32 mmol/L (ref 22–32)
CREATININE: 0.73 mg/dL (ref 0.44–1.00)
Chloride: 102 mmol/L (ref 101–111)
Glucose, Bld: 107 mg/dL — ABNORMAL HIGH (ref 65–99)
Potassium: 3.2 mmol/L — ABNORMAL LOW (ref 3.5–5.1)
SODIUM: 141 mmol/L (ref 135–145)

## 2016-10-02 LAB — GLUCOSE, CAPILLARY: GLUCOSE-CAPILLARY: 115 mg/dL — AB (ref 65–99)

## 2016-10-02 MED ORDER — BUDESONIDE 0.25 MG/2ML IN SUSP
0.2500 mg | Freq: Two times a day (BID) | RESPIRATORY_TRACT | Status: DC
  Administered 2016-10-02 – 2016-10-08 (×12): 0.25 mg via RESPIRATORY_TRACT
  Filled 2016-10-02 (×12): qty 2

## 2016-10-02 MED ORDER — IPRATROPIUM-ALBUTEROL 0.5-2.5 (3) MG/3ML IN SOLN
3.0000 mL | Freq: Four times a day (QID) | RESPIRATORY_TRACT | Status: DC
  Administered 2016-10-02 – 2016-10-03 (×4): 3 mL via RESPIRATORY_TRACT
  Filled 2016-10-02 (×4): qty 3

## 2016-10-02 NOTE — Consult Note (Signed)
[place] [phone]   [phone]  CONSULT NOTE  REFERRING PHYSICIAN: Dr. Brand Males  REASON FOR CONSULTATION:  70 years old white female recently diagnosed with lung cancer.  HPI [name] is a 70 y.o. female was past medical history significant for right breast cancer status post lumpectomy followed by radiotherapy. The patient did not receive any adjuvant systemic chemotherapy or hormonal therapy. She also has a long history of smoking and COPD, rheumatoid arthritis and hypertension. The patient mentions that over the last 3 weeks she has been complaining of pain on the right side of the chest that was getting progressively worse. She also started having shortness of breath and presented to the emergency department on 09/24/2016 for further evaluation of her condition. Chest x-ray was performed and it showed right pleural effusion with right base atelectasis. This was followed by CT angiogram of the chest on 09/25/2016 and it showed no evidence for significant pulmonary embolus but there was moderate right-sided pleural effusion with partial consolidation at the right lung base. There was also apparent 1.7 x 1.0 cm pleural-based nodule anteriorly at the right middle lobe and malignancy could not be excluded. 09/25/2016 the patient underwent ultrasound-guided thoracentesis by interventional radiology with drainage of 1.5 L of hazy, amber fluid.  The final cytology ([id number]) was consistent with adenocarcinoma of lung primary. The neoplasm is stained positive for CK 7, TTF-1 and negative for ER, GCDFP and PAX8 consistent with adenocarcinoma of lung primary. The patient underwent another ultrasound guided right thoracentesis on 10/01/2016 with drainage of 1.2 L of pleural fluid. She sees a little bit better but continues to have mild right-sided chest pain. She denied having any fever or chills. She has cough but no hemoptysis. She was also found to  have suspicious thyroid nodules and the patient underwent ultrasound of the thyroid which showed solid and cystic nodules in the thyroid the largest nodule measured 1.6 cm in the superior right lobe and commutes the criteria for 1 year follow-up. I was consulted to see the patient today for evaluation and recommendation regarding treatment of her condition. She denied having any other significant complaints. Family history significant for a sister diagnosed with a small cell lung cancer and she is currently under my care. The patient is single and has no children. 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 She has a long history of smoking for around 60 years. She quit in 2016. She has no history of alcohol or drug abuse.  HPI  Past Medical History:  Diagnosis Date  . Cancer [facility])    right breast cancer  . COPD (chronic obstructive pulmonary disease) ([place])     Past Surgical History:  Procedure Laterality Date  . ABDOMINAL HYSTERECTOMY  1990  . BACK SURGERY    . BREAST SURGERY Right    lumpectomy w/ radiation    No family history on file.  Social History Social History  Substance Use Topics  . Smoking status: Former Smoker    Packs/day: 3.00    Years: 39.00    Types: Cigarettes    Quit date: 12/03/2013  . Smokeless tobacco: Never Used     Comment: Using e-cig frequently 04/15/14  . Alcohol use No    No Known Allergies  Current Facility-Administered Medications  Medication Dose Route Frequency Provider Last Rate Last Dose  . albuterol (PROVENTIL) (2.5 MG/3ML) 0.083% nebulizer solution 2.5 mg  2.5  mg Inhalation Q4H PRN Dron Tanna Furry, MD      . atorvastatin (LIPITOR) tablet 40 mg  40 mg Oral q1800 Dron Tanna Furry, MD   40 mg at 10/01/16 1832  . budesonide (PULMICORT) nebulizer solution 0.25 mg  0.25 mg Nebulization BID Brand Males, MD      . folic acid (FOLVITE) tablet 1 mg  1 mg Oral Daily Dron Tanna Furry, MD   1 mg at 10/02/16 1024  . irbesartan (AVAPRO) tablet 300 mg  300 mg Oral Daily Berton Mount, RPH   300 mg at 10/02/16 1024   And  . hydrochlorothiazide (MICROZIDE) capsule 12.5 mg  12.5 mg Oral Daily Berton Mount, RPH   12.5 mg at 10/02/16 1024  . HYDROmorphone (DILAUDID) injection 0.5 mg  0.5 mg Intravenous Q4H PRN Robbie Lis, MD   0.5 mg at 10/02/16 1404  . ipratropium-albuterol (DUONEB) 0.5-2.5 (3) MG/3ML nebulizer solution 3 mL  3 mL Nebulization Q6H Brand Males, MD   3 mL at 10/02/16 1442  . metoprolol tartrate (LOPRESSOR) tablet 25 mg  25 mg Oral BID Robbie Lis, MD   25 mg at 10/02/16 1024  . ondansetron (ZOFRAN) tablet 4 mg  4 mg Oral Q6H PRN Dron Tanna Furry, MD       Or  . ondansetron [facility]) injection 4 mg  4 mg Intravenous Q6H PRN Dron Tanna Furry, MD      . oxyCODONE-acetaminophen (PERCOCET/ROXICET) 5-325 MG per tablet 1 tablet  1 tablet Oral Q6H PRN Berton Mount, RPH   1 tablet at 10/02/16 1456   And  . oxyCODONE (Oxy IR/ROXICODONE) immediate release tablet 5 mg  5 mg Oral Q6H PRN Berton Mount, RPH   5 mg at 10/02/16 1456  . predniSONE (DELTASONE) tablet 10 mg  10 mg Oral Q breakfast Dron Tanna Furry, MD   10 mg at 10/02/16 0905  . pregabalin (LYRICA) capsule 200 mg  200 mg Oral QHS Dron Tanna Furry, MD   200 mg at 10/01/16 2253  . rOPINIRole (REQUIP) tablet 0.25 mg  0.25 mg Oral QHS Robbie Lis, MD   0.25 mg at 10/01/16 2253  . sodium chloride flush (NS) 0.9 % injection 3 mL  3 mL Intravenous Q12H Dron Tanna Furry, MD   3 mL at 10/02/16 0910    Review of Systems  Constitutional: positive for fatigue Eyes: negative Ears, nose, mouth, throat, and face: negative Respiratory: positive for cough, dyspnea on exertion and pleurisy/chest pain Cardiovascular: negative Gastrointestinal: negative Genitourinary:negative Integument/breast: negative Hematologic/lymphatic: negative Musculoskeletal:positive for muscle  weakness Neurological: negative Behavioral/Psych: negative Endocrine: negative Allergic/Immunologic: negative  Physical Exam  HKV:QQVZD, healthy, no distress, well nourished, well developed and anxious SKIN: skin color, texture, turgor are normal, no rashes or significant lesions HEAD: Normocephalic, No masses, lesions, tenderness or abnormalities EYES: normal, PERRLA, Conjunctiva are pink and non-injected EARS: External ears normal, Canals clear OROPHARYNX:no exudate, no erythema and lips, buccal mucosa, and tongue normal  NECK: supple, no adenopathy, no JVD LYMPH:  no palpable lymphadenopathy, no hepatosplenomegaly BREAST:not examined LUNGS: decreased breath sounds, expiratory wheezes bilaterally HEART: regular rate & rhythm, no murmurs and no gallops ABDOMEN:abdomen soft, non-tender, obese, normal bowel sounds and no masses or organomegaly BACK: Back symmetric, no curvature., No CVA tenderness EXTREMITIES:no joint deformities, effusion, or inflammation, no edema, no skin discoloration  NEURO: alert & oriented x 3 with fluent speech, no focal motor/sensory deficits  PERFORMANCE STATUS: ECOG 1  LABORATORY DATA: Lab  Results  Component Value Date   WBC 10.4 10/02/2016   HGB 10.7 (L) 10/02/2016   HCT 35.4 (L) 10/02/2016   MCV 92.2 10/02/2016   PLT 449 (H) 10/02/2016    '@LASTCHEM'$ @  RADIOGRAPHIC STUDIES: Dg Chest 1 View  Result Date: 10/01/2016 CLINICAL DATA:  Post right thoracentesis EXAM: CHEST 1 VIEW COMPARISON:  09/28/2016 FINDINGS: Decreasing right pleural effusion following thoracentesis. No pneumothorax. Small right effusion and right base atelectasis persists. Heart is borderline in size. Left lung is clear. IMPRESSION: No pneumothorax following right thoracentesis. Small residual right effusion with right base atelectasis. Electronically Signed   By: Rolm Baptise M.D.   On: 10/01/2016 14:33   Dg Chest 1 View  Result Date: 09/25/2016 CLINICAL DATA:  Status post  right thoracentesis. EXAM: CHEST 1 VIEW COMPARISON:  Radiographs of September 24, 2016. FINDINGS: Stable cardiomegaly. Atherosclerosis of thoracic aorta is noted. No pneumothorax is noted. Right pleural effusion appears to have resolved. No acute pulmonary disease is noted. Bony thorax is unremarkable. IMPRESSION: Aortic atherosclerosis. Right pleural effusion has resolved status post thoracentesis. No pneumothorax is noted. Electronically Signed   By: Marijo Conception, M.D.   On: 09/25/2016 16:44   Dg Chest 2 View  Result Date: 09/24/2016 CLINICAL DATA:  Shortness of breath and chest pain EXAM: CHEST  2 VIEW COMPARISON:  December 05, 2013 FINDINGS: There is a right pleural effusion with right base atelectasis. Lungs elsewhere are clear. Heart size and pulmonary vascularity are normal. No adenopathy. There is atherosclerotic calcification in the aorta. No bone lesions. IMPRESSION: Right pleural effusion with right base atelectasis. Lungs elsewhere clear. There is aortic atherosclerosis. Electronically Signed   By: Lowella Grip III M.D.   On: 09/24/2016 13:20   Ct Angio Chest Pe W Or Wo Contrast  Result Date: 09/25/2016 CLINICAL DATA:  Subacute onset of right-sided chest pain. Increasing respiratory distress. Leukocytosis and elevated D-dimer. Initial encounter. EXAM: CT ANGIOGRAPHY CHEST WITH CONTRAST TECHNIQUE: Multidetector CT imaging of the chest was performed using the standard protocol during bolus administration of intravenous contrast. Multiplanar CT image reconstructions and MIPs were obtained to evaluate the vascular anatomy. CONTRAST:  100 mL of Isovue 370 IV contrast COMPARISON:  None. FINDINGS: Cardiovascular: There is no evidence of significant pulmonary embolus. Evaluation for pulmonary embolus is suboptimal in areas of airspace consolidation and due to motion artifact. Scattered calcification is noted along the aortic arch and descending thoracic aorta. The heart remains borderline normal  in size. Scattered coronary artery calcifications are seen. The great vessels are unremarkable in appearance. Mediastinum/Nodes: Visualized mediastinal nodes remain normal in size. No pericardial effusion is identified. Bilateral hypoattenuating nodules are noted within the thyroid gland, measuring up to 1.7 cm on the left. No axillary lymphadenopathy is seen. The patient is status post right-sided mastectomy. Lungs/Pleura: A moderate right-sided pleural effusion is noted, with partial consolidation at the right lung base. There is a 1.7 x 1.0 cm pleural-based nodule anteriorly at the right middle lobe (image 49 of 95). Malignancy cannot be excluded. No pneumothorax is seen. Upper Abdomen: The visualized portions of the liver and spleen are grossly unremarkable. The visualized portions of the adrenal glands are within normal limits. Musculoskeletal: No acute osseous abnormalities are identified. An hemangioma is noted at vertebral body T9. The visualized musculature is unremarkable in appearance. Review of the MIP images confirms the above findings. IMPRESSION: 1. No evidence of significant pulmonary embolus. 2. Moderate right-sided pleural effusion, with partial consolidation at the right lung base. This  could reflect pneumonia. 3. Apparent 1.7 x 1.0 cm pleural-based nodule anteriorly at the right middle lobe. Malignancy cannot be excluded. PET/CT would be helpful for further evaluation. Alternatively, diagnostic thoracentesis might be considered. 4. **An incidental finding of potential clinical significance has been found. 1.7 cm hypoattenuating nodule at the left thyroid lobe. Consider further evaluation with thyroid ultrasound. If patient is clinically hyperthyroid, consider nuclear medicine thyroid uptake and scan.** 5. Scattered coronary artery calcifications seen. 6. 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 Electronically Signed   By: Garald Balding M.D.   On: 09/25/2016 00:58   Dg Chest Port 1 View  Result  Date: 09/28/2016 CLINICAL DATA:  Productive cough today EXAM: PORTABLE CHEST 1 VIEW COMPARISON:  09/25/2016 FINDINGS: There is new airspace opacity obscuring the right hemidiaphragm. Atherosclerotic calcification of the aortic arch. The left lung appears clear.  Upper normal heart size. IMPRESSION: 1. Obscuration of the right hemidiaphragm with some blunting of the right lateral costophrenic angle. This could reflect recurrent right pleural effusion with passive atelectasis although right basilar pneumonia is not readily excluded. 2. Atherosclerotic aortic arch. Electronically Signed   By: Van Clines M.D.   On: 09/28/2016 10:26   US Thyroid  Result Date: 09/27/2016 CLINICAL DATA:  Incidental on CT.  Thyroid nodule seen on CT. EXAM: THYROID ULTRASOUND TECHNIQUE: Ultrasound examination of the thyroid gland and adjacent soft tissues was performed. COMPARISON:  Chest CT 09/24/2016 FINDINGS: Parenchymal Echotexture: Mildly heterogenous Estimated total number of nodules >/= 1 cm: 3 Number of spongiform nodules >/=  2 cm not described below (TR1): 0 Number of mixed cystic and solid nodules >/= 1.5 cm not described below (TR2): 0 _________________________________________________________ Isthmus: Measures 0.4 cm in thickness. No discrete nodules are identified within the thyroid isthmus. _________________________________________________________ Right lobe: Measures 4.2 x 1.6 x 2.0 cm. Nodule # 1: Location: Isthmus; Superior Size: 1.6 x 1.3 x 1.4 cm. Composition: solid/almost completely solid (2), there is an eccentric cystic component. Echogenicity: isoechoic (1) Shape: not taller-than-wide (0) Margins: ill-defined (0) Echogenic foci: none (0) ACR TI-RADS total points: 3. ACR TI-RADS risk category: TR3 (3 points). ACR TI-RADS recommendations: *Given size (>/= 1.5 - 2.4 cm) and appearance, a follow-up ultrasound in 1 year should be considered based on TI-RADS criteria. Nodule # 2: Location: Right; Inferior Size:  0.6 x 0.4 x 0.5 cm Composition: solid/almost completely solid (2) Echogenicity: hypoechoic (2) Shape: not taller-than-wide (0) Margins: ill-defined (0) Echogenic foci: none (0) ACR TI-RADS total points: 4. ACR TI-RADS risk category: TR4 (4-6 points). ACR TI-RADS recommendations: Given size (<0.9 cm) and appearance, this nodule does NOT meet TI-RADS criteria for biopsy or dedicated follow-up. _________________________________________________________ Left lobe: Measures 4.1 x 1.6 x 1.9 cm. Nodule # 3: Location: Left; Superior Size: 1.0 x 1.0 x 1.0 cm. Composition: mixed cystic and solid (1) Echogenicity: hypoechoic (2) Shape: not taller-than-wide (0) Margins: smooth (0) Echogenic foci: none (0) ACR TI-RADS total points: 3. ACR TI-RADS risk category: TR3 (3 points). ACR TI-RADS recommendations: Given size (<1.4 cm) and appearance, this nodule does NOT meet TI-RADS criteria for biopsy or dedicated follow-up. There is a cystic structure in the superior left thyroid lobe measuring 1.0 x 0.7 x 0.7 cm. IMPRESSION: Solid and cystic nodules in the thyroid. Largest nodule measures 1.6 cm in the superior right lobe. This nodule meets criteria for 1 year follow-up. The above is in keeping with the ACR TI-RADS recommendations - J Am Coll Radiol 2017;14:587-595. Electronically Signed   By: Scherrie Gerlach.D.  On: 09/27/2016 07:59   US Thoracentesis Asp Pleural Space W/img Guide  Result Date: 10/01/2016 INDICATION: Recurrent right pleural effusion of unknown etiology. Request for diagnostic and therapeutic thoracentesis. EXAM: ULTRASOUND GUIDED DIAGNOSTIC AND THERAPEUTIC THORACENTESIS MEDICATIONS: 1% Lidocaine COMPLICATIONS: None immediate. PROCEDURE: An ultrasound guided thoracentesis was thoroughly discussed with the patient and questions answered. The benefits, risks, alternatives and complications were also discussed. The patient understands and wishes to proceed with the procedure. Written consent was obtained. Ultrasound  was performed to localize and mark an adequate pocket of fluid in the right chest. The area was then prepped and draped in the normal sterile fashion. 1% Lidocaine was used for local anesthesia. Under ultrasound guidance a Safe-T-Centesis catheter was introduced. Thoracentesis was performed. The catheter was removed and a dressing applied. FINDINGS: A total of approximately 1.2L of serosanguineous fluid was removed. Samples were sent to the laboratory as requested by the clinical team. IMPRESSION: Successful ultrasound guided right thoracentesis yielding 1.2L of pleural fluid. Read by: Saverio Danker, PA-C Electronically Signed   By: Lucrezia Europe M.D.   On: 10/01/2016 14:31   US Thoracentesis Asp Pleural Space W/img Guide  Result Date: 09/25/2016 INDICATION: Ex-smoker with remote history of breast cancer, COPD, rheumatoid arthritis, dyspnea, pneumonia, right pleural effusion. Request made for diagnostic and therapeutic right thoracentesis. EXAM: ULTRASOUND GUIDED DIAGNOSTIC AND THERAPEUTIC RIGHT THORACENTESIS MEDICATIONS: None. COMPLICATIONS: None immediate. PROCEDURE: An ultrasound guided thoracentesis was thoroughly discussed with the patient and questions answered. The benefits, risks, alternatives and complications were also discussed. The patient understands and wishes to proceed with the procedure. Written consent was obtained. Ultrasound was performed to localize and mark an adequate pocket of fluid in the right chest. The area was then prepped and draped in the normal sterile fashion. 1% Lidocaine was used for local anesthesia. Under ultrasound guidance a Safe-T-Centesis catheter was introduced. Thoracentesis was performed. The catheter was removed and a dressing applied. FINDINGS: A total of approximately 1.5 liters of hazy, amber fluid was removed. Samples were sent to the laboratory as requested by the clinical team. IMPRESSION: Successful ultrasound guided diagnostic and therapeutic right thoracentesis  yielding 1.5 liters of pleural fluid. Read by: Rowe Robert, PA-C Electronically Signed   By: Marybelle Killings M.D.   On: 09/25/2016 16:15    ASSESSMENT: This is a very pleasant 70 years old white female recently diagnosed with a stage IV (T1a, NX, M1 a) non-small cell lung cancer, adenocarcinoma presented with large right pleural effusion in addition to right middle lobe pulmonary nodule diagnosed in November 2017, pending further staging workup   PLAN: I had a lengthy discussion with the patient and her sister today about her current disease stage, prognosis and treatment options. I recommended for the patient to complete the staging workup by ordering a PET scan on outpatient basis after discharge. The patient will also need MRI of the brain to rule out brain metastasis. I will ask the pathology department to send the tissue block to [facility] for molecular studies as well as PDL 1 expression. For the recurrent right pleural effusion, the patient would benefit from Pleurx catheter placement by either cardiothoracic surgery or interventional radiology. I will arrange for the patient a follow-up appointment with me at the [facility] in around 2 weeks for more detailed discussion of her treatment options after completion of the staging workup and the molecular studies. If she becomes more symptomatic, I may consider starting systemic therapy sooner. She was advised to call if she has any  concerning symptoms in the interval. The patient voices understanding of current disease status and treatment options and is in agreement with the current care plan.  All questions were answered. The patient knows to call the clinic with any problems, questions or concerns. We can certainly see the patient much sooner if necessary.  Thank you so much for allowing me to participate in the care of [name]. I will continue to follow up the patient with you and assist in her care.  Disclaimer: This note was  dictated with voice recognition software. Similar sounding words can inadvertently be transcribed and may not be corrected upon review.   Izack Hoogland K. October 02, 2016, 5:39 PM

## 2016-10-02 NOTE — Progress Notes (Addendum)
Name: [name] MRN: [id number] DOB: February 18, 1946    ADMISSION DATE:  09/24/2016 CONSULTATION DATE:  10/02/2016  REFERRING MD :  Toma Copier  CHIEF COMPLAINT:  Chest pain  brief  70 year old ex-smoker 2015 PFT with gold stge 2 copd presented with right-sided sudden onset chest pain about 3 weeks ago. She was recently diagnosed with rheumatoid arthritis about 2 months ago and started on weekly methotrexate and daily prednisone with improvement in her hand stiffness. She was supposed to see her PCP Dr. Ashby Dawes today but developed increased respiratory distress and came into the emergency room. She was found to be hypoxic to 86% that improved with oxygen, chest x-ray showed a right-sided pleural effusion. She denies fevers or URI symptoms. Her chest pain is still present, nonpleuritic and nonradiating. She felt like she had broken her ribs. Her breathing suddenly got worse and she felt like she was not going to make it. She has been started on empiric ceftriaxone and azithromycin. She smoked about a pack per day for many years until she quit in 2015. She has a history of breast cancer 30 years ago to was treated with lumpectomy and radiation  NOTED: THIS IS NEW HYPOXEMIA for patient   has a past medical history of Cancer ([place]) and COPD (chronic obstructive pulmonary disease) ([place]).   has a past surgical history that includes Back surgery; Breast surgery (Right); and Abdominal hysterectomy (1990).   Events 11/222/17 . No evidence of significant pulmonary embolus. 2. Moderate right-sided pleural effusion, with partial consolidation at the right lung base. This could reflect pneumonia. 3. Apparent 1.7 x 1.0 cm pleural-based nodule anteriorly at the right middle lobe. Malignancy cannot be excluded 09/25/16 - A total of approximately 1.5 liters of hazy, amber fluid was removed. LDH 578 and exudate , 56% lymphs. NO GLUCOSE done 09/26/16  Less dyspnea, but some discomfort in chest with  deep breath since thoracentesis.  09/30/16: still on 3L:[place]. Says o 2 need is new in hospital. Was not at it at home. Since admit 09/24/2016 with LOS 8 =says despite rt thora overall not better interms of dyspnea but rt lateral infra-axillaly pain some better. Per Dr Charlies Silvers who spoke t opath - further anaylsis being done   SUBJECTIVE/OVERNIGHT/INTERVAL HX 10/02/16  - desats with exertin 2 days ago. Now on 3L [place]. 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 Yesterday had recurrence of effusion and s/p 1.2L thora again  VITAL SIGNS: Temp:  [98.2 F (36.8 C)-98.8 F (37.1 C)] 98.2 F (36.8 C) (11/29 8182) Pulse Rate:  [80-90] 83 (11/29 0623) Resp:  [17-18] 18 (11/28 2139) BP: (101-140)/(49-73) 108/49 (11/29 0623) SpO2:  [88 %-98 %] 94 % (11/29 0740)  PHYSICAL EXAMINATION: General: Obese female. No distress.  Sitting comfortabley ion 3L O2.  HEENT:  Moist membranes. No scleral icterus or injection. No stridor  Pulmonary:  CTA bilaterally but Rt base more dimnished. Cardiovascular: Regular rate & rhythm. No edema. Abdomen: Soft. Protuberant. Normal bowel sounds. Neurological:   Moving all 4 extremities. Oriented, non-focal 'MSK CLUBBING +    PULMONARY No results for input(s): PHART, PCO2ART, PO2ART, HCO3, TCO2, O2SAT in the last 168 hours.  Invalid input(s): PCO2, PO2  CBC  Recent Labs Lab 09/30/16 0449 10/01/16 0503 10/02/16 0512  HGB 10.4* 10.7* 10.7*  HCT 34.5* 35.6* 35.4*  WBC 11.1* 10.6* 10.4  PLT 433* 434* 449*    COAGULATION No results for input(s): INR in the last 168 hours.  CARDIAC  No results for input(s): TROPONINI in  the last 168 hours. No results for input(s): PROBNP in the last 168 hours.   CHEMISTRY  Recent Labs Lab 09/26/16 0338 09/27/16 0322 09/28/16 0546 09/30/16 0449 10/02/16 0512  NA 141 139 141 140 141  K 3.0* 2.9* 3.5 3.2* 3.2*  CL 103 105 105 104 102  CO2 '31 27 28 31 '$ 32  GLUCOSE 116* 102* 113* 111* 107*  BUN '15 11 9 12 12    '$ CREATININE 0.68 0.58 0.65 0.71 0.73  CALCIUM 8.4* 8.4* 8.6* 8.6* 8.6*  MG  --   --  2.0  --   --    Estimated Creatinine Clearance: 79.7 mL/min (by C-G formula based on SCr of 0.73 mg/dL).   LIVER No results for input(s): AST, ALT, ALKPHOS, BILITOT, PROT, ALBUMIN, INR in the last 168 hours.   INFECTIOUS No results for input(s): LATICACIDVEN, PROCALCITON in the last 168 hours.   ENDOCRINE CBG (last 3)   Recent Labs  10/01/16 0829 10/02/16 0811  GLUCAP 120* 115*         IMAGING x48h  - image(s) personally visualized  -   highlighted in bold Dg Chest 1 View  Result Date: 10/01/2016 CLINICAL DATA:  Post right thoracentesis EXAM: CHEST 1 VIEW COMPARISON:  09/28/2016 FINDINGS: Decreasing right pleural effusion following thoracentesis. No pneumothorax. Small right effusion and right base atelectasis persists. Heart is borderline in size. Left lung is clear. IMPRESSION: No pneumothorax following right thoracentesis. Small residual right effusion with right base atelectasis. Electronically Signed   By: Rolm Baptise M.D.   On: 10/01/2016 14:33   US Thoracentesis Asp Pleural Space W/img Guide  Result Date: 10/01/2016 INDICATION: Recurrent right pleural effusion of unknown etiology. Request for diagnostic and therapeutic thoracentesis. EXAM: ULTRASOUND GUIDED DIAGNOSTIC AND THERAPEUTIC THORACENTESIS MEDICATIONS: 1% Lidocaine COMPLICATIONS: None immediate. PROCEDURE: An ultrasound guided thoracentesis was thoroughly discussed with the patient and questions answered. The benefits, risks, alternatives and complications were also discussed. The patient understands and wishes to proceed with the procedure. Written consent was obtained. Ultrasound was performed to localize and mark an adequate pocket of fluid in the right chest. The area was then prepped and draped in the normal sterile fashion. 1% Lidocaine was used for local anesthesia. Under ultrasound guidance a Safe-T-Centesis catheter  was introduced. Thoracentesis was performed. The catheter was removed and a dressing applied. FINDINGS: A total of approximately 1.2L of serosanguineous fluid was removed. Samples were sent to the laboratory as requested by the clinical team. IMPRESSION: Successful ultrasound guided right thoracentesis yielding 1.2L of pleural fluid. Read by: Saverio Danker, PA-C Electronically Signed   By: Lucrezia Europe M.D.   On: 10/01/2016 14:31      ASSESSMENT / PLAN:  70 y.o. female with right pleural effusion and history of Rheumatoid Arthritis and remote breaast cabnderf. CTA of the chest negative for pulmonary embolism.  reports that she quit smoking about 2 years ago. Her smoking use included Cigarettes. She has a 117.00 pack-year smoking history. She has never used smokeless tobacco. Has gold stage 2 copd on 2015 PFT  New acuteon chronnic  resp hypoxemic failur due to above  Malignancy -Lung Adenocarcinoma - NEW dx.   Also rt chest pain due to above - no complaints about this on 10/02/2016   -   PLAN - lung cancer - advanced - news given to Dionicio Stall. And Dr Julien Nordmann called for consult = pulse ox for o2 > 88% =-duoneb q6h + start pulmicort neb q12 (dcdulera) + alb prn  -opioids  for pain mgmt  - regarding effusion: if and when it recurs - needs IR guided pleurX - currently effusion free but at high risk for recurrence - dc antibiotics  Future Appointments Date Time Provider [place]  10/18/2016 9:00 AM Tanda Rockers, MD [facility] None    PCCM will sign off  D/w Dr Laverle Patter of tria dand Dr Julien Nordmann of oncology      Dr. Brand Males, M.D., [facility].C.P Pulmonary and Critical Care Medicine Staff Physician [facility] Pager: [phone], If no answer or between  15:00h - 7:00h: call [phone]  10/02/2016 9:29 AM

## 2016-10-02 NOTE — Progress Notes (Signed)
I took over care for this patient at 0930, I agree with the previous nurses assessment.

## 2016-10-02 NOTE — Progress Notes (Signed)
PROGRESS NOTE                                                                                                                                                                                                             Patient Demographics:    Karlisa Gaubert, is a 70 y.o. female, DOB - 1946-10-05, IWL:[id number]  Admit date - 09/24/2016   Admitting Physician Dron Tanna Furry, MD  Outpatient Primary MD for the patient is [facility], MD  LOS - 8  Outpatient Specialists: None  Chief Complaint  Patient presents with  . 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   Subjective:   Patient had repeat thoracentesis done yesterday. Breathing better. Discussed cytology finding.   Assessment  & Plan :    Principal Problem:   Acute on chronic respiratory failure with hypoxia (HCC) -Secondary to malignant pleural effusion. Underwent thoracentesis. Currently stable. 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 -Discussed with Dr. Julien Nordmann who recommended pleurx catheter placement given her recurrent effusion. Recommends fluid to be sent for molecular study. Will arrange PET scan as outpatient. Spoke with IR. Since she had the thoracentesis only yesterday it's unlikely they will be filled up right away. (The one prior to yesterday was 5 days ago). He doesn't seem likely it would be filled up in the next 48 hours. -Recommend outpatient follow-up in one week. Appreciate pulmonary  recommendations.  Active Problems:    COPD GOLD II  Will assess for home O2 needs. Currently asymptomatic.  Incidental left lower thyroid nodule TSH of 0.28. Normal free T3 and T4. Her ultrasound showing solid and cystic nodules . needs annual follow-up.  Right lower lobe pneumonia Possibly postobstructive. Completed antibiotics.  Hypokalemia Replenished  Essential hypertension   continue home medications      Code Status : Full code  Family Communication  : none at bedside  Disposition Plan  : home in am if stable  Barriers For Discharge :  Improving symptoms  Consults  :   Pulmonary Oncology IR  Procedures  :  Right thoracentesis  DVT Prophylaxis  :   Lab Results  Component Value Date   PLT 449 (H) 10/02/2016    Antibiotics  :    Anti-infectives    Start     Dose/Rate Route Frequency Ordered Stop   09/25/16 1000  cefTRIAXone (ROCEPHIN) 1 g in dextrose 5 % 50 mL IVPB  Status:  Discontinued     1 g 100 mL/hr over 30 Minutes Intravenous Every 24 hours 09/24/16 1650 10/02/16 0939   09/25/16 1000  azithromycin (ZITHROMAX) tablet 500 mg  Status:  Discontinued     500 mg Oral Daily 09/24/16 1650 10/02/16 0939   09/24/16 1345  cefTRIAXone (ROCEPHIN) 1 g in dextrose 5 % 50 mL IVPB     1 g 100 mL/hr over 30 Minutes Intravenous  Once 09/24/16 1335 09/24/16 1500   09/24/16 1345  azithromycin (ZITHROMAX) 500 mg in dextrose 5 % 250 mL IVPB     500 mg 250 mL/hr over 60 Minutes Intravenous  Once 09/24/16 1335 09/24/16 1609        Objective:   Vitals:   10/01/16 2139 10/02/16 0623 10/02/16 0738 10/02/16 0740  BP: (!) 112/56 (!) 108/49    Pulse: 89 83    Resp: 18     Temp: 98.8 F (37.1 C) 98.2 F (36.8 C)    TempSrc: Oral Oral    SpO2: 95% 94% 94% 94%  Weight:      Height:        Wt Readings from Last 3 Encounters:  09/24/16 109.6 kg (241 lb 10 oz)  03/09/15 121.6 kg (268 lb)  08/16/14 121.1 kg (267 lb)     Intake/Output Summary (Last 24 hours) at  10/02/16 1634 Last data filed at 10/01/16 2200  Gross per 24 hour  Intake              340 ml  Output                0 ml  Net              340 ml     Physical Exam  Gen: not in distress HEENT:  moist mucosa, supple neck Chest: Diminished breath sounds over right lung CVS: N S1&S2, no murmurs,  GI: soft, NT, ND, Musculoskeletal: warm, no edema     Data Review:    CBC  Recent Labs Lab 09/26/16 0338 09/27/16 0322 09/30/16 0449 10/01/16 0503 10/02/16 0512  WBC 11.1* 11.2* 11.1* 10.6* 10.4  HGB 10.4* 10.3* 10.4* 10.7* 10.7*  HCT 34.2* 33.7* 34.5* 35.6* 35.4*  PLT 361 362 433* 434* 449*  MCV 91.7 91.1 92.5 92.5 92.2  MCH 27.9 27.8 27.9 27.8 27.9  MCHC 30.4 30.6 30.1 30.1 30.2  RDW 16.6* 16.4* 16.2* 16.0* 16.0*    Chemistries   Recent Labs Lab 09/26/16 0338 09/27/16 0322 09/28/16 0546 09/30/16 0449 10/02/16 0512  NA 141 139 141 140 141  K 3.0* 2.9* 3.5 3.2* 3.2*  CL 103 105 105 104 102  CO2 '31 27 28 31 '$ 32  GLUCOSE 116* 102* 113* 111* 107*  BUN '15 11 9 12 12  '$ CREATININE 0.68 0.58 0.65 0.71 0.73  CALCIUM 8.4* 8.4* 8.6* 8.6* 8.6*  MG  --   --  2.0  --   --    ------------------------------------------------------------------------------------------------------------------ No results for input(s): CHOL, HDL, LDLCALC, TRIG, CHOLHDL, LDLDIRECT in the last  72 hours.  Lab Results  Component Value Date   HGBA1C 6.4 (H) 12/03/2013   ------------------------------------------------------------------------------------------------------------------ No results for input(s): TSH, T4TOTAL, T3FREE, THYROIDAB in the last 72 hours.  Invalid input(s): FREET3 ------------------------------------------------------------------------------------------------------------------ No results for input(s): VITAMINB12, FOLATE, FERRITIN, TIBC, IRON, RETICCTPCT in the last 72 hours.  Coagulation profile No results for input(s): INR, PROTIME in the last 168 hours.  No results for  input(s): DDIMER in the last 72 hours.  Cardiac Enzymes No results for input(s): CKMB, TROPONINI, MYOGLOBIN in the last 168 hours.  Invalid input(s): CK ------------------------------------------------------------------------------------------------------------------ No results found for: BNP  Inpatient Medications  Scheduled Meds: . atorvastatin  40 mg Oral q1800  . budesonide  0.25 mg Nebulization BID  . folic acid  1 mg Oral Daily  . irbesartan  300 mg Oral Daily   And  . hydrochlorothiazide  12.5 mg Oral Daily  . ipratropium-albuterol  3 mL Nebulization Q6H  . metoprolol tartrate  25 mg Oral BID  . predniSONE  10 mg Oral Q breakfast  . pregabalin  200 mg Oral QHS  . rOPINIRole  0.25 mg Oral QHS  . sodium chloride flush  3 mL Intravenous Q12H   Continuous Infusions: PRN Meds:.albuterol, HYDROmorphone (DILAUDID) injection, ondansetron **OR** ondansetron (ZOFRAN) IV, oxyCODONE-acetaminophen **AND** oxyCODONE  Micro Results Recent Results (from the past 240 hour(s))  Culture, blood (routine x 2)     Status: None   Collection Time: 09/24/16  1:44 PM  Result Value Ref Range Status   Specimen Description BLOOD RIGHT ANTECUBITAL  Final   Special Requests BOTTLES DRAWN AEROBIC AND ANAEROBIC 5CC  Final   Culture   Final    NO GROWTH 5 DAYS Performed at [facility]    Report Status 09/29/2016 FINAL  Final  Culture, blood (routine x 2)     Status: None   Collection Time: 09/24/16  1:58 PM  Result Value Ref Range Status   Specimen Description BLOOD LEFT ANTECUBITAL  Final   Special Requests BOTTLES DRAWN AEROBIC AND ANAEROBIC 5CC  Final   Culture   Final    NO GROWTH 5 DAYS Performed at [facility]    Report Status 09/29/2016 FINAL  Final  Culture, body fluid-bottle     Status: None   Collection Time: 09/25/16  3:48 PM  Result Value Ref Range Status   Specimen Description FLUID PLEURAL RIGHT  Final   Special Requests BOTTLES DRAWN AEROBIC AND ANAEROBIC  10CC  Final   Culture   Final    NO GROWTH 5 DAYS Performed at [facility]    Report Status 09/30/2016 FINAL  Final  Gram stain     Status: None   Collection Time: 09/25/16  3:48 PM  Result Value Ref Range Status   Specimen Description FLUID PLEURAL RIGHT  Final   Special Requests NONE  Final   Gram Stain   Final    WBC PRESENT, PREDOMINANTLY MONONUCLEAR NO ORGANISMS SEEN CYTOSPIN Performed at [facility]    Report Status 09/25/2016 FINAL  Final    Radiology Reports Dg Chest 1 View  Result Date: 10/01/2016 CLINICAL DATA:  Post right thoracentesis EXAM: CHEST 1 VIEW COMPARISON:  09/28/2016 FINDINGS: Decreasing right pleural effusion following thoracentesis. No pneumothorax. Small right effusion and right base atelectasis persists. Heart is borderline in size. Left lung is clear. IMPRESSION: No pneumothorax following right thoracentesis. Small residual right effusion with right base atelectasis. Electronically Signed   By: Rolm Baptise M.D.   On:  10/01/2016 14:33   Dg Chest 1 View  Result Date: 09/25/2016 CLINICAL DATA:  Status post right thoracentesis. EXAM: CHEST 1 VIEW COMPARISON:  Radiographs of September 24, 2016. FINDINGS: Stable cardiomegaly. Atherosclerosis of thoracic aorta is noted. No pneumothorax is noted. Right pleural effusion appears to have resolved. No acute pulmonary disease is noted. Bony thorax is unremarkable. IMPRESSION: Aortic atherosclerosis. Right pleural effusion has resolved status post thoracentesis. No pneumothorax is noted. Electronically Signed   By: Marijo Conception, M.D.   On: 09/25/2016 16:44   Dg Chest 2 View  Result Date: 09/24/2016 CLINICAL DATA:  Shortness of breath and chest pain EXAM: CHEST  2 VIEW COMPARISON:  December 05, 2013 FINDINGS: There is a right pleural effusion with right base atelectasis. Lungs elsewhere are clear. Heart size and pulmonary vascularity are normal. No adenopathy. There is atherosclerotic calcification in  the aorta. No bone lesions. IMPRESSION: Right pleural effusion with right base atelectasis. Lungs elsewhere clear. There is aortic atherosclerosis. Electronically Signed   By: Lowella Grip III M.D.   On: 09/24/2016 13:20   Ct Angio Chest Pe W Or Wo Contrast  Result Date: 09/25/2016 CLINICAL DATA:  Subacute onset of right-sided chest pain. Increasing respiratory distress. Leukocytosis and elevated D-dimer. Initial encounter. EXAM: CT ANGIOGRAPHY CHEST WITH CONTRAST TECHNIQUE: Multidetector CT imaging of the chest was performed using the standard protocol during bolus administration of intravenous contrast. Multiplanar CT image reconstructions and MIPs were obtained to evaluate the vascular anatomy. CONTRAST:  100 mL of Isovue 370 IV contrast COMPARISON:  None. FINDINGS: Cardiovascular: There is no evidence of significant pulmonary embolus. Evaluation for pulmonary embolus is suboptimal in areas of airspace consolidation and due to motion artifact. Scattered calcification is noted along the aortic arch and descending thoracic aorta. The heart remains borderline normal in size. Scattered coronary artery calcifications are seen. The great vessels are unremarkable in appearance. Mediastinum/Nodes: Visualized mediastinal nodes remain normal in size. No pericardial effusion is identified. Bilateral hypoattenuating nodules are noted within the thyroid gland, measuring up to 1.7 cm on the left. No axillary lymphadenopathy is seen. The patient is status post right-sided mastectomy. Lungs/Pleura: A moderate right-sided pleural effusion is noted, with partial consolidation at the right lung base. There is a 1.7 x 1.0 cm pleural-based nodule anteriorly at the right middle lobe (image 49 of 95). Malignancy cannot be excluded. No pneumothorax is seen. Upper Abdomen: The visualized portions of the liver and spleen are grossly unremarkable. The visualized portions of the adrenal glands are within normal limits.  Musculoskeletal: No acute osseous abnormalities are identified. An hemangioma is noted at vertebral body T9. The visualized musculature is unremarkable in appearance. Review of the MIP images confirms the above findings. IMPRESSION: 1. No evidence of significant pulmonary embolus. 2. Moderate right-sided pleural effusion, with partial consolidation at the right lung base. This could reflect pneumonia. 3. Apparent 1.7 x 1.0 cm pleural-based nodule anteriorly at the right middle lobe. Malignancy cannot be excluded. PET/CT would be helpful for further evaluation. Alternatively, diagnostic thoracentesis might be considered. 4. **An incidental finding of potential clinical significance has been found. 1.7 cm hypoattenuating nodule at the left thyroid lobe. Consider further evaluation with thyroid ultrasound. If patient is clinically hyperthyroid, consider nuclear medicine thyroid uptake and scan.** 5. Scattered coronary artery calcifications seen. 6. 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 Electronically Signed   By: Garald Balding M.D.   On: 09/25/2016 00:58   Dg Chest Port 1 View  Result Date: 09/28/2016 CLINICAL DATA:  Productive cough today EXAM: PORTABLE CHEST 1 VIEW COMPARISON:  09/25/2016 FINDINGS: There is new airspace opacity obscuring the right hemidiaphragm. Atherosclerotic calcification of the aortic arch. The left lung appears clear.  Upper normal heart size. IMPRESSION: 1. Obscuration of the right hemidiaphragm with some blunting of the right lateral costophrenic angle. This could reflect recurrent right pleural effusion with passive atelectasis although right basilar pneumonia is not readily excluded. 2. Atherosclerotic aortic arch. Electronically Signed   By: Van Clines M.D.   On: 09/28/2016 10:26   US Thyroid  Result Date: 09/27/2016 CLINICAL DATA:  Incidental on CT.  Thyroid nodule seen on CT. EXAM: THYROID ULTRASOUND TECHNIQUE: Ultrasound examination of the thyroid gland and adjacent  soft tissues was performed. COMPARISON:  Chest CT 09/24/2016 FINDINGS: Parenchymal Echotexture: Mildly heterogenous Estimated total number of nodules >/= 1 cm: 3 Number of spongiform nodules >/=  2 cm not described below (TR1): 0 Number of mixed cystic and solid nodules >/= 1.5 cm not described below (TR2): 0 _________________________________________________________ Isthmus: Measures 0.4 cm in thickness. No discrete nodules are identified within the thyroid isthmus. _________________________________________________________ Right lobe: Measures 4.2 x 1.6 x 2.0 cm. Nodule # 1: Location: Isthmus; Superior Size: 1.6 x 1.3 x 1.4 cm. Composition: solid/almost completely solid (2), there is an eccentric cystic component. Echogenicity: isoechoic (1) Shape: not taller-than-wide (0) Margins: ill-defined (0) Echogenic foci: none (0) ACR TI-RADS total points: 3. ACR TI-RADS risk category: TR3 (3 points). ACR TI-RADS recommendations: *Given size (>/= 1.5 - 2.4 cm) and appearance, a follow-up ultrasound in 1 year should be considered based on TI-RADS criteria. Nodule # 2: Location: Right; Inferior Size: 0.6 x 0.4 x 0.5 cm Composition: solid/almost completely solid (2) Echogenicity: hypoechoic (2) Shape: not taller-than-wide (0) Margins: ill-defined (0) Echogenic foci: none (0) ACR TI-RADS total points: 4. ACR TI-RADS risk category: TR4 (4-6 points). ACR TI-RADS recommendations: Given size (<0.9 cm) and appearance, this nodule does NOT meet TI-RADS criteria for biopsy or dedicated follow-up. _________________________________________________________ Left lobe: Measures 4.1 x 1.6 x 1.9 cm. Nodule # 3: Location: Left; Superior Size: 1.0 x 1.0 x 1.0 cm. Composition: mixed cystic and solid (1) Echogenicity: hypoechoic (2) Shape: not taller-than-wide (0) Margins: smooth (0) Echogenic foci: none (0) ACR TI-RADS total points: 3. ACR TI-RADS risk category: TR3 (3 points). ACR TI-RADS recommendations: Given size (<1.4 cm) and appearance,  this nodule does NOT meet TI-RADS criteria for biopsy or dedicated follow-up. There is a cystic structure in the superior left thyroid lobe measuring 1.0 x 0.7 x 0.7 cm. IMPRESSION: Solid and cystic nodules in the thyroid. Largest nodule measures 1.6 cm in the superior right lobe. This nodule meets criteria for 1 year follow-up. The above is in keeping with the ACR TI-RADS recommendations - J Am Coll Radiol 2017;14:587-595. Electronically Signed   By: Markus Daft M.D.   On: 09/27/2016 07:59   US Thoracentesis Asp Pleural Space W/img Guide  Result Date: 10/01/2016 INDICATION: Recurrent right pleural effusion of unknown etiology. Request for diagnostic and therapeutic thoracentesis. EXAM: ULTRASOUND GUIDED DIAGNOSTIC AND THERAPEUTIC THORACENTESIS MEDICATIONS: 1% Lidocaine COMPLICATIONS: None immediate. PROCEDURE: An ultrasound guided thoracentesis was thoroughly discussed with the patient and questions answered. The benefits, risks, alternatives and complications were also discussed. The patient understands and wishes to proceed with the procedure. Written consent was obtained. Ultrasound was performed to localize and mark an adequate pocket of fluid in the right chest. The area was then prepped and draped in the normal sterile fashion. 1% Lidocaine was used for  local anesthesia. Under ultrasound guidance a Safe-T-Centesis catheter was introduced. Thoracentesis was performed. The catheter was removed and a dressing applied. FINDINGS: A total of approximately 1.2L of serosanguineous fluid was removed. Samples were sent to the laboratory as requested by the clinical team. IMPRESSION: Successful ultrasound guided right thoracentesis yielding 1.2L of pleural fluid. Read by: Saverio Danker, PA-C Electronically Signed   By: Lucrezia Europe M.D.   On: 10/01/2016 14:31   US Thoracentesis Asp Pleural Space W/img Guide  Result Date: 09/25/2016 INDICATION: Ex-smoker with remote history of breast cancer, COPD, rheumatoid  arthritis, dyspnea, pneumonia, right pleural effusion. Request made for diagnostic and therapeutic right thoracentesis. EXAM: ULTRASOUND GUIDED DIAGNOSTIC AND THERAPEUTIC RIGHT THORACENTESIS MEDICATIONS: None. COMPLICATIONS: None immediate. PROCEDURE: An ultrasound guided thoracentesis was thoroughly discussed with the patient and questions answered. The benefits, risks, alternatives and complications were also discussed. The patient understands and wishes to proceed with the procedure. Written consent was obtained. Ultrasound was performed to localize and mark an adequate pocket of fluid in the right chest. The area was then prepped and draped in the normal sterile fashion. 1% Lidocaine was used for local anesthesia. Under ultrasound guidance a Safe-T-Centesis catheter was introduced. Thoracentesis was performed. The catheter was removed and a dressing applied. FINDINGS: A total of approximately 1.5 liters of hazy, amber fluid was removed. Samples were sent to the laboratory as requested by the clinical team. IMPRESSION: Successful ultrasound guided diagnostic and therapeutic right thoracentesis yielding 1.5 liters of pleural fluid. Read by: Rowe Robert, PA-C Electronically Signed   By: Marybelle Killings M.D.   On: 09/25/2016 16:15    Time Spent in minutes  25   Louellen Molder M.D on 10/02/2016 at 4:34 PM  Between 7am to 7pm - Pager - [phone]  After 7pm go to [email or web address] - password [facility]  [facility] -  Office  [phone]

## 2016-10-02 NOTE — Progress Notes (Signed)
Pt plan to discharge home with [facility], referral given to in house rep.

## 2016-10-03 ENCOUNTER — Encounter: Payer: Self-pay | Admitting: *Deleted

## 2016-10-03 DIAGNOSIS — J9621 Acute and chronic respiratory failure with hypoxia: Secondary | ICD-10-CM

## 2016-10-03 DIAGNOSIS — C3491 Malignant neoplasm of unspecified part of right bronchus or lung: Principal | ICD-10-CM

## 2016-10-03 LAB — CBC
HCT: 34.4 % — ABNORMAL LOW (ref 36.0–46.0)
Hemoglobin: 10.6 g/dL — ABNORMAL LOW (ref 12.0–15.0)
MCH: 28.3 pg (ref 26.0–34.0)
MCHC: 30.8 g/dL (ref 30.0–36.0)
MCV: 92 fL (ref 78.0–100.0)
PLATELETS: 420 10*3/uL — AB (ref 150–400)
RBC: 3.74 MIL/uL — AB (ref 3.87–5.11)
RDW: 15.9 % — AB (ref 11.5–15.5)
WBC: 10.5 10*3/uL (ref 4.0–10.5)

## 2016-10-03 LAB — GLUCOSE, CAPILLARY: GLUCOSE-CAPILLARY: 136 mg/dL — AB (ref 65–99)

## 2016-10-03 MED ORDER — IPRATROPIUM-ALBUTEROL 0.5-2.5 (3) MG/3ML IN SOLN
3.0000 mL | Freq: Two times a day (BID) | RESPIRATORY_TRACT | Status: DC
  Administered 2016-10-03 – 2016-10-08 (×10): 3 mL via RESPIRATORY_TRACT
  Filled 2016-10-03 (×10): qty 3

## 2016-10-03 NOTE — Progress Notes (Signed)
Physical Therapy Treatment Patient Details Name: [name] MRN: [id number] DOB: [birth year] Today's Date: 10-23-2016    History of Present Illness 70 y.o. admitted with pleural effusion (? malignant). s/p thoracentesis. PMH of Breast cancer, RA, HTN, COPD.     PT Comments    Pt assisted with ambulating in hallway and discussed oxygen needs and saturations at rest and with mobility (please see oxygen qualification progress note).  Pt likely to d/c home today.  Follow Up Recommendations  Home health PT     Equipment Recommendations  Other (comment) (qualifies for oxygen, rollator for energy conservation)    Recommendations for Other Services       Precautions / Restrictions Precautions Precaution Comments: monitor O2    Mobility  Bed Mobility Overal bed mobility: Modified Independent                Transfers Overall transfer level: Modified independent                  Ambulation/Gait Ambulation/Gait assistance: Supervision Ambulation Distance (Feet): 120 Feet Assistive device: Rolling walker (2 wheeled) Gait Pattern/deviations: Step-through pattern;Trunk flexed     General Gait Details: verbal cues for posture and RW positioning, forward lean likely for comfort with breathing - recommended short standing rest breaks for pursed lip breathing, SpO2 88% on 4L O2 [place]   Stairs            Wheelchair Mobility    Modified Rankin (Stroke Patients Only)       Balance                                    Cognition Arousal/Alertness: Awake/alert Behavior During Therapy: WFL for tasks assessed/performed Overall Cognitive Status: Within Functional Limits for tasks assessed                      Exercises      General Comments        Pertinent Vitals/Pain Pain Assessment: No/denies pain    Home Living                      Prior Function            PT Goals (current goals can now be found in the care plan  section) Progress towards PT goals: Progressing toward goals    Frequency    Min 3X/week      PT Plan Current plan remains appropriate    Co-evaluation             End of Session Equipment Utilized During Treatment: Gait belt;Oxygen Activity Tolerance: Patient tolerated treatment well Patient left: Other (comment) (in bathroom)     Time: 4098-1191 PT Time Calculation (min) (ACUTE ONLY): 15 min  Charges:  $Gait Training: 8-22 mins                    G Codes:      Annalina Needles,KATHrine E 2016-10-23, 12:32 PM Carmelia Bake, PT, DPT 10-23-2016 Pager: [phone]

## 2016-10-03 NOTE — Progress Notes (Signed)
Oncology Nurse Navigator Documentation  Oncology Nurse Navigator Flowsheets 10/03/2016  Navigator Encounter Type Other  Confirmed Diagnosis Date 10/01/2016  Patient Visit Type Inpatient/per Dr. Worthy Flank request, I contacted [facility] to send cytology obtained on 10/01/16 for molecular testing and PDL 1  Treatment Phase Pre-Tx/Tx Discussion  Barriers/Navigation Needs Coordination of Care  Interventions Coordination of Care  Coordination of Care Other  Acuity Level 1  Time Spent with Patient 15

## 2016-10-03 NOTE — Progress Notes (Signed)
SATURATION QUALIFICATIONS: (This note is used to comply with regulatory documentation for home oxygen)  Patient Saturations on Room Air at Rest = 87%  Patient Saturations on Room Air while Ambulating = n/a  Patient Saturations on 4 Liters of oxygen while Ambulating = 88%  Please briefly explain why patient needs home oxygen: to improve oxygen saturations at rest and with physical exertion  Carmelia Bake, PT, DPT 10/03/2016 Pager: [phone]

## 2016-10-03 NOTE — Progress Notes (Signed)
PROGRESS NOTE                                                                                                                                                                                                             Patient Demographics:    [name], is a 70 y.o. female, DOB - 1946/02/21, JTT:[id number]  Admit date - 09/24/2016   Admitting Physician Dron Tanna Furry, MD  Outpatient Primary MD for the patient is [facility], MD  LOS - 9  Outpatient Specialists: None  Chief Complaint  Patient presents with  . 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   Subjective:   Patient reports some shortness of breath.   Assessment  & Plan :    Principal Problem:   Acute on chronic respiratory failure with hypoxia (HCC) -Secondary to malignant pleural effusion. Underwent thoracentesis x2.  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 -Discussed with Dr. Julien Nordmann , recommended pleurx catheter placement given her recurrent effusion. Recommends fluid to be sent for molecular study. Will arrange PET scan as outpatient. Ordered MRI brain to rule out metastases. -Initially planned to be discharged home with outpatient follow-up with IR possibly next week for Pleurx catheter placement (since patient unlikely has significant effusion at present). However patient desaturates quite easily both on room air (<90%) and on ambulation (88% on 4 L via nasal cannula). -I will monitor  her overnight and repeat chest x-ray tomorrow morning. If there is significant effusion will call IR for Pleurx catheter placement. Next line-keep nothing by mouth after midnight.  Active Problems:    COPD GOLD II  Patient needs home O2. Continue home inhaler.  Incidental left lower thyroid nodule TSH of 0.28. Normal free T3 and T4. Her ultrasound showing solid and cystic nodules . needs annual follow-up.  Right lower lobe pneumonia Possibly postobstructive. Completed antibiotics.  Hypokalemia Replenished  Essential hypertension   continue home medications      Code Status :  Full code  Family Communication  : none at bedside  Disposition Plan  : Home tomorrow if follow-up chest x-ray without significant effusion.  Barriers For Discharge : Improving symptoms  Consults  :   Pulmonary Oncology IR  Procedures  :  Right thoracentesis  DVT Prophylaxis  :   Lab Results  Component Value Date   PLT 420 (H) 10/03/2016    Antibiotics  :    Anti-infectives    Start     Dose/Rate Route Frequency Ordered Stop   09/25/16 1000  cefTRIAXone (ROCEPHIN) 1 g in dextrose 5 % 50 mL IVPB  Status:  Discontinued     1 g 100 mL/hr over 30 Minutes Intravenous Every 24 hours 09/24/16 1650 10/02/16 0939   09/25/16 1000  azithromycin (ZITHROMAX) tablet 500 mg  Status:  Discontinued     500 mg Oral Daily 09/24/16 1650 10/02/16 0939   09/24/16 1345  cefTRIAXone (ROCEPHIN) 1 g in dextrose 5 % 50 mL IVPB     1 g 100 mL/hr over 30 Minutes Intravenous  Once 09/24/16 1335 09/24/16 1500   09/24/16 1345  azithromycin (ZITHROMAX) 500 mg in dextrose 5 % 250 mL IVPB     500 mg 250 mL/hr over 60 Minutes Intravenous  Once 09/24/16 1335 09/24/16 1609        Objective:   Vitals:   10/02/16 2109 10/03/16 0156 10/03/16 0628 10/03/16 0830  BP: (!) 120/59  (!) 119/49   Pulse: 90  86   Resp: 18  17   Temp: 98.2 F (36.8 C)  98.1 F (36.7 C)   TempSrc: Oral  Oral   SpO2: 95% 98% 93% 92%    Weight:      Height:        Wt Readings from Last 3 Encounters:  09/24/16 109.6 kg (241 lb 10 oz)  03/09/15 121.6 kg (268 lb)  08/16/14 121.1 kg (267 lb)     Intake/Output Summary (Last 24 hours) at 10/03/16 1342 Last data filed at 10/03/16 0839  Gross per 24 hour  Intake              360 ml  Output                0 ml  Net              360 ml     Physical Exam  Gen: not in distress HEENT:  moist mucosa, supple neck Chest: Diminished breath sounds Over right lung base. CVS: N S1&S2, no murmurs,  GI: soft, NT, ND, Musculoskeletal: warm, no edema     Data Review:    CBC  Recent Labs Lab 09/27/16 0322 09/30/16 0449 10/01/16 0503 10/02/16 0512 10/03/16 0513  WBC 11.2* 11.1* 10.6* 10.4 10.5  HGB 10.3* 10.4* 10.7* 10.7* 10.6*  HCT 33.7* 34.5* 35.6* 35.4* 34.4*  PLT 362 433* 434* 449* 420*  MCV 91.1 92.5 92.5 92.2 92.0  MCH 27.8 27.9 27.8 27.9 28.3  MCHC 30.6 30.1 30.1 30.2 30.8  RDW 16.4* 16.2* 16.0* 16.0* 15.9*    Chemistries   Recent Labs Lab 09/27/16 0322 09/28/16 0546 09/30/16 0449 10/02/16 0512  NA 139 141 140 141  K 2.9* 3.5 3.2* 3.2*  CL 105 105 104 102  CO2 '27 28 31 '$ 32  GLUCOSE 102* 113* 111* 107*  BUN '11 9 12 12  '$ CREATININE 0.58 0.65 0.71 0.73  CALCIUM 8.4* 8.6* 8.6* 8.6*  MG  --  2.0  --   --    ------------------------------------------------------------------------------------------------------------------  No results for input(s): CHOL, HDL, LDLCALC, TRIG, CHOLHDL, LDLDIRECT in the last 72 hours.  Lab Results  Component Value Date   HGBA1C 6.4 (H) 12/03/2013   ------------------------------------------------------------------------------------------------------------------ No results for input(s): TSH, T4TOTAL, T3FREE, THYROIDAB in the last 72 hours.  Invalid input(s): FREET3 ------------------------------------------------------------------------------------------------------------------ No results for input(s): VITAMINB12,  FOLATE, FERRITIN, TIBC, IRON, RETICCTPCT in the last 72 hours.  Coagulation profile No results for input(s): INR, PROTIME in the last 168 hours.  No results for input(s): DDIMER in the last 72 hours.  Cardiac Enzymes No results for input(s): CKMB, TROPONINI, MYOGLOBIN in the last 168 hours.  Invalid input(s): CK ------------------------------------------------------------------------------------------------------------------ No results found for: BNP  Inpatient Medications  Scheduled Meds: . atorvastatin  40 mg Oral q1800  . budesonide  0.25 mg Nebulization BID  . folic acid  1 mg Oral Daily  . irbesartan  300 mg Oral Daily   And  . hydrochlorothiazide  12.5 mg Oral Daily  . ipratropium-albuterol  3 mL Nebulization BID  . metoprolol tartrate  25 mg Oral BID  . predniSONE  10 mg Oral Q breakfast  . pregabalin  200 mg Oral QHS  . rOPINIRole  0.25 mg Oral QHS  . sodium chloride flush  3 mL Intravenous Q12H   Continuous Infusions: PRN Meds:.albuterol, HYDROmorphone (DILAUDID) injection, ondansetron **OR** ondansetron (ZOFRAN) IV, oxyCODONE-acetaminophen **AND** oxyCODONE  Micro Results Recent Results (from the past 240 hour(s))  Culture, blood (routine x 2)     Status: None   Collection Time: 09/24/16  1:44 PM  Result Value Ref Range Status   Specimen Description BLOOD RIGHT ANTECUBITAL  Final   Special Requests BOTTLES DRAWN AEROBIC AND ANAEROBIC 5CC  Final   Culture   Final    NO GROWTH 5 DAYS Performed at [facility]    Report Status 09/29/2016 FINAL  Final  Culture, blood (routine x 2)     Status: None   Collection Time: 09/24/16  1:58 PM  Result Value Ref Range Status   Specimen Description BLOOD LEFT ANTECUBITAL  Final   Special Requests BOTTLES DRAWN AEROBIC AND ANAEROBIC 5CC  Final   Culture   Final    NO GROWTH 5 DAYS Performed at [facility]    Report Status 09/29/2016 FINAL  Final  Culture, body fluid-bottle     Status: None   Collection  Time: 09/25/16  3:48 PM  Result Value Ref Range Status   Specimen Description FLUID PLEURAL RIGHT  Final   Special Requests BOTTLES DRAWN AEROBIC AND ANAEROBIC 10CC  Final   Culture   Final    NO GROWTH 5 DAYS Performed at [facility]    Report Status 09/30/2016 FINAL  Final  Gram stain     Status: None   Collection Time: 09/25/16  3:48 PM  Result Value Ref Range Status   Specimen Description FLUID PLEURAL RIGHT  Final   Special Requests NONE  Final   Gram Stain   Final    WBC PRESENT, PREDOMINANTLY MONONUCLEAR NO ORGANISMS SEEN CYTOSPIN Performed at [facility]    Report Status 09/25/2016 FINAL  Final    Radiology Reports Dg Chest 1 View  Result Date: 10/01/2016 CLINICAL DATA:  Post right thoracentesis EXAM: CHEST 1 VIEW COMPARISON:  09/28/2016 FINDINGS: Decreasing right pleural effusion following thoracentesis. No pneumothorax. Small right effusion and right base atelectasis persists. Heart is borderline in size. Left lung is clear. IMPRESSION: No pneumothorax following right thoracentesis. Small residual right effusion with right base  atelectasis. Electronically Signed   By: Rolm Baptise M.D.   On: 10/01/2016 14:33   Dg Chest 1 View  Result Date: 09/25/2016 CLINICAL DATA:  Status post right thoracentesis. EXAM: CHEST 1 VIEW COMPARISON:  Radiographs of September 24, 2016. FINDINGS: Stable cardiomegaly. Atherosclerosis of thoracic aorta is noted. No pneumothorax is noted. Right pleural effusion appears to have resolved. No acute pulmonary disease is noted. Bony thorax is unremarkable. IMPRESSION: Aortic atherosclerosis. Right pleural effusion has resolved status post thoracentesis. No pneumothorax is noted. Electronically Signed   By: Marijo Conception, M.D.   On: 09/25/2016 16:44   Dg Chest 2 View  Result Date: 09/24/2016 CLINICAL DATA:  Shortness of breath and chest pain EXAM: CHEST  2 VIEW COMPARISON:  December 05, 2013 FINDINGS: There is a right pleural effusion  with right base atelectasis. Lungs elsewhere are clear. Heart size and pulmonary vascularity are normal. No adenopathy. There is atherosclerotic calcification in the aorta. No bone lesions. IMPRESSION: Right pleural effusion with right base atelectasis. Lungs elsewhere clear. There is aortic atherosclerosis. Electronically Signed   By: Lowella Grip III M.D.   On: 09/24/2016 13:20   Ct Angio Chest Pe W Or Wo Contrast  Result Date: 09/25/2016 CLINICAL DATA:  Subacute onset of right-sided chest pain. Increasing respiratory distress. Leukocytosis and elevated D-dimer. Initial encounter. EXAM: CT ANGIOGRAPHY CHEST WITH CONTRAST TECHNIQUE: Multidetector CT imaging of the chest was performed using the standard protocol during bolus administration of intravenous contrast. Multiplanar CT image reconstructions and MIPs were obtained to evaluate the vascular anatomy. CONTRAST:  100 mL of Isovue 370 IV contrast COMPARISON:  None. FINDINGS: Cardiovascular: There is no evidence of significant pulmonary embolus. Evaluation for pulmonary embolus is suboptimal in areas of airspace consolidation and due to motion artifact. Scattered calcification is noted along the aortic arch and descending thoracic aorta. The heart remains borderline normal in size. Scattered coronary artery calcifications are seen. The great vessels are unremarkable in appearance. Mediastinum/Nodes: Visualized mediastinal nodes remain normal in size. No pericardial effusion is identified. Bilateral hypoattenuating nodules are noted within the thyroid gland, measuring up to 1.7 cm on the left. No axillary lymphadenopathy is seen. The patient is status post right-sided mastectomy. Lungs/Pleura: A moderate right-sided pleural effusion is noted, with partial consolidation at the right lung base. There is a 1.7 x 1.0 cm pleural-based nodule anteriorly at the right middle lobe (image 49 of 95). Malignancy cannot be excluded. No pneumothorax is seen. Upper  Abdomen: The visualized portions of the liver and spleen are grossly unremarkable. The visualized portions of the adrenal glands are within normal limits. Musculoskeletal: No acute osseous abnormalities are identified. An hemangioma is noted at vertebral body T9. The visualized musculature is unremarkable in appearance. Review of the MIP images confirms the above findings. IMPRESSION: 1. No evidence of significant pulmonary embolus. 2. Moderate right-sided pleural effusion, with partial consolidation at the right lung base. This could reflect pneumonia. 3. Apparent 1.7 x 1.0 cm pleural-based nodule anteriorly at the right middle lobe. Malignancy cannot be excluded. PET/CT would be helpful for further evaluation. Alternatively, diagnostic thoracentesis might be considered. 4. **An incidental finding of potential clinical significance has been found. 1.7 cm hypoattenuating nodule at the left thyroid lobe. Consider further evaluation with thyroid ultrasound. If patient is clinically hyperthyroid, consider nuclear medicine thyroid uptake and scan.** 5. Scattered coronary artery calcifications seen. 6. 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 Electronically Signed   By: Garald Balding M.D.   On: 09/25/2016 00:58  Dg Chest Port 1 View  Result Date: 09/28/2016 CLINICAL DATA:  Productive cough today EXAM: PORTABLE CHEST 1 VIEW COMPARISON:  09/25/2016 FINDINGS: There is new airspace opacity obscuring the right hemidiaphragm. Atherosclerotic calcification of the aortic arch. The left lung appears clear.  Upper normal heart size. IMPRESSION: 1. Obscuration of the right hemidiaphragm with some blunting of the right lateral costophrenic angle. This could reflect recurrent right pleural effusion with passive atelectasis although right basilar pneumonia is not readily excluded. 2. Atherosclerotic aortic arch. Electronically Signed   By: Van Clines M.D.   On: 09/28/2016 10:26   US Thyroid  Result Date:  09/27/2016 CLINICAL DATA:  Incidental on CT.  Thyroid nodule seen on CT. EXAM: THYROID ULTRASOUND TECHNIQUE: Ultrasound examination of the thyroid gland and adjacent soft tissues was performed. COMPARISON:  Chest CT 09/24/2016 FINDINGS: Parenchymal Echotexture: Mildly heterogenous Estimated total number of nodules >/= 1 cm: 3 Number of spongiform nodules >/=  2 cm not described below (TR1): 0 Number of mixed cystic and solid nodules >/= 1.5 cm not described below (TR2): 0 _________________________________________________________ Isthmus: Measures 0.4 cm in thickness. No discrete nodules are identified within the thyroid isthmus. _________________________________________________________ Right lobe: Measures 4.2 x 1.6 x 2.0 cm. Nodule # 1: Location: Isthmus; Superior Size: 1.6 x 1.3 x 1.4 cm. Composition: solid/almost completely solid (2), there is an eccentric cystic component. Echogenicity: isoechoic (1) Shape: not taller-than-wide (0) Margins: ill-defined (0) Echogenic foci: none (0) ACR TI-RADS total points: 3. ACR TI-RADS risk category: TR3 (3 points). ACR TI-RADS recommendations: *Given size (>/= 1.5 - 2.4 cm) and appearance, a follow-up ultrasound in 1 year should be considered based on TI-RADS criteria. Nodule # 2: Location: Right; Inferior Size: 0.6 x 0.4 x 0.5 cm Composition: solid/almost completely solid (2) Echogenicity: hypoechoic (2) Shape: not taller-than-wide (0) Margins: ill-defined (0) Echogenic foci: none (0) ACR TI-RADS total points: 4. ACR TI-RADS risk category: TR4 (4-6 points). ACR TI-RADS recommendations: Given size (<0.9 cm) and appearance, this nodule does NOT meet TI-RADS criteria for biopsy or dedicated follow-up. _________________________________________________________ Left lobe: Measures 4.1 x 1.6 x 1.9 cm. Nodule # 3: Location: Left; Superior Size: 1.0 x 1.0 x 1.0 cm. Composition: mixed cystic and solid (1) Echogenicity: hypoechoic (2) Shape: not taller-than-wide (0) Margins: smooth  (0) Echogenic foci: none (0) ACR TI-RADS total points: 3. ACR TI-RADS risk category: TR3 (3 points). ACR TI-RADS recommendations: Given size (<1.4 cm) and appearance, this nodule does NOT meet TI-RADS criteria for biopsy or dedicated follow-up. There is a cystic structure in the superior left thyroid lobe measuring 1.0 x 0.7 x 0.7 cm. IMPRESSION: Solid and cystic nodules in the thyroid. Largest nodule measures 1.6 cm in the superior right lobe. This nodule meets criteria for 1 year follow-up. The above is in keeping with the ACR TI-RADS recommendations - J Am Coll Radiol 2017;14:587-595. Electronically Signed   By: Markus Daft M.D.   On: 09/27/2016 07:59   US Thoracentesis Asp Pleural Space W/img Guide  Result Date: 10/01/2016 INDICATION: Recurrent right pleural effusion of unknown etiology. Request for diagnostic and therapeutic thoracentesis. EXAM: ULTRASOUND GUIDED DIAGNOSTIC AND THERAPEUTIC THORACENTESIS MEDICATIONS: 1% Lidocaine COMPLICATIONS: None immediate. PROCEDURE: An ultrasound guided thoracentesis was thoroughly discussed with the patient and questions answered. The benefits, risks, alternatives and complications were also discussed. The patient understands and wishes to proceed with the procedure. Written consent was obtained. Ultrasound was performed to localize and mark an adequate pocket of fluid in the right chest. The area was then prepped  and draped in the normal sterile fashion. 1% Lidocaine was used for local anesthesia. Under ultrasound guidance a Safe-T-Centesis catheter was introduced. Thoracentesis was performed. The catheter was removed and a dressing applied. FINDINGS: A total of approximately 1.2L of serosanguineous fluid was removed. Samples were sent to the laboratory as requested by the clinical team. IMPRESSION: Successful ultrasound guided right thoracentesis yielding 1.2L of pleural fluid. Read by: Saverio Danker, PA-C Electronically Signed   By: Lucrezia Europe M.D.   On: 10/01/2016  14:31   US Thoracentesis Asp Pleural Space W/img Guide  Result Date: 09/25/2016 INDICATION: Ex-smoker with remote history of breast cancer, COPD, rheumatoid arthritis, dyspnea, pneumonia, right pleural effusion. Request made for diagnostic and therapeutic right thoracentesis. EXAM: ULTRASOUND GUIDED DIAGNOSTIC AND THERAPEUTIC RIGHT THORACENTESIS MEDICATIONS: None. COMPLICATIONS: None immediate. PROCEDURE: An ultrasound guided thoracentesis was thoroughly discussed with the patient and questions answered. The benefits, risks, alternatives and complications were also discussed. The patient understands and wishes to proceed with the procedure. Written consent was obtained. Ultrasound was performed to localize and mark an adequate pocket of fluid in the right chest. The area was then prepped and draped in the normal sterile fashion. 1% Lidocaine was used for local anesthesia. Under ultrasound guidance a Safe-T-Centesis catheter was introduced. Thoracentesis was performed. The catheter was removed and a dressing applied. FINDINGS: A total of approximately 1.5 liters of hazy, amber fluid was removed. Samples were sent to the laboratory as requested by the clinical team. IMPRESSION: Successful ultrasound guided diagnostic and therapeutic right thoracentesis yielding 1.5 liters of pleural fluid. Read by: Rowe Robert, PA-C Electronically Signed   By: Marybelle Killings M.D.   On: 09/25/2016 16:15    Time Spent in minutes  25   Louellen Molder M.D on 10/03/2016 at 1:42 PM  Between 7am to 7pm - Pager - [phone]  After 7pm go to [email or web address] - password [facility]  [facility] -  Office  [phone]

## 2016-10-04 ENCOUNTER — Inpatient Hospital Stay (HOSPITAL_COMMUNITY): Payer: Medicare Other

## 2016-10-04 LAB — CBC WITH DIFFERENTIAL/PLATELET
Basophils Absolute: 0 10*3/uL (ref 0.0–0.1)
Basophils Relative: 0 %
EOS ABS: 0.1 10*3/uL (ref 0.0–0.7)
EOS PCT: 1 %
HCT: 34.2 % — ABNORMAL LOW (ref 36.0–46.0)
Hemoglobin: 10.5 g/dL — ABNORMAL LOW (ref 12.0–15.0)
LYMPHS ABS: 2.9 10*3/uL (ref 0.7–4.0)
Lymphocytes Relative: 25 %
MCH: 28.2 pg (ref 26.0–34.0)
MCHC: 30.7 g/dL (ref 30.0–36.0)
MCV: 91.7 fL (ref 78.0–100.0)
MONO ABS: 1 10*3/uL (ref 0.1–1.0)
Monocytes Relative: 9 %
Neutro Abs: 7.5 10*3/uL (ref 1.7–7.7)
Neutrophils Relative %: 65 %
PLATELETS: 446 10*3/uL — AB (ref 150–400)
RBC: 3.73 MIL/uL — AB (ref 3.87–5.11)
RDW: 15.9 % — AB (ref 11.5–15.5)
WBC: 11.5 10*3/uL — AB (ref 4.0–10.5)

## 2016-10-04 LAB — GLUCOSE, CAPILLARY: Glucose-Capillary: 110 mg/dL — ABNORMAL HIGH (ref 65–99)

## 2016-10-04 MED ORDER — ENSURE ENLIVE PO LIQD: 237.0000 mL | Freq: Two times a day (BID) | ORAL | Status: DC

## 2016-10-04 MED ORDER — GADOBENATE DIMEGLUMINE 529 MG/ML IV SOLN
20.0000 mL | Freq: Once | INTRAVENOUS | Status: AC | PRN
  Administered 2016-10-04: 20 mL via INTRAVENOUS

## 2016-10-04 NOTE — Care Management Important Message (Signed)
Important Message  Patient Details  Name: [name] MRN: [id number] Date of Birth: 1945-11-24   Medicare Important Message Given:  Yes    Camillo Flaming 10/04/2016, 11:50 AMImportant Message  Patient Details  Name: [name] MRN: [id number] Date of Birth: August 26, 1946   Medicare Important Message Given:  Yes    Camillo Flaming 10/04/2016, 11:50 AM

## 2016-10-04 NOTE — Progress Notes (Signed)
SATURATION QUALIFICATIONS: (This note is used to comply with regulatory documentation for home oxygen)  Patient Saturations on Room Air at Rest = 87%  Patient Saturations on 2L at rest = 92%  Patient Saturations on 6Liters of oxygen while Ambulating = 84%  Please briefly explain why patient needs home oxygen:

## 2016-10-04 NOTE — Progress Notes (Signed)
PROGRESS NOTE                                                                                                                                                                                                             Patient Demographics:    [name], is a 70 y.o. female, DOB - 1946-04-24, ZOX:[id number]  Admit date - 09/24/2016   Admitting Physician Dron Tanna Furry, MD  Outpatient Primary MD for the patient is [facility], [place]  LOS - 10  Outpatient Specialists: None  Chief Complaint  Patient presents with  . 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   Subjective:   Complains of some pain in bilateral chest, worsened with inspiration.  Assessment  & Plan :    Principal Problem:   Acute on chronic respiratory failure with hypoxia (HCC) -Secondary to malignant pleural effusion. Underwent thoracentesis x2.  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 -With Dr. Julien Nordmann who recommends right-to-left catheter given frequent reaccumulation of malignant effusion. Chest x-ray done today showing slow reaccumulation of fluid. Discussed with IR, not enough fluid for catheter placement. Patient however desats to low 80s even on 6 L on ambulation. She is very likely to return to the hospital over the weekend. I will monitor here during the weekend and repeat chest x-ray on Monday. I have discussed with IR will reassess and possibly place a place pleurx catheter  then. -MRI brain to rule out brain metastases. Dr. Julien Nordmann plans on PET scan as outpatient.  Active Problems:    COPD GOLD II  Needs home O2 (currently requiring > 6L on ambulation). Continue inhalers.  Incidental left lower thyroid nodule TSH of 0.28. Normal free T3 and T4. Her ultrasound showing solid and cystic nodules . needs annual follow-up.  Right lower lobe pneumonia Possibly postobstructive. Completed antibiotics.  Hypokalemia Replenished  Essential hypertension   continue home medications      Code Status : Full code  Family  Communication  : Sister at bedside  Disposition Plan  : Home early next week after Pleurx catheter placed in  Barriers For Discharge : Persistent dyspnea  Consults  :   Pulmonary Oncology IR  Procedures  :  Right thoracentesis  DVT Prophylaxis  :   Lab Results  Component Value Date   PLT 446 (H) 10/04/2016    Antibiotics  :    Anti-infectives    Start     Dose/Rate Route Frequency Ordered Stop   09/25/16 1000  cefTRIAXone (ROCEPHIN) 1 g in dextrose 5 % 50 mL IVPB  Status:  Discontinued     1 g 100 mL/hr over 30 Minutes Intravenous Every 24 hours 09/24/16 1650 10/02/16 0939   09/25/16 1000  azithromycin (ZITHROMAX) tablet 500 mg  Status:  Discontinued     500 mg Oral Daily 09/24/16 1650 10/02/16 0939   09/24/16 1345  cefTRIAXone (ROCEPHIN) 1 g in dextrose 5 % 50 mL IVPB     1 g 100 mL/hr over 30 Minutes Intravenous  Once 09/24/16 1335 09/24/16 1500   09/24/16 1345  azithromycin (ZITHROMAX) 500 mg in dextrose 5 % 250 mL IVPB     500 mg 250 mL/hr over 60 Minutes Intravenous  Once 09/24/16 1335 09/24/16 1609        Objective:   Vitals:   10/03/16 2109 10/04/16 0615 10/04/16 0917 10/04/16 0919  BP: (!) 108/53 (!) 144/66    Pulse: 85 88    Resp: 17 16    Temp: 98 F (36.7 C) 98.3 F (36.8 C)    TempSrc: Oral Oral    SpO2: 95% 94% 92% 92%  Weight:      Height:        Wt Readings from Last 3 Encounters:  09/24/16  109.6 kg (241 lb 10 oz)  03/09/15 121.6 kg (268 lb)  08/16/14 121.1 kg (267 lb)     Intake/Output Summary (Last 24 hours) at 10/04/16 1242 Last data filed at 10/04/16 0615  Gross per 24 hour  Intake              240 ml  Output                0 ml  Net              240 ml     Physical Exam  Gen: not in distress HEENT:  moist mucosa, supple neck Chest: Diminished breath sounds over right lung base. CVS: N S1&S2, no murmurs,  GI: soft, NT, ND, Musculoskeletal: warm, no edema     Data Review:    CBC  Recent Labs Lab 09/30/16 0449 10/01/16 0503 10/02/16 0512 10/03/16 0513 10/04/16 0425  WBC 11.1* 10.6* 10.4 10.5 11.5*  HGB 10.4* 10.7* 10.7* 10.6* 10.5*  HCT 34.5* 35.6* 35.4* 34.4* 34.2*  PLT 433* 434* 449* 420* 446*  MCV 92.5 92.5 92.2 92.0 91.7  MCH 27.9 27.8 27.9 28.3 28.2  MCHC 30.1 30.1 30.2 30.8 30.7  RDW 16.2* 16.0* 16.0* 15.9* 15.9*  LYMPHSABS  --   --   --   --  2.9  MONOABS  --   --   --   --  1.0  EOSABS  --   --   --   --  0.1  BASOSABS  --   --   --   --  0.0    Chemistries   Recent Labs Lab 09/28/16 0546 09/30/16 0449 10/02/16 0512  NA 141 140 141  K 3.5 3.2* 3.2*  CL 105 104 102  CO2 28 31 32  GLUCOSE 113* 111* 107*  BUN '9 12 12  '$ CREATININE 0.65 0.71 0.73  CALCIUM 8.6* 8.6* 8.6*  MG 2.0  --   --    ------------------------------------------------------------------------------------------------------------------ No results for input(s): CHOL, HDL, LDLCALC, TRIG, CHOLHDL, LDLDIRECT in the last 72 hours.  Lab Results  Component Value Date   HGBA1C 6.4 (H) 12/03/2013   ------------------------------------------------------------------------------------------------------------------ No results for input(s): TSH, T4TOTAL, T3FREE, THYROIDAB in the last 72 hours.  Invalid input(s): FREET3 ------------------------------------------------------------------------------------------------------------------ No results for input(s):  VITAMINB12, FOLATE, FERRITIN, TIBC, IRON, RETICCTPCT in the last 72 hours.  Coagulation profile No results for input(s): INR, PROTIME in the last 168 hours.  No results for input(s): DDIMER in the last 72 hours.  Cardiac Enzymes No results for input(s): CKMB, TROPONINI, MYOGLOBIN in the last 168 hours.  Invalid input(s): CK ------------------------------------------------------------------------------------------------------------------ No results found for: BNP  Inpatient Medications  Scheduled Meds: . atorvastatin  40 mg Oral q1800  . budesonide  0.25 mg Nebulization BID  . folic acid  1 mg Oral Daily  . irbesartan  300 mg Oral Daily   And  . hydrochlorothiazide  12.5 mg Oral Daily  . ipratropium-albuterol  3 mL Nebulization BID  . metoprolol tartrate  25 mg Oral BID  . predniSONE  10 mg Oral Q breakfast  . pregabalin  200 mg Oral QHS  . rOPINIRole  0.25 mg Oral QHS  . sodium chloride flush  3 mL Intravenous Q12H   Continuous Infusions: PRN Meds:.albuterol, HYDROmorphone (DILAUDID) injection, ondansetron **OR** ondansetron (ZOFRAN) IV, oxyCODONE-acetaminophen **AND** oxyCODONE  Micro Results Recent Results (from the past 240 hour(s))  Culture, blood (routine x 2)     Status: None   Collection Time: 09/24/16  1:44 PM  Result Value Ref Range Status   Specimen Description BLOOD RIGHT ANTECUBITAL  Final   Special Requests BOTTLES DRAWN AEROBIC AND ANAEROBIC 5CC  Final   Culture   Final    NO GROWTH 5 DAYS Performed at [facility]    Report Status 09/29/2016 FINAL  Final  Culture, blood (routine x 2)     Status: None   Collection Time: 09/24/16  1:58 PM  Result Value Ref Range Status   Specimen Description BLOOD LEFT ANTECUBITAL  Final   Special Requests BOTTLES DRAWN AEROBIC AND ANAEROBIC 5CC  Final   Culture   Final    NO GROWTH 5 DAYS Performed at [facility]    Report Status 09/29/2016 FINAL  Final  Culture, body fluid-bottle     Status: None    Collection Time: 09/25/16  3:48 PM  Result Value Ref Range Status   Specimen Description FLUID PLEURAL RIGHT  Final   Special Requests BOTTLES DRAWN AEROBIC AND ANAEROBIC 10CC  Final   Culture   Final    NO GROWTH 5 DAYS Performed at [facility]    Report Status 09/30/2016 FINAL  Final  Gram stain     Status: None   Collection Time: 09/25/16  3:48 PM  Result Value Ref Range Status   Specimen Description FLUID PLEURAL RIGHT  Final   Special Requests NONE  Final   Gram Stain   Final    WBC PRESENT, PREDOMINANTLY MONONUCLEAR NO ORGANISMS SEEN CYTOSPIN Performed at [facility]    Report Status 09/25/2016 FINAL  Final    Radiology Reports Dg Chest 1 View  Result Date: 10/01/2016 CLINICAL DATA:  Post right thoracentesis  EXAM: CHEST 1 VIEW COMPARISON:  09/28/2016 FINDINGS: Decreasing right pleural effusion following thoracentesis. No pneumothorax. Small right effusion and right base atelectasis persists. Heart is borderline in size. Left lung is clear. IMPRESSION: No pneumothorax following right thoracentesis. Small residual right effusion with right base atelectasis. Electronically Signed   By: Rolm Baptise M.D.   On: 10/01/2016 14:33   Dg Chest 1 View  Result Date: 09/25/2016 CLINICAL DATA:  Status post right thoracentesis. EXAM: CHEST 1 VIEW COMPARISON:  Radiographs of September 24, 2016. FINDINGS: Stable cardiomegaly. Atherosclerosis of thoracic aorta is noted. No pneumothorax is noted. Right pleural effusion appears to have resolved. No acute pulmonary disease is noted. Bony thorax is unremarkable. IMPRESSION: Aortic atherosclerosis. Right pleural effusion has resolved status post thoracentesis. No pneumothorax is noted. Electronically Signed   By: Marijo Conception, M.D.   On: 09/25/2016 16:44   Dg Chest 2 View  Result Date: 10/04/2016 CLINICAL DATA:  Shortness of breath.  Ex-smoker. EXAM: CHEST  2 VIEW COMPARISON:  10/01/2016. FINDINGS: Increasing RIGHT pleural  effusion. Compressive atelectasis at the RIGHT base but no definite consolidation. Trace LEFT pleural effusion. No pneumothorax. Normal heart size. IMPRESSION: Beginning re-accumulation of RIGHT pleural effusion post thoracentesis. Electronically Signed   By: Staci Righter M.D.   On: 10/04/2016 10:28   Dg Chest 2 View  Result Date: 09/24/2016 CLINICAL DATA:  Shortness of breath and chest pain EXAM: CHEST  2 VIEW COMPARISON:  December 05, 2013 FINDINGS: There is a right pleural effusion with right base atelectasis. Lungs elsewhere are clear. Heart size and pulmonary vascularity are normal. No adenopathy. There is atherosclerotic calcification in the aorta. No bone lesions. IMPRESSION: Right pleural effusion with right base atelectasis. Lungs elsewhere clear. There is aortic atherosclerosis. Electronically Signed   By: Lowella Grip III M.D.   On: 09/24/2016 13:20   Ct Angio Chest Pe W Or Wo Contrast  Result Date: 09/25/2016 CLINICAL DATA:  Subacute onset of right-sided chest pain. Increasing respiratory distress. Leukocytosis and elevated D-dimer. Initial encounter. EXAM: CT ANGIOGRAPHY CHEST WITH CONTRAST TECHNIQUE: Multidetector CT imaging of the chest was performed using the standard protocol during bolus administration of intravenous contrast. Multiplanar CT image reconstructions and MIPs were obtained to evaluate the vascular anatomy. CONTRAST:  100 mL of Isovue 370 IV contrast COMPARISON:  None. FINDINGS: Cardiovascular: There is no evidence of significant pulmonary embolus. Evaluation for pulmonary embolus is suboptimal in areas of airspace consolidation and due to motion artifact. Scattered calcification is noted along the aortic arch and descending thoracic aorta. The heart remains borderline normal in size. Scattered coronary artery calcifications are seen. The great vessels are unremarkable in appearance. Mediastinum/Nodes: Visualized mediastinal nodes remain normal in size. No pericardial  effusion is identified. Bilateral hypoattenuating nodules are noted within the thyroid gland, measuring up to 1.7 cm on the left. No axillary lymphadenopathy is seen. The patient is status post right-sided mastectomy. Lungs/Pleura: A moderate right-sided pleural effusion is noted, with partial consolidation at the right lung base. There is a 1.7 x 1.0 cm pleural-based nodule anteriorly at the right middle lobe (image 49 of 95). Malignancy cannot be excluded. No pneumothorax is seen. Upper Abdomen: The visualized portions of the liver and spleen are grossly unremarkable. The visualized portions of the adrenal glands are within normal limits. Musculoskeletal: No acute osseous abnormalities are identified. An hemangioma is noted at vertebral body T9. The visualized musculature is unremarkable in appearance. Review of the MIP images confirms the above findings. IMPRESSION: 1. No  evidence of significant pulmonary embolus. 2. Moderate right-sided pleural effusion, with partial consolidation at the right lung base. This could reflect pneumonia. 3. Apparent 1.7 x 1.0 cm pleural-based nodule anteriorly at the right middle lobe. Malignancy cannot be excluded. PET/CT would be helpful for further evaluation. Alternatively, diagnostic thoracentesis might be considered. 4. **An incidental finding of potential clinical significance has been found. 1.7 cm hypoattenuating nodule at the left thyroid lobe. Consider further evaluation with thyroid ultrasound. If patient is clinically hyperthyroid, consider nuclear medicine thyroid uptake and scan.** 5. Scattered coronary artery calcifications seen. 6. 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 Electronically Signed   By: Garald Balding M.D.   On: 09/25/2016 00:58   Dg Chest Port 1 View  Result Date: 09/28/2016 CLINICAL DATA:  Productive cough today EXAM: PORTABLE CHEST 1 VIEW COMPARISON:  09/25/2016 FINDINGS: There is new airspace opacity obscuring the right hemidiaphragm.  Atherosclerotic calcification of the aortic arch. The left lung appears clear.  Upper normal heart size. IMPRESSION: 1. Obscuration of the right hemidiaphragm with some blunting of the right lateral costophrenic angle. This could reflect recurrent right pleural effusion with passive atelectasis although right basilar pneumonia is not readily excluded. 2. Atherosclerotic aortic arch. Electronically Signed   By: Van Clines M.D.   On: 09/28/2016 10:26   US Thyroid  Result Date: 09/27/2016 CLINICAL DATA:  Incidental on CT.  Thyroid nodule seen on CT. EXAM: THYROID ULTRASOUND TECHNIQUE: Ultrasound examination of the thyroid gland and adjacent soft tissues was performed. COMPARISON:  Chest CT 09/24/2016 FINDINGS: Parenchymal Echotexture: Mildly heterogenous Estimated total number of nodules >/= 1 cm: 3 Number of spongiform nodules >/=  2 cm not described below (TR1): 0 Number of mixed cystic and solid nodules >/= 1.5 cm not described below (TR2): 0 _________________________________________________________ Isthmus: Measures 0.4 cm in thickness. No discrete nodules are identified within the thyroid isthmus. _________________________________________________________ Right lobe: Measures 4.2 x 1.6 x 2.0 cm. Nodule # 1: Location: Isthmus; Superior Size: 1.6 x 1.3 x 1.4 cm. Composition: solid/almost completely solid (2), there is an eccentric cystic component. Echogenicity: isoechoic (1) Shape: not taller-than-wide (0) Margins: ill-defined (0) Echogenic foci: none (0) ACR TI-RADS total points: 3. ACR TI-RADS risk category: TR3 (3 points). ACR TI-RADS recommendations: *Given size (>/= 1.5 - 2.4 cm) and appearance, a follow-up ultrasound in 1 year should be considered based on TI-RADS criteria. Nodule # 2: Location: Right; Inferior Size: 0.6 x 0.4 x 0.5 cm Composition: solid/almost completely solid (2) Echogenicity: hypoechoic (2) Shape: not taller-than-wide (0) Margins: ill-defined (0) Echogenic foci: none (0) ACR  TI-RADS total points: 4. ACR TI-RADS risk category: TR4 (4-6 points). ACR TI-RADS recommendations: Given size (<0.9 cm) and appearance, this nodule does NOT meet TI-RADS criteria for biopsy or dedicated follow-up. _________________________________________________________ Left lobe: Measures 4.1 x 1.6 x 1.9 cm. Nodule # 3: Location: Left; Superior Size: 1.0 x 1.0 x 1.0 cm. Composition: mixed cystic and solid (1) Echogenicity: hypoechoic (2) Shape: not taller-than-wide (0) Margins: smooth (0) Echogenic foci: none (0) ACR TI-RADS total points: 3. ACR TI-RADS risk category: TR3 (3 points). ACR TI-RADS recommendations: Given size (<1.4 cm) and appearance, this nodule does NOT meet TI-RADS criteria for biopsy or dedicated follow-up. There is a cystic structure in the superior left thyroid lobe measuring 1.0 x 0.7 x 0.7 cm. IMPRESSION: Solid and cystic nodules in the thyroid. Largest nodule measures 1.6 cm in the superior right lobe. This nodule meets criteria for 1 year follow-up. The above is in keeping with the ACR  TI-RADS recommendations - J Am Coll Radiol 7615;18:[id number]. Electronically Signed   By: Markus Daft M.D.   On: 09/27/2016 07:59   US Thoracentesis Asp Pleural Space W/img Guide  Result Date: 10/01/2016 INDICATION: Recurrent right pleural effusion of unknown etiology. Request for diagnostic and therapeutic thoracentesis. EXAM: ULTRASOUND GUIDED DIAGNOSTIC AND THERAPEUTIC THORACENTESIS MEDICATIONS: 1% Lidocaine COMPLICATIONS: None immediate. PROCEDURE: An ultrasound guided thoracentesis was thoroughly discussed with the patient and questions answered. The benefits, risks, alternatives and complications were also discussed. The patient understands and wishes to proceed with the procedure. Written consent was obtained. Ultrasound was performed to localize and mark an adequate pocket of fluid in the right chest. The area was then prepped and draped in the normal sterile fashion. 1% Lidocaine was used for local  anesthesia. Under ultrasound guidance a Safe-T-Centesis catheter was introduced. Thoracentesis was performed. The catheter was removed and a dressing applied. FINDINGS: A total of approximately 1.2L of serosanguineous fluid was removed. Samples were sent to the laboratory as requested by the clinical team. IMPRESSION: Successful ultrasound guided right thoracentesis yielding 1.2L of pleural fluid. Read by: Saverio Danker, PA-C Electronically Signed   By: Lucrezia Europe M.D.   On: 10/01/2016 14:31   US Thoracentesis Asp Pleural Space W/img Guide  Result Date: 09/25/2016 INDICATION: Ex-smoker with remote history of breast cancer, COPD, rheumatoid arthritis, dyspnea, pneumonia, right pleural effusion. Request made for diagnostic and therapeutic right thoracentesis. EXAM: ULTRASOUND GUIDED DIAGNOSTIC AND THERAPEUTIC RIGHT THORACENTESIS MEDICATIONS: None. COMPLICATIONS: None immediate. PROCEDURE: An ultrasound guided thoracentesis was thoroughly discussed with the patient and questions answered. The benefits, risks, alternatives and complications were also discussed. The patient understands and wishes to proceed with the procedure. Written consent was obtained. Ultrasound was performed to localize and mark an adequate pocket of fluid in the right chest. The area was then prepped and draped in the normal sterile fashion. 1% Lidocaine was used for local anesthesia. Under ultrasound guidance a Safe-T-Centesis catheter was introduced. Thoracentesis was performed. The catheter was removed and a dressing applied. FINDINGS: A total of approximately 1.5 liters of hazy, amber fluid was removed. Samples were sent to the laboratory as requested by the clinical team. IMPRESSION: Successful ultrasound guided diagnostic and therapeutic right thoracentesis yielding 1.5 liters of pleural fluid. Read by: Rowe Robert, PA-C Electronically Signed   By: Marybelle Killings M.D.   On: 09/25/2016 16:15    Time Spent in minutes  25   Louellen Molder M.D on 10/04/2016 at 12:42 PM  Between 7am to 7pm - Pager - [phone]  After 7pm go to [email or web address] - password [facility]  [facility] -  Office  [phone]

## 2016-10-04 NOTE — Progress Notes (Signed)
Initial Nutrition Assessment  DOCUMENTATION CODES:   Obesity unspecified  INTERVENTION:   Ensure Enlive po BID, each supplement provides 350 kcal and 20 grams of protein  NUTRITION DIAGNOSIS:   Increased nutrient needs related to catabolic illness as evidenced by increased estimated energy needs.   GOAL:   Patient will meet greater than or equal to 90% of their needs  MONITOR:   Supplement acceptance, PO intake  REASON FOR ASSESSMENT:   LOS    ASSESSMENT:   70 year old female with COPD not on home O2, history of right breast malignancy status post lumpectomy and radiation about 30 years back, hypertension, recently diagnosed rheumatoid arthritis (on weekly methotrexate and daily prednisone) presented to the ED with right-sided chest pain for past 2-3 weeks duration. Patient found to have right-sided pleural effusion underwent thoracentesis with cytology now diagnosed with a stage IV (T1a, NX, M1 a) non-small cell lung cancer, adenocarcinoma   Met with pt in room today. Pt reports that she feels terrible today but she does have an appetite. Pt eating 50-100% meals. Pt reports poor appetite for 3 days pta. Pt is unsure if any weight loss r/t fluctuations secondary to edema and pleural effusion. Pt had thoracentesis on 11/28 where 1.2L of fluid was removed.   Medications reviewed and include: folic acid, hydrochlorothiazide, prednisone, oxycodone    Labs reviewed: K 3.2(L), Ca 8.6(L)  WBC 11.5(L)  Nutrition-Focused physical exam completed. Findings are no fat depletion, no muscle depletion, and no edema.   Diet Order:  Diet NPO time specified  Skin:  Reviewed, no issues  Last BM:  11/30  Height:   Ht Readings from Last 1 Encounters:  09/24/16 5' 6"  (1.676 m)    Weight:   Wt Readings from Last 1 Encounters:  09/24/16 241 lb 10 oz (109.6 kg)    Ideal Body Weight:  59 kg  BMI:  Body mass index is 39 kg/m.  Estimated Nutritional Needs:   Kcal:   1500-1800kcal/day   Protein:  109-131g/day   Fluid:  >1.5L/day   EDUCATION NEEDS:   No education needs identified at this time   Koleen Distance, RD, LDN

## 2016-10-05 LAB — GLUCOSE, CAPILLARY: Glucose-Capillary: 103 mg/dL — ABNORMAL HIGH (ref 65–99)

## 2016-10-05 MED ORDER — OXYCODONE-ACETAMINOPHEN 5-325 MG PO TABS
2.0000 | ORAL_TABLET | ORAL | Status: DC | PRN
  Administered 2016-10-05 – 2016-10-06 (×4): 2 via ORAL
  Filled 2016-10-05 (×4): qty 2

## 2016-10-05 MED ORDER — OXYCODONE HCL 5 MG PO TABS
5.0000 mg | ORAL_TABLET | Freq: Four times a day (QID) | ORAL | Status: DC | PRN
  Administered 2016-10-05 – 2016-10-06 (×2): 5 mg via ORAL
  Filled 2016-10-05 (×2): qty 1

## 2016-10-05 NOTE — Progress Notes (Signed)
PROGRESS NOTE                                                                                                                                                                                                             Patient Demographics:    [name], is a 70 y.o. female, DOB - [birth year], ZTI:[id number]  Admit date - 09/24/2016   Admitting Physician Dron Tanna Furry, MD  Outpatient Primary MD for the patient is [facility], MD  LOS - 11  Outpatient Specialists: None  Chief Complaint  Patient presents with  . 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   Subjective:   still having pain in bilateral chest, worsened with inspiration.  Assessment  & Plan :    Principal Problem:   Acute on chronic respiratory failure with hypoxia (HCC) -Secondary to malignant pleural effusion. Underwent thoracentesis x2.  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 -Dr. Julien Nordmann following. Plan on right Pleurx catheter placement given frequent reaccumulation of malignant effusion. -Patient to be monitored over the weekend since he is desaturating to the 80s even on 6 L on ambulation. Plan on repeat chest x-ray on Monday and possibly get a right Pleurx catheter. -MRI brain negative for metastases.   Active Problems:    COPD GOLD II  Needs home O2 (currently requiring > 6L on ambulation). Continue inhalers.  Incidental left lower thyroid nodule TSH of 0.28. Normal free  T3 and T4. Her ultrasound showing solid and cystic nodules . needs annual follow-up.  Right lower lobe pneumonia Possibly postobstructive. Completed antibiotics.  Hypokalemia Replenished  Essential hypertension   continue home medications   Chronic back pain On Percocet at home every 4 hours. Dose adjusted.   Code Status : Full code  Family Communication  : Sister at bedside  Disposition Plan  : Home early next week after Pleurx catheter placed  Barriers For Discharge : Persistent dyspnea  Consults  :   Pulmonary  Oncology IR  Procedures  :  Right thoracentesis  DVT Prophylaxis  :   Lab Results  Component Value Date   PLT 446 (H) 10/04/2016    Antibiotics  :    Anti-infectives    Start     Dose/Rate Route Frequency Ordered Stop   09/25/16 1000  cefTRIAXone (ROCEPHIN) 1 g in dextrose 5 % 50 mL IVPB  Status:  Discontinued     1 g 100 mL/hr over 30 Minutes Intravenous Every 24 hours 09/24/16 1650 10/02/16 0939   09/25/16 1000  azithromycin (ZITHROMAX) tablet 500 mg  Status:  Discontinued     500 mg Oral Daily 09/24/16 1650 10/02/16 0939   09/24/16 1345  cefTRIAXone (ROCEPHIN) 1 g in dextrose 5 % 50 mL IVPB     1 g 100 mL/hr over 30 Minutes Intravenous  Once 09/24/16 1335 09/24/16 1500   09/24/16 1345  azithromycin (ZITHROMAX) 500 mg in dextrose 5 % 250 mL IVPB     500 mg 250 mL/hr over 60 Minutes Intravenous  Once 09/24/16 1335 09/24/16 1609        Objective:   Vitals:   10/05/16 0500 10/05/16 0900 10/05/16 1005 10/05/16 1006  BP: 122/65 123/62    Pulse: 91 90    Resp: 18     Temp: 98.3 F (36.8 C)     TempSrc: Oral     SpO2: 94%  90% 91%  Weight:      Height:        Wt Readings from Last 3 Encounters:  09/24/16 109.6 kg (241 lb 10 oz)  03/09/15 121.6 kg (268 lb)  08/16/14 121.1 kg (267 lb)     Intake/Output Summary (Last 24 hours) at 10/05/16 1338 Last data filed at 10/05/16 0600  Gross per 24 hour  Intake              960 ml  Output                 0 ml  Net              960 ml     Physical Exam  Gen: not in distress HEENT:  moist mucosa, supple neck Chest: Diminished breath sounds over right lung base. CVS: N S1&S2, no murmurs,  GI: soft, NT, ND, Musculoskeletal: warm, no edema     Data Review:    CBC  Recent Labs Lab 09/30/16 0449 10/01/16 0503 10/02/16 0512 10/03/16 0513 10/04/16 0425  WBC 11.1* 10.6* 10.4 10.5 11.5*  HGB 10.4* 10.7* 10.7* 10.6* 10.5*  HCT 34.5* 35.6* 35.4* 34.4* 34.2*  PLT 433* 434* 449* 420* 446*  MCV 92.5 92.5 92.2 92.0 91.7  MCH 27.9 27.8 27.9 28.3 28.2  MCHC 30.1 30.1 30.2 30.8 30.7  RDW 16.2* 16.0* 16.0* 15.9* 15.9*  LYMPHSABS  --   --   --   --  2.9  MONOABS  --   --   --   --  1.0  EOSABS  --   --   --   --  0.1  BASOSABS  --   --   --   --  0.0    Chemistries   Recent Labs Lab 09/30/16 0449 10/02/16 0512  NA 140 141  K 3.2* 3.2*  CL 104 102  CO2 31 32  GLUCOSE 111* 107*  BUN 12 12  CREATININE 0.71 0.73  CALCIUM 8.6* 8.6*   ------------------------------------------------------------------------------------------------------------------ No results for input(s): CHOL, HDL, LDLCALC, TRIG, CHOLHDL, LDLDIRECT in  the last 72 hours.  Lab Results  Component Value Date   HGBA1C 6.4 (H) 12/03/2013   ------------------------------------------------------------------------------------------------------------------ No results for input(s): TSH, T4TOTAL, T3FREE, THYROIDAB in the last 72 hours.  Invalid input(s): FREET3 ------------------------------------------------------------------------------------------------------------------ No results for input(s): VITAMINB12, FOLATE, FERRITIN, TIBC, IRON, RETICCTPCT in the last 72 hours.  Coagulation profile No results for input(s): INR, PROTIME in the last 168 hours.  No results for input(s): DDIMER in the last 72 hours.  Cardiac Enzymes No results for input(s): CKMB, TROPONINI, MYOGLOBIN in the last 168  hours.  Invalid input(s): CK ------------------------------------------------------------------------------------------------------------------ No results found for: BNP  Inpatient Medications  Scheduled Meds: . atorvastatin  40 mg Oral q1800  . budesonide  0.25 mg Nebulization BID  . feeding supplement (ENSURE ENLIVE)  237 mL Oral BID BM  . folic acid  1 mg Oral Daily  . irbesartan  300 mg Oral Daily   And  . hydrochlorothiazide  12.5 mg Oral Daily  . ipratropium-albuterol  3 mL Nebulization BID  . metoprolol tartrate  25 mg Oral BID  . predniSONE  10 mg Oral Q breakfast  . pregabalin  200 mg Oral QHS  . rOPINIRole  0.25 mg Oral QHS  . sodium chloride flush  3 mL Intravenous Q12H   Continuous Infusions: PRN Meds:.albuterol, HYDROmorphone (DILAUDID) injection, ondansetron **OR** ondansetron (ZOFRAN) IV, oxyCODONE-acetaminophen **AND** oxyCODONE  Micro Results Recent Results (from the past 240 hour(s))  Culture, body fluid-bottle     Status: None   Collection Time: 09/25/16  3:48 PM  Result Value Ref Range Status   Specimen Description FLUID PLEURAL RIGHT  Final   Special Requests BOTTLES DRAWN AEROBIC AND ANAEROBIC 10CC  Final   Culture   Final    NO GROWTH 5 DAYS Performed at [facility]    Report Status 09/30/2016 FINAL  Final  Gram stain     Status: None   Collection Time: 09/25/16  3:48 PM  Result Value Ref Range Status   Specimen Description FLUID PLEURAL RIGHT  Final   Special Requests NONE  Final   Gram Stain   Final    WBC PRESENT, PREDOMINANTLY MONONUCLEAR NO ORGANISMS SEEN CYTOSPIN Performed at [facility]    Report Status 09/25/2016 FINAL  Final    Radiology Reports Dg Chest 1 View  Result Date: 10/01/2016 CLINICAL DATA:  Post right thoracentesis EXAM: CHEST 1 VIEW COMPARISON:  09/28/2016 FINDINGS: Decreasing right pleural effusion following thoracentesis. No pneumothorax. Small right effusion and right base atelectasis persists.  Heart is borderline in size. Left lung is clear. IMPRESSION: No pneumothorax following right thoracentesis. Small residual right effusion with right base atelectasis. Electronically Signed   By: Rolm Baptise M.D.   On: 10/01/2016 14:33   Dg Chest 1 View  Result Date: 09/25/2016 CLINICAL DATA:  Status post right thoracentesis. EXAM: CHEST 1 VIEW COMPARISON:  Radiographs of September 24, 2016. FINDINGS: Stable cardiomegaly. Atherosclerosis of thoracic aorta is noted. No pneumothorax is noted. Right pleural effusion appears to have resolved. No acute pulmonary disease is noted. Bony thorax is unremarkable. IMPRESSION: Aortic atherosclerosis. Right pleural effusion has resolved status post thoracentesis. No pneumothorax is noted. Electronically Signed   By: Marijo Conception, M.D.   On: 09/25/2016 16:44   Dg Chest 2 View  Result Date: 10/04/2016 CLINICAL DATA:  Shortness of breath.  Ex-smoker. EXAM: CHEST  2 VIEW COMPARISON:  10/01/2016. FINDINGS: Increasing RIGHT pleural effusion. Compressive atelectasis at the RIGHT base but no definite consolidation. Trace LEFT pleural  effusion. No pneumothorax. Normal heart size. IMPRESSION: Beginning re-accumulation of RIGHT pleural effusion post thoracentesis. Electronically Signed   By: Staci Righter M.D.   On: 10/04/2016 10:28   Dg Chest 2 View  Result Date: 09/24/2016 CLINICAL DATA:  Shortness of breath and chest pain EXAM: CHEST  2 VIEW COMPARISON:  December 05, 2013 FINDINGS: There is a right pleural effusion with right base atelectasis. Lungs elsewhere are clear. Heart size and pulmonary vascularity are normal. No adenopathy. There is atherosclerotic calcification in the aorta. No bone lesions. IMPRESSION: Right pleural effusion with right base atelectasis. Lungs elsewhere clear. There is aortic atherosclerosis. Electronically Signed   By: Lowella Grip III M.D.   On: 09/24/2016 13:20   Ct Angio Chest Pe W Or Wo Contrast  Result Date: 09/25/2016 CLINICAL  DATA:  Subacute onset of right-sided chest pain. Increasing respiratory distress. Leukocytosis and elevated D-dimer. Initial encounter. EXAM: CT ANGIOGRAPHY CHEST WITH CONTRAST TECHNIQUE: Multidetector CT imaging of the chest was performed using the standard protocol during bolus administration of intravenous contrast. Multiplanar CT image reconstructions and MIPs were obtained to evaluate the vascular anatomy. CONTRAST:  100 mL of Isovue 370 IV contrast COMPARISON:  None. FINDINGS: Cardiovascular: There is no evidence of significant pulmonary embolus. Evaluation for pulmonary embolus is suboptimal in areas of airspace consolidation and due to motion artifact. Scattered calcification is noted along the aortic arch and descending thoracic aorta. The heart remains borderline normal in size. Scattered coronary artery calcifications are seen. The great vessels are unremarkable in appearance. Mediastinum/Nodes: Visualized mediastinal nodes remain normal in size. No pericardial effusion is identified. Bilateral hypoattenuating nodules are noted within the thyroid gland, measuring up to 1.7 cm on the left. No axillary lymphadenopathy is seen. The patient is status post right-sided mastectomy. Lungs/Pleura: A moderate right-sided pleural effusion is noted, with partial consolidation at the right lung base. There is a 1.7 x 1.0 cm pleural-based nodule anteriorly at the right middle lobe (image 49 of 95). Malignancy cannot be excluded. No pneumothorax is seen. Upper Abdomen: The visualized portions of the liver and spleen are grossly unremarkable. The visualized portions of the adrenal glands are within normal limits. Musculoskeletal: No acute osseous abnormalities are identified. An hemangioma is noted at vertebral body T9. The visualized musculature is unremarkable in appearance. Review of the MIP images confirms the above findings. IMPRESSION: 1. No evidence of significant pulmonary embolus. 2. Moderate right-sided pleural  effusion, with partial consolidation at the right lung base. This could reflect pneumonia. 3. Apparent 1.7 x 1.0 cm pleural-based nodule anteriorly at the right middle lobe. Malignancy cannot be excluded. PET/CT would be helpful for further evaluation. Alternatively, diagnostic thoracentesis might be considered. 4. **An incidental finding of potential clinical significance has been found. 1.7 cm hypoattenuating nodule at the left thyroid lobe. Consider further evaluation with thyroid ultrasound. If patient is clinically hyperthyroid, consider nuclear medicine thyroid uptake and scan.** 5. Scattered coronary artery calcifications seen. 6. 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 Electronically Signed   By: Garald Balding M.D.   On: 09/25/2016 00:58   Mr Jeri Cos EU Contrast  Result Date: 10/04/2016 CLINICAL DATA:  New diagnosis of lung cancer. History of breast cancer, hypertension rheumatoid arthritis. Assess for intracranial metastasis. EXAM: MRI HEAD WITHOUT AND WITH CONTRAST TECHNIQUE: Multiplanar, multiecho pulse sequences of the brain and surrounding structures were obtained without and with intravenous contrast. CONTRAST:  53m MULTIHANCE GADOBENATE DIMEGLUMINE 529 MG/ML IV SOLN COMPARISON:  None. FINDINGS: Mild motion degraded examination. INTRACRANIAL CONTENTS:  No reduced diffusion to suggest acute ischemia or hypercellular tumor. No susceptibility artifact to suggest hemorrhage. The ventricles and sulci are normal for patient's age. Patchy supratentorial white matter FLAIR T2 hyperintensities are less than expected for age, compatible with chronic small vessel ischemic disease. No suspicious parenchymal signal, masses, mass effect. No abnormal intraparenchymal or extra-axial enhancement. No abnormal extra-axial fluid collections. No extra-axial masses. VASCULAR: Normal major intracranial vascular flow voids present at skull base. SKULL AND UPPER CERVICAL SPINE: No abnormal sellar expansion. No suspicious  calvarial bone marrow signal. Craniocervical junction maintained. Small RIGHT exostosis. SINUSES/ORBITS: RIGHT maxillary mucosal retention cyst. Small RIGHT mastoid effusion.The included ocular globes and orbital contents are non-suspicious. OTHER: None. IMPRESSION: No intracranial metastasis. Negative motion degraded MRI head with and without contrast for age. Electronically Signed   By: Elon Alas M.D.   On: 10/04/2016 18:35   Dg Chest Port 1 View  Result Date: 09/28/2016 CLINICAL DATA:  Productive cough today EXAM: PORTABLE CHEST 1 VIEW COMPARISON:  09/25/2016 FINDINGS: There is new airspace opacity obscuring the right hemidiaphragm. Atherosclerotic calcification of the aortic arch. The left lung appears clear.  Upper normal heart size. IMPRESSION: 1. Obscuration of the right hemidiaphragm with some blunting of the right lateral costophrenic angle. This could reflect recurrent right pleural effusion with passive atelectasis although right basilar pneumonia is not readily excluded. 2. Atherosclerotic aortic arch. Electronically Signed   By: Van Clines M.D.   On: 09/28/2016 10:26   US Thyroid  Result Date: 09/27/2016 CLINICAL DATA:  Incidental on CT.  Thyroid nodule seen on CT. EXAM: THYROID ULTRASOUND TECHNIQUE: Ultrasound examination of the thyroid gland and adjacent soft tissues was performed. COMPARISON:  Chest CT 09/24/2016 FINDINGS: Parenchymal Echotexture: Mildly heterogenous Estimated total number of nodules >/= 1 cm: 3 Number of spongiform nodules >/=  2 cm not described below (TR1): 0 Number of mixed cystic and solid nodules >/= 1.5 cm not described below (TR2): 0 _________________________________________________________ Isthmus: Measures 0.4 cm in thickness. No discrete nodules are identified within the thyroid isthmus. _________________________________________________________ Right lobe: Measures 4.2 x 1.6 x 2.0 cm. Nodule # 1: Location: Isthmus; Superior Size: 1.6 x 1.3 x 1.4  cm. Composition: solid/almost completely solid (2), there is an eccentric cystic component. Echogenicity: isoechoic (1) Shape: not taller-than-wide (0) Margins: ill-defined (0) Echogenic foci: none (0) ACR TI-RADS total points: 3. ACR TI-RADS risk category: TR3 (3 points). ACR TI-RADS recommendations: *Given size (>/= 1.5 - 2.4 cm) and appearance, a follow-up ultrasound in 1 year should be considered based on TI-RADS criteria. Nodule # 2: Location: Right; Inferior Size: 0.6 x 0.4 x 0.5 cm Composition: solid/almost completely solid (2) Echogenicity: hypoechoic (2) Shape: not taller-than-wide (0) Margins: ill-defined (0) Echogenic foci: none (0) ACR TI-RADS total points: 4. ACR TI-RADS risk category: TR4 (4-6 points). ACR TI-RADS recommendations: Given size (<0.9 cm) and appearance, this nodule does NOT meet TI-RADS criteria for biopsy or dedicated follow-up. _________________________________________________________ Left lobe: Measures 4.1 x 1.6 x 1.9 cm. Nodule # 3: Location: Left; Superior Size: 1.0 x 1.0 x 1.0 cm. Composition: mixed cystic and solid (1) Echogenicity: hypoechoic (2) Shape: not taller-than-wide (0) Margins: smooth (0) Echogenic foci: none (0) ACR TI-RADS total points: 3. ACR TI-RADS risk category: TR3 (3 points). ACR TI-RADS recommendations: Given size (<1.4 cm) and appearance, this nodule does NOT meet TI-RADS criteria for biopsy or dedicated follow-up. There is a cystic structure in the superior left thyroid lobe measuring 1.0 x 0.7 x 0.7 cm. IMPRESSION: Solid and cystic nodules  in the thyroid. Largest nodule measures 1.6 cm in the superior right lobe. This nodule meets criteria for 1 year follow-up. The above is in keeping with the ACR TI-RADS recommendations - J Am Coll Radiol 2017;14:587-595. Electronically Signed   By: Markus Daft M.D.   On: 09/27/2016 07:59   US Thoracentesis Asp Pleural Space W/img Guide  Result Date: 10/01/2016 INDICATION: Recurrent right pleural effusion of unknown  etiology. Request for diagnostic and therapeutic thoracentesis. EXAM: ULTRASOUND GUIDED DIAGNOSTIC AND THERAPEUTIC THORACENTESIS MEDICATIONS: 1% Lidocaine COMPLICATIONS: None immediate. PROCEDURE: An ultrasound guided thoracentesis was thoroughly discussed with the patient and questions answered. The benefits, risks, alternatives and complications were also discussed. The patient understands and wishes to proceed with the procedure. Written consent was obtained. Ultrasound was performed to localize and mark an adequate pocket of fluid in the right chest. The area was then prepped and draped in the normal sterile fashion. 1% Lidocaine was used for local anesthesia. Under ultrasound guidance a Safe-T-Centesis catheter was introduced. Thoracentesis was performed. The catheter was removed and a dressing applied. FINDINGS: A total of approximately 1.2L of serosanguineous fluid was removed. Samples were sent to the laboratory as requested by the clinical team. IMPRESSION: Successful ultrasound guided right thoracentesis yielding 1.2L of pleural fluid. Read by: Saverio Danker, PA-C Electronically Signed   By: Lucrezia Europe M.D.   On: 10/01/2016 14:31   US Thoracentesis Asp Pleural Space W/img Guide  Result Date: 09/25/2016 INDICATION: Ex-smoker with remote history of breast cancer, COPD, rheumatoid arthritis, dyspnea, pneumonia, right pleural effusion. Request made for diagnostic and therapeutic right thoracentesis. EXAM: ULTRASOUND GUIDED DIAGNOSTIC AND THERAPEUTIC RIGHT THORACENTESIS MEDICATIONS: None. COMPLICATIONS: None immediate. PROCEDURE: An ultrasound guided thoracentesis was thoroughly discussed with the patient and questions answered. The benefits, risks, alternatives and complications were also discussed. The patient understands and wishes to proceed with the procedure. Written consent was obtained. Ultrasound was performed to localize and mark an adequate pocket of fluid in the right chest. The area was then  prepped and draped in the normal sterile fashion. 1% Lidocaine was used for local anesthesia. Under ultrasound guidance a Safe-T-Centesis catheter was introduced. Thoracentesis was performed. The catheter was removed and a dressing applied. FINDINGS: A total of approximately 1.5 liters of hazy, amber fluid was removed. Samples were sent to the laboratory as requested by the clinical team. IMPRESSION: Successful ultrasound guided diagnostic and therapeutic right thoracentesis yielding 1.5 liters of pleural fluid. Read by: Rowe Robert, PA-C Electronically Signed   By: Marybelle Killings M.D.   On: 09/25/2016 16:15    Time Spent in minutes  25   Louellen Molder M.D on 10/05/2016 at 1:38 PM  Between 7am to 7pm - Pager - [phone]  After 7pm go to [email or web address] - password [email or web address]  [facility] -  Office  [phone]

## 2016-10-06 LAB — GLUCOSE, CAPILLARY: GLUCOSE-CAPILLARY: 109 mg/dL — AB (ref 65–99)

## 2016-10-06 MED ORDER — OXYCODONE HCL 5 MG PO TABS
5.0000 mg | ORAL_TABLET | ORAL | Status: DC | PRN
  Administered 2016-10-06 – 2016-10-08 (×11): 5 mg via ORAL
  Filled 2016-10-06 (×11): qty 1

## 2016-10-06 MED ORDER — OXYCODONE-ACETAMINOPHEN 5-325 MG PO TABS
1.0000 | ORAL_TABLET | ORAL | Status: DC | PRN
  Administered 2016-10-06 – 2016-10-08 (×11): 1 via ORAL
  Filled 2016-10-06 (×11): qty 1

## 2016-10-06 MED ORDER — KETOROLAC TROMETHAMINE 30 MG/ML IJ SOLN
30.0000 mg | Freq: Four times a day (QID) | INTRAMUSCULAR | Status: DC | PRN
  Administered 2016-10-06 – 2016-10-08 (×4): 30 mg via INTRAVENOUS
  Filled 2016-10-06 (×4): qty 1

## 2016-10-06 NOTE — Progress Notes (Signed)
PROGRESS NOTE                                                                                                                                                                                                             Patient Demographics:    [name], is a 70 y.o. female, DOB - 1946/09/01, ZOX:[id number]  Admit date - 09/24/2016   Admitting Physician Dron Tanna Furry, MD  Outpatient Primary MD for the patient is [facility], MD  LOS - 12  Outpatient Specialists: None  Chief Complaint  Patient presents with  . 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   Subjective:   still having pain in bilateral chest, worsened with inspiration.   Assessment  & Plan :    Principal Problem:   Acute on chronic respiratory failure with hypoxia (HCC) -Secondary to malignant pleural effusion. Underwent thoracentesis x2.  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 -Dr. Julien Nordmann following. Plan on right Pleurx catheter placement given frequent reaccumulation of malignant effusion. -Patient to be monitored over the weekend since he is desaturating to the 80s even on 6 L on ambulation. Plan on repeat chest x-ray on Monday and possibly get a right Pleurx catheter. -MRI brain negative for metastases.   Active Problems: Chronic back pain and Bilateral upper chest pain On Percocet at home every 4 hours. Dose adjusted again and added when necessary Toradol.    COPD GOLD II    Needs home O2 (currently requiring > 6L on ambulation). Continue inhalers.  Incidental left lower thyroid nodule TSH of 0.28. Normal free T3 and T4. Her ultrasound showing solid and cystic nodules . needs annual follow-up.  Right lower lobe pneumonia Possibly postobstructive. Completed antibiotics.  Hypokalemia Replenished  Essential hypertension   continue home medications      Code Status : Full code  Family Communication  : Sister at bedside  Disposition Plan  : Home early next week after Pleurx catheter placed  Barriers For Discharge : Persistent dyspnea  Consults  :   Pulmonary Oncology IR  Procedures  :  Right thoracentesis  DVT Prophylaxis  :   Lab Results  Component Value Date   PLT 446 (H) 10/04/2016    Antibiotics  :    Anti-infectives    Start     Dose/Rate Route Frequency Ordered Stop   09/25/16 1000  cefTRIAXone (ROCEPHIN) 1 g in dextrose 5 % 50 mL IVPB  Status:  Discontinued     1 g 100 mL/hr over 30 Minutes Intravenous Every 24 hours 09/24/16 1650 10/02/16 0939   09/25/16 1000  azithromycin (ZITHROMAX) tablet 500 mg  Status:  Discontinued     500 mg Oral Daily 09/24/16 1650 10/02/16 0939   09/24/16 1345  cefTRIAXone (ROCEPHIN) 1 g in dextrose 5 % 50 mL IVPB     1 g 100 mL/hr over 30 Minutes Intravenous  Once 09/24/16 1335 09/24/16 1500   09/24/16 1345  azithromycin (ZITHROMAX) 500 mg in dextrose 5 % 250 mL IVPB     500 mg 250 mL/hr over 60 Minutes Intravenous  Once 09/24/16 1335 09/24/16 1609        Objective:   Vitals:   10/05/16 2123 10/06/16 0739 10/06/16 0817 10/06/16 0820  BP: (!) 104/56 (!) 155/70    Pulse: 96 88    Resp: 18 20    Temp: 98.1 F (36.7 C) 98.4 F (36.9 C)    TempSrc: Oral Oral    SpO2: 92% 95% (!) 88% 90%  Weight:      Height:        Wt Readings from Last 3 Encounters:  09/24/16 109.6 kg (241 lb 10 oz)  03/09/15 121.6 kg (268 lb)  08/16/14 121.1 kg (267 lb)     Intake/Output Summary (Last 24 hours)  at 10/06/16 1013 Last data filed at 10/06/16 0600  Gross per 24 hour  Intake             1200 ml  Output                0 ml  Net             1200 ml     Physical Exam  Gen: not in distress HEENT:  moist mucosa, supple neck Chest: Diminished breath sounds over right lung base., Tender to pressure over bilateral upper breast. CVS: N S1&S2, no murmurs,  GI: soft, NT, ND, Musculoskeletal: warm, no edema     Data Review:    CBC  Recent Labs Lab 09/30/16 0449 10/01/16 0503 10/02/16 0512 10/03/16 0513 10/04/16 0425  WBC 11.1* 10.6* 10.4 10.5 11.5*  HGB 10.4* 10.7* 10.7* 10.6* 10.5*  HCT 34.5* 35.6* 35.4* 34.4* 34.2*  PLT 433* 434* 449* 420* 446*  MCV 92.5 92.5 92.2 92.0 91.7  MCH 27.9 27.8 27.9 28.3 28.2  MCHC 30.1 30.1 30.2 30.8 30.7  RDW 16.2* 16.0* 16.0* 15.9* 15.9*  LYMPHSABS  --   --   --   --  2.9  MONOABS  --   --   --   --  1.0  EOSABS  --   --   --   --  0.1  BASOSABS  --   --   --   --  0.0    Chemistries   Recent Labs Lab 09/30/16 0449 10/02/16 0512  NA 140 141  K 3.2* 3.2*  CL 104 102  CO2 31 32  GLUCOSE 111* 107*  BUN  12 12  CREATININE 0.71 0.73  CALCIUM 8.6* 8.6*   ------------------------------------------------------------------------------------------------------------------ No results for input(s): CHOL, HDL, LDLCALC, TRIG, CHOLHDL, LDLDIRECT in the last 72 hours.  Lab Results  Component Value Date   HGBA1C 6.4 (H) 12/03/2013   ------------------------------------------------------------------------------------------------------------------ No results for input(s): TSH, T4TOTAL, T3FREE, THYROIDAB in the last 72 hours.  Invalid input(s): FREET3 ------------------------------------------------------------------------------------------------------------------ No results for input(s): VITAMINB12, FOLATE, FERRITIN, TIBC, IRON, RETICCTPCT in the last 72 hours.  Coagulation profile No results for input(s): INR, PROTIME in the last 168  hours.  No results for input(s): DDIMER in the last 72 hours.  Cardiac Enzymes No results for input(s): CKMB, TROPONINI, MYOGLOBIN in the last 168 hours.  Invalid input(s): CK ------------------------------------------------------------------------------------------------------------------ No results found for: BNP  Inpatient Medications  Scheduled Meds: . atorvastatin  40 mg Oral q1800  . budesonide  0.25 mg Nebulization BID  . feeding supplement (ENSURE ENLIVE)  237 mL Oral BID BM  . folic acid  1 mg Oral Daily  . irbesartan  300 mg Oral Daily   And  . hydrochlorothiazide  12.5 mg Oral Daily  . ipratropium-albuterol  3 mL Nebulization BID  . metoprolol tartrate  25 mg Oral BID  . predniSONE  10 mg Oral Q breakfast  . pregabalin  200 mg Oral QHS  . rOPINIRole  0.25 mg Oral QHS  . sodium chloride flush  3 mL Intravenous Q12H   Continuous Infusions: PRN Meds:.albuterol, HYDROmorphone (DILAUDID) injection, ketorolac, ondansetron **OR** ondansetron (ZOFRAN) IV, oxyCODONE-acetaminophen **AND** oxyCODONE  Micro Results No results found for this or any previous visit (from the past 240 hour(s)).  Radiology Reports Dg Chest 1 View  Result Date: 10/01/2016 CLINICAL DATA:  Post right thoracentesis EXAM: CHEST 1 VIEW COMPARISON:  09/28/2016 FINDINGS: Decreasing right pleural effusion following thoracentesis. No pneumothorax. Small right effusion and right base atelectasis persists. Heart is borderline in size. Left lung is clear. IMPRESSION: No pneumothorax following right thoracentesis. Small residual right effusion with right base atelectasis. Electronically Signed   By: Rolm Baptise M.D.   On: 10/01/2016 14:33   Dg Chest 1 View  Result Date: 09/25/2016 CLINICAL DATA:  Status post right thoracentesis. EXAM: CHEST 1 VIEW COMPARISON:  Radiographs of September 24, 2016. FINDINGS: Stable cardiomegaly. Atherosclerosis of thoracic aorta is noted. No pneumothorax is noted. Right pleural  effusion appears to have resolved. No acute pulmonary disease is noted. Bony thorax is unremarkable. IMPRESSION: Aortic atherosclerosis. Right pleural effusion has resolved status post thoracentesis. No pneumothorax is noted. Electronically Signed   By: Marijo Conception, M.D.   On: 09/25/2016 16:44   Dg Chest 2 View  Result Date: 10/04/2016 CLINICAL DATA:  Shortness of breath.  Ex-smoker. EXAM: CHEST  2 VIEW COMPARISON:  10/01/2016. FINDINGS: Increasing RIGHT pleural effusion. Compressive atelectasis at the RIGHT base but no definite consolidation. Trace LEFT pleural effusion. No pneumothorax. Normal heart size. IMPRESSION: Beginning re-accumulation of RIGHT pleural effusion post thoracentesis. Electronically Signed   By: Staci Righter M.D.   On: 10/04/2016 10:28   Dg Chest 2 View  Result Date: 09/24/2016 CLINICAL DATA:  Shortness of breath and chest pain EXAM: CHEST  2 VIEW COMPARISON:  December 05, 2013 FINDINGS: There is a right pleural effusion with right base atelectasis. Lungs elsewhere are clear. Heart size and pulmonary vascularity are normal. No adenopathy. There is atherosclerotic calcification in the aorta. No bone lesions. IMPRESSION: Right pleural effusion with right base atelectasis. Lungs elsewhere clear. There is aortic atherosclerosis. Electronically Signed   By: Gwyndolyn Saxon  Jasmine December III M.D.   On: 09/24/2016 13:20   Ct Angio Chest Pe W Or Wo Contrast  Result Date: 09/25/2016 CLINICAL DATA:  Subacute onset of right-sided chest pain. Increasing respiratory distress. Leukocytosis and elevated D-dimer. Initial encounter. EXAM: CT ANGIOGRAPHY CHEST WITH CONTRAST TECHNIQUE: Multidetector CT imaging of the chest was performed using the standard protocol during bolus administration of intravenous contrast. Multiplanar CT image reconstructions and MIPs were obtained to evaluate the vascular anatomy. CONTRAST:  100 mL of Isovue 370 IV contrast COMPARISON:  None. FINDINGS: Cardiovascular: There is no  evidence of significant pulmonary embolus. Evaluation for pulmonary embolus is suboptimal in areas of airspace consolidation and due to motion artifact. Scattered calcification is noted along the aortic arch and descending thoracic aorta. The heart remains borderline normal in size. Scattered coronary artery calcifications are seen. The great vessels are unremarkable in appearance. Mediastinum/Nodes: Visualized mediastinal nodes remain normal in size. No pericardial effusion is identified. Bilateral hypoattenuating nodules are noted within the thyroid gland, measuring up to 1.7 cm on the left. No axillary lymphadenopathy is seen. The patient is status post right-sided mastectomy. Lungs/Pleura: A moderate right-sided pleural effusion is noted, with partial consolidation at the right lung base. There is a 1.7 x 1.0 cm pleural-based nodule anteriorly at the right middle lobe (image 49 of 95). Malignancy cannot be excluded. No pneumothorax is seen. Upper Abdomen: The visualized portions of the liver and spleen are grossly unremarkable. The visualized portions of the adrenal glands are within normal limits. Musculoskeletal: No acute osseous abnormalities are identified. An hemangioma is noted at vertebral body T9. The visualized musculature is unremarkable in appearance. Review of the MIP images confirms the above findings. IMPRESSION: 1. No evidence of significant pulmonary embolus. 2. Moderate right-sided pleural effusion, with partial consolidation at the right lung base. This could reflect pneumonia. 3. Apparent 1.7 x 1.0 cm pleural-based nodule anteriorly at the right middle lobe. Malignancy cannot be excluded. PET/CT would be helpful for further evaluation. Alternatively, diagnostic thoracentesis might be considered. 4. **An incidental finding of potential clinical significance has been found. 1.7 cm hypoattenuating nodule at the left thyroid lobe. Consider further evaluation with thyroid ultrasound. If patient is  clinically hyperthyroid, consider nuclear medicine thyroid uptake and scan.** 5. Scattered coronary artery calcifications seen. 6. 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 Electronically Signed   By: Garald Balding M.D.   On: 09/25/2016 00:58   Mr Jeri Cos EN Contrast  Result Date: 10/04/2016 CLINICAL DATA:  New diagnosis of lung cancer. History of breast cancer, hypertension rheumatoid arthritis. Assess for intracranial metastasis. EXAM: MRI HEAD WITHOUT AND WITH CONTRAST TECHNIQUE: Multiplanar, multiecho pulse sequences of the brain and surrounding structures were obtained without and with intravenous contrast. CONTRAST:  23m MULTIHANCE GADOBENATE DIMEGLUMINE 529 MG/ML IV SOLN COMPARISON:  None. FINDINGS: Mild motion degraded examination. INTRACRANIAL CONTENTS: No reduced diffusion to suggest acute ischemia or hypercellular tumor. No susceptibility artifact to suggest hemorrhage. The ventricles and sulci are normal for patient's age. Patchy supratentorial white matter FLAIR T2 hyperintensities are less than expected for age, compatible with chronic small vessel ischemic disease. No suspicious parenchymal signal, masses, mass effect. No abnormal intraparenchymal or extra-axial enhancement. No abnormal extra-axial fluid collections. No extra-axial masses. VASCULAR: Normal major intracranial vascular flow voids present at skull base. SKULL AND UPPER CERVICAL SPINE: No abnormal sellar expansion. No suspicious calvarial bone marrow signal. Craniocervical junction maintained. Small RIGHT exostosis. SINUSES/ORBITS: RIGHT maxillary mucosal retention cyst. Small RIGHT mastoid effusion.The included ocular globes and orbital contents  are non-suspicious. OTHER: None. IMPRESSION: No intracranial metastasis. Negative motion degraded MRI head with and without contrast for age. Electronically Signed   By: Elon Alas M.D.   On: 10/04/2016 18:35   Dg Chest Port 1 View  Result Date: 09/28/2016 CLINICAL DATA:   Productive cough today EXAM: PORTABLE CHEST 1 VIEW COMPARISON:  09/25/2016 FINDINGS: There is new airspace opacity obscuring the right hemidiaphragm. Atherosclerotic calcification of the aortic arch. The left lung appears clear.  Upper normal heart size. IMPRESSION: 1. Obscuration of the right hemidiaphragm with some blunting of the right lateral costophrenic angle. This could reflect recurrent right pleural effusion with passive atelectasis although right basilar pneumonia is not readily excluded. 2. Atherosclerotic aortic arch. Electronically Signed   By: Van Clines M.D.   On: 09/28/2016 10:26   US Thyroid  Result Date: 09/27/2016 CLINICAL DATA:  Incidental on CT.  Thyroid nodule seen on CT. EXAM: THYROID ULTRASOUND TECHNIQUE: Ultrasound examination of the thyroid gland and adjacent soft tissues was performed. COMPARISON:  Chest CT 09/24/2016 FINDINGS: Parenchymal Echotexture: Mildly heterogenous Estimated total number of nodules >/= 1 cm: 3 Number of spongiform nodules >/=  2 cm not described below (TR1): 0 Number of mixed cystic and solid nodules >/= 1.5 cm not described below (TR2): 0 _________________________________________________________ Isthmus: Measures 0.4 cm in thickness. No discrete nodules are identified within the thyroid isthmus. _________________________________________________________ Right lobe: Measures 4.2 x 1.6 x 2.0 cm. Nodule # 1: Location: Isthmus; Superior Size: 1.6 x 1.3 x 1.4 cm. Composition: solid/almost completely solid (2), there is an eccentric cystic component. Echogenicity: isoechoic (1) Shape: not taller-than-wide (0) Margins: ill-defined (0) Echogenic foci: none (0) ACR TI-RADS total points: 3. ACR TI-RADS risk category: TR3 (3 points). ACR TI-RADS recommendations: *Given size (>/= 1.5 - 2.4 cm) and appearance, a follow-up ultrasound in 1 year should be considered based on TI-RADS criteria. Nodule # 2: Location: Right; Inferior Size: 0.6 x 0.4 x 0.5 cm Composition:  solid/almost completely solid (2) Echogenicity: hypoechoic (2) Shape: not taller-than-wide (0) Margins: ill-defined (0) Echogenic foci: none (0) ACR TI-RADS total points: 4. ACR TI-RADS risk category: TR4 (4-6 points). ACR TI-RADS recommendations: Given size (<0.9 cm) and appearance, this nodule does NOT meet TI-RADS criteria for biopsy or dedicated follow-up. _________________________________________________________ Left lobe: Measures 4.1 x 1.6 x 1.9 cm. Nodule # 3: Location: Left; Superior Size: 1.0 x 1.0 x 1.0 cm. Composition: mixed cystic and solid (1) Echogenicity: hypoechoic (2) Shape: not taller-than-wide (0) Margins: smooth (0) Echogenic foci: none (0) ACR TI-RADS total points: 3. ACR TI-RADS risk category: TR3 (3 points). ACR TI-RADS recommendations: Given size (<1.4 cm) and appearance, this nodule does NOT meet TI-RADS criteria for biopsy or dedicated follow-up. There is a cystic structure in the superior left thyroid lobe measuring 1.0 x 0.7 x 0.7 cm. IMPRESSION: Solid and cystic nodules in the thyroid. Largest nodule measures 1.6 cm in the superior right lobe. This nodule meets criteria for 1 year follow-up. The above is in keeping with the ACR TI-RADS recommendations - J Am Coll Radiol 2017;14:587-595. Electronically Signed   By: Markus Daft M.D.   On: 09/27/2016 07:59   US Thoracentesis Asp Pleural Space W/img Guide  Result Date: 10/01/2016 INDICATION: Recurrent right pleural effusion of unknown etiology. Request for diagnostic and therapeutic thoracentesis. EXAM: ULTRASOUND GUIDED DIAGNOSTIC AND THERAPEUTIC THORACENTESIS MEDICATIONS: 1% Lidocaine COMPLICATIONS: None immediate. PROCEDURE: An ultrasound guided thoracentesis was thoroughly discussed with the patient and questions answered. The benefits, risks, alternatives and complications were also discussed.  The patient understands and wishes to proceed with the procedure. Written consent was obtained. Ultrasound was performed to localize and  mark an adequate pocket of fluid in the right chest. The area was then prepped and draped in the normal sterile fashion. 1% Lidocaine was used for local anesthesia. Under ultrasound guidance a Safe-T-Centesis catheter was introduced. Thoracentesis was performed. The catheter was removed and a dressing applied. FINDINGS: A total of approximately 1.2L of serosanguineous fluid was removed. Samples were sent to the laboratory as requested by the clinical team. IMPRESSION: Successful ultrasound guided right thoracentesis yielding 1.2L of pleural fluid. Read by: Saverio Danker, PA-C Electronically Signed   By: Lucrezia Europe M.D.   On: 10/01/2016 14:31   US Thoracentesis Asp Pleural Space W/img Guide  Result Date: 09/25/2016 INDICATION: Ex-smoker with remote history of breast cancer, COPD, rheumatoid arthritis, dyspnea, pneumonia, right pleural effusion. Request made for diagnostic and therapeutic right thoracentesis. EXAM: ULTRASOUND GUIDED DIAGNOSTIC AND THERAPEUTIC RIGHT THORACENTESIS MEDICATIONS: None. COMPLICATIONS: None immediate. PROCEDURE: An ultrasound guided thoracentesis was thoroughly discussed with the patient and questions answered. The benefits, risks, alternatives and complications were also discussed. The patient understands and wishes to proceed with the procedure. Written consent was obtained. Ultrasound was performed to localize and mark an adequate pocket of fluid in the right chest. The area was then prepped and draped in the normal sterile fashion. 1% Lidocaine was used for local anesthesia. Under ultrasound guidance a Safe-T-Centesis catheter was introduced. Thoracentesis was performed. The catheter was removed and a dressing applied. FINDINGS: A total of approximately 1.5 liters of hazy, amber fluid was removed. Samples were sent to the laboratory as requested by the clinical team. IMPRESSION: Successful ultrasound guided diagnostic and therapeutic right thoracentesis yielding 1.5 liters of  pleural fluid. Read by: Rowe Robert, PA-C Electronically Signed   By: Marybelle Killings M.D.   On: 09/25/2016 16:15    Time Spent in minutes  25   Louellen Molder M.D on 10/06/2016 at 10:13 AM  Between 7am to 7pm - Pager - [phone]  After 7pm go to [email or web address] - password [facility]  [facility] -  Office  [phone]

## 2016-10-07 ENCOUNTER — Encounter (HOSPITAL_COMMUNITY): Payer: Self-pay | Admitting: General Surgery

## 2016-10-07 ENCOUNTER — Inpatient Hospital Stay (HOSPITAL_COMMUNITY): Payer: Medicare Other

## 2016-10-07 DIAGNOSIS — J910 Malignant pleural effusion: Secondary | ICD-10-CM

## 2016-10-07 HISTORY — PX: IR GENERIC HISTORICAL: IMG1180011

## 2016-10-07 LAB — GLUCOSE, CAPILLARY: GLUCOSE-CAPILLARY: 87 mg/dL (ref 65–99)

## 2016-10-07 MED ORDER — CEFAZOLIN SODIUM-DEXTROSE 2-4 GM/100ML-% IV SOLN
2.0000 g | INTRAVENOUS | Status: AC
  Administered 2016-10-07: 2 g via INTRAVENOUS
  Filled 2016-10-07: qty 100

## 2016-10-07 MED ORDER — LIDOCAINE HCL 1 % IJ SOLN
INTRAMUSCULAR | Status: AC | PRN
  Administered 2016-10-07: 10 mL via INTRADERMAL

## 2016-10-07 MED ORDER — MIDAZOLAM HCL 2 MG/2ML IJ SOLN
INTRAMUSCULAR | Status: AC
  Filled 2016-10-07: qty 6

## 2016-10-07 MED ORDER — FENTANYL CITRATE (PF) 100 MCG/2ML IJ SOLN
INTRAMUSCULAR | Status: AC
  Filled 2016-10-07: qty 4

## 2016-10-07 MED ORDER — MIDAZOLAM HCL 2 MG/2ML IJ SOLN
INTRAMUSCULAR | Status: AC | PRN
  Administered 2016-10-07 (×2): 1 mg via INTRAVENOUS

## 2016-10-07 MED ORDER — FENTANYL CITRATE (PF) 100 MCG/2ML IJ SOLN
INTRAMUSCULAR | Status: AC | PRN
  Administered 2016-10-07 (×2): 25 ug via INTRAVENOUS

## 2016-10-07 MED ORDER — LIDOCAINE-EPINEPHRINE (PF) 2 %-1:200000 IJ SOLN
INTRAMUSCULAR | Status: AC
  Filled 2016-10-07: qty 20

## 2016-10-07 NOTE — Sedation Documentation (Signed)
NSR

## 2016-10-07 NOTE — Consult Note (Signed)
Chief Complaint: right malignant pleural effusion  Referring Physician:Dr. Flonnie Overman Dhungel  Supervising Physician: Sandi Mariscal  Patient Status: [facility] - In-pt  HPI: [name] is an 70 y.o. female who was admitted secondary to SOB.  She was found to have a right pleural effusion and it was tapped.  This recurred within a week and had this removed again.  Each time getting over 1+L of fluid.  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  The patient continues to have breathing trouble and is requiring nasal cannula and continues to have fluid reaccumulation.  We have been asked to place a PleurX catheter for easier management for the patient.    Past Medical History:  Past Medical History:  Diagnosis Date  . Cancer [facility])    right breast cancer  . COPD (chronic obstructive pulmonary disease) ([place])     Past Surgical History:  Past Surgical History:  Procedure Laterality Date  . ABDOMINAL HYSTERECTOMY  1990  . BACK SURGERY    . BREAST SURGERY Right    lumpectomy w/ radiation    Family History: History reviewed. No pertinent family history.  Social History:  reports that she quit smoking about 2 years ago. Her smoking use included Cigarettes. She has a 117.00 pack-year smoking history. She has never used smokeless tobacco. She reports that she does not drink alcohol or use drugs.  Allergies: No Known Allergies  Medications: Medications reviewed in Epic  Please HPI for pertinent positives, otherwise complete 10 system ROS negative.  Mallampati Score: MD Evaluation Airway: WNL Heart: WNL Abdomen: WNL Chest/ Lungs: WNL ASA  Classification: 3 Mallampati/Airway Score: Three  Physical Exam: BP (!) 118/57 (BP Location: Left Arm)   Pulse 88   Temp 98.1 F (36.7 C) (Oral)   Resp 18   Ht '5\' 6"'$  (1.676 m)   Wt 241 lb 10 oz (109.6 kg)   SpO2 92%   BMI 39.00 kg/m  Body mass index is 39 kg/m. General: pleasant, obese white female who is laying in bed  in NAD HEENT: head is normocephalic, atraumatic.  Sclera are noninjected.  PERRL.  Ears and nose without any masses or lesions.  Mouth is pink and moist Heart: regular, rate, and rhythm.  Normal s1,s2. No obvious murmurs, gallops, or rubs noted.  Palpable radial and pedal pulses bilaterally Lungs: decreased breath sounds throughout on the right.  CTA on the left, no wheezes, rhonchi, or rales noted.  Respiratory effort nonlabored, but [place] in place to assist with breathing Abd: soft, NT, obese, +BS, no masses or organomegaly MS: all 4 extremities are symmetrical with no cyanosis, clubbing.  +1 edema of LEs. Psych: A&Ox3 with an appropriate affect.   Labs: Results for orders placed or performed during the hospital encounter of 09/24/16 (from the past 48 hour(s))  Glucose, capillary     Status: Abnormal   Collection Time: 10/06/16  7:37 AM  Result Value Ref Range   Glucose-Capillary 109 (H) 65 - 99 mg/dL  Glucose, capillary     Status: None   Collection Time: 10/07/16  8:10 AM  Result Value Ref Range   Glucose-Capillary 87 65 - 99 mg/dL    Imaging: Dg Chest 2 View  Result Date: 10/07/2016 CLINICAL DATA:  Recurrent right pleural and fusion associated with shortness of breath and chest pain. History of COPD, former smoker, diabetes. Patient also has a history of right-sided breast malignancy. EXAM: CHEST  2 VIEW COMPARISON:  Chest x-rays of October 04, 2016,  September 24, 2016, and CT scan of the chest of September 24, 2016. FINDINGS: The left lung is mildly hyperinflated. There is no left-sided pleural effusion. On the right there is a small pleural effusion which has increased slightly in size over the past 3 days. There is no pneumothorax. There is stable ovoid density projecting over the posterior aspect of the right seventh rib and patchy interstitial density more inferiorly. Is calcification in the wall of the aortic arch. The heart and pulmonary vascularity are normal. The bony thorax exhibits  no acute abnormality. The heart and pulmonary vascularity are normal. The mediastinum is normal in width. IMPRESSION: Slight interval increase in the volume of the small right pleural effusion. Stable underlying chronic bronchitic changes. Patchy density in the right mid lung is stable. Thoracic aortic atherosclerosis. Electronically Signed   By: David  Martinique M.D.   On: 10/07/2016 09:41    Assessment/Plan 1. Recurrent malignant right pleural effusion -we will plan to proceed with PleurX catheter placement today if able. -she is NPO -she has ancef ordered for surgical prophylaxis -Risks and Benefits discussed with the patient including bleeding, infection, damage to adjacent structures, and sepsis. All of the patient's questions were answered, patient is agreeable to proceed. Consent signed and in chart.  Thank you for this interesting consult.  I greatly enjoyed meeting [name] and look forward to participating in their care.  A copy of this report was sent to the requesting provider on this date.  Electronically Signed: Henreitta Cea 10/07/2016, 10:35 AM   I spent a total of 40 Minutes    in face to face in clinical consultation, greater than 50% of which was counseling/coordinating care for recurrent malignant right pleural effusion

## 2016-10-07 NOTE — Progress Notes (Signed)
PROGRESS NOTE                                                                                                                                                                                                             Patient Demographics:    [name], is a 70 y.o. female, DOB - 01/04/1946, VWP:[id number]  Admit date - 09/24/2016   Admitting Physician Dron Tanna Furry, MD  Outpatient Primary MD for the patient is [facility], [place]  LOS - 13  Outpatient Specialists: None  Chief Complaint  Patient presents with  . 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   Subjective:  Pain better today.    Assessment  & Plan :    Principal Problem:   Acute on chronic respiratory failure with hypoxia (HCC) -Secondary to malignant pleural effusion. Underwent thoracentesis x2.  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 -Dr. Julien Nordmann following. Repeat CXR today shows increasing right  Effusion.  Pleurx catheter placement per IR. -MRI brain negative for metastases. -will assess home o2 requirement after catheter placed. ( currently high o2 needs)   Active Problems: Chronic back pain and Bilateral upper chest pain On Percocet at home every 4 hours. Dose adjusted again and added when necessary Toradol. Symptoms better today.    COPD GOLD II  Needs home O2 (currently requiring > 6L on ambulation). Continue inhalers.  Incidental left lower thyroid nodule TSH of 0.28.  Normal free T3 and T4. Her ultrasound showing solid and cystic nodules . needs annual follow-up.  Right lower lobe pneumonia Possibly postobstructive. Completed antibiotics.  Hypokalemia Replenished  Essential hypertension   continue home medications      Code Status : Full code  Family Communication  : Sister at bedside  Disposition Plan  : Home with [facility] tomorrow if stable with pleurx catheter placed and pain improved  Barriers For Discharge : Persistent dyspnea , rt pleurx catheter  Consults  :   Pulmonary  Oncology IR  Procedures  :  Right thoracentesis  DVT Prophylaxis  :   Lab Results  Component Value Date   PLT 446 (H) 10/04/2016    Antibiotics  :    Anti-infectives    Start     Dose/Rate Route Frequency Ordered Stop   10/07/16 1100  ceFAZolin (ANCEF) IVPB 2g/100 mL premix     2 g 200 mL/hr over 30 Minutes Intravenous To Radiology 10/07/16 1032 10/08/16 1100   09/25/16 1000  cefTRIAXone (ROCEPHIN) 1 g in dextrose 5 % 50 mL IVPB  Status:  Discontinued     1 g 100 mL/hr over 30 Minutes Intravenous Every 24 hours 09/24/16 1650 10/02/16 0939   09/25/16 1000  azithromycin (ZITHROMAX) tablet 500 mg  Status:  Discontinued     500 mg Oral Daily 09/24/16 1650 10/02/16 0939   09/24/16 1345  cefTRIAXone (ROCEPHIN) 1 g in dextrose 5 % 50 mL IVPB     1 g 100 mL/hr over 30 Minutes Intravenous  Once 09/24/16 1335 09/24/16 1500   09/24/16 1345  azithromycin (ZITHROMAX) 500 mg in dextrose 5 % 250 mL IVPB     500 mg 250 mL/hr over 60 Minutes Intravenous  Once 09/24/16 1335 09/24/16 1609        Objective:   Vitals:   10/06/16 2043 10/07/16 0542 10/07/16 0833 10/07/16 1011  BP: (!) 109/49 (!) 101/50  (!) 118/57  Pulse: 72 73  88  Resp: '16 18  18  '$ Temp: 97.7 F (36.5 C) 98.1 F (36.7 C)    TempSrc: Oral Oral    SpO2: 93% 95% 93% 92%  Weight:      Height:        Wt Readings from Last 3 Encounters:  09/24/16 109.6 kg (241 lb 10 oz)  03/09/15 121.6 kg (268 lb)    08/16/14 121.1 kg (267 lb)     Intake/Output Summary (Last 24 hours) at 10/07/16 1308 Last data filed at 10/07/16 1000  Gross per 24 hour  Intake              600 ml  Output                0 ml  Net              600 ml     Physical Exam  Gen: not in distress HEENT:  moist mucosa, supple neck Chest: Diminished breath sounds over right lung base., CVS: N S1&S2, no murmurs,  GI: soft, NT, ND, Musculoskeletal: warm, no edema     Data Review:    CBC  Recent Labs Lab 10/01/16 0503 10/02/16 0512 10/03/16 0513 10/04/16 0425  WBC 10.6* 10.4 10.5 11.5*  HGB 10.7* 10.7* 10.6* 10.5*  HCT 35.6* 35.4* 34.4* 34.2*  PLT 434* 449* 420* 446*  MCV 92.5 92.2 92.0 91.7  MCH 27.8 27.9 28.3 28.2  MCHC 30.1 30.2 30.8 30.7  RDW 16.0* 16.0* 15.9* 15.9*  LYMPHSABS  --   --   --  2.9  MONOABS  --   --   --  1.0  EOSABS  --   --   --  0.1  BASOSABS  --   --   --  0.0    Chemistries   Recent Labs Lab 10/02/16 0512  NA 141  K 3.2*  CL 102  CO2 32  GLUCOSE 107*  BUN 12  CREATININE 0.73  CALCIUM 8.6*   ------------------------------------------------------------------------------------------------------------------ No results for input(s): CHOL, HDL, LDLCALC,  TRIG, CHOLHDL, LDLDIRECT in the last 72 hours.  Lab Results  Component Value Date   HGBA1C 6.4 (H) 12/03/2013   ------------------------------------------------------------------------------------------------------------------ No results for input(s): TSH, T4TOTAL, T3FREE, THYROIDAB in the last 72 hours.  Invalid input(s): FREET3 ------------------------------------------------------------------------------------------------------------------ No results for input(s): VITAMINB12, FOLATE, FERRITIN, TIBC, IRON, RETICCTPCT in the last 72 hours.  Coagulation profile No results for input(s): INR, PROTIME in the last 168 hours.  No results for input(s): DDIMER in the last 72 hours.  Cardiac Enzymes No results for  input(s): CKMB, TROPONINI, MYOGLOBIN in the last 168 hours.  Invalid input(s): CK ------------------------------------------------------------------------------------------------------------------ No results found for: BNP  Inpatient Medications  Scheduled Meds: . atorvastatin  40 mg Oral q1800  . budesonide  0.25 mg Nebulization BID  .  ceFAZolin (ANCEF) IV  2 g Intravenous to XRAY  . feeding supplement (ENSURE ENLIVE)  237 mL Oral BID BM  . folic acid  1 mg Oral Daily  . irbesartan  300 mg Oral Daily   And  . hydrochlorothiazide  12.5 mg Oral Daily  . ipratropium-albuterol  3 mL Nebulization BID  . metoprolol tartrate  25 mg Oral BID  . predniSONE  10 mg Oral Q breakfast  . pregabalin  200 mg Oral QHS  . rOPINIRole  0.25 mg Oral QHS  . sodium chloride flush  3 mL Intravenous Q12H   Continuous Infusions: PRN Meds:.albuterol, HYDROmorphone (DILAUDID) injection, ketorolac, ondansetron **OR** ondansetron (ZOFRAN) IV, oxyCODONE-acetaminophen **AND** oxyCODONE  Micro Results No results found for this or any previous visit (from the past 240 hour(s)).  Radiology Reports Dg Chest 1 View  Result Date: 10/01/2016 CLINICAL DATA:  Post right thoracentesis EXAM: CHEST 1 VIEW COMPARISON:  09/28/2016 FINDINGS: Decreasing right pleural effusion following thoracentesis. No pneumothorax. Small right effusion and right base atelectasis persists. Heart is borderline in size. Left lung is clear. IMPRESSION: No pneumothorax following right thoracentesis. Small residual right effusion with right base atelectasis. Electronically Signed   By: Rolm Baptise M.D.   On: 10/01/2016 14:33   Dg Chest 1 View  Result Date: 09/25/2016 CLINICAL DATA:  Status post right thoracentesis. EXAM: CHEST 1 VIEW COMPARISON:  Radiographs of September 24, 2016. FINDINGS: Stable cardiomegaly. Atherosclerosis of thoracic aorta is noted. No pneumothorax is noted. Right pleural effusion appears to have resolved. No acute  pulmonary disease is noted. Bony thorax is unremarkable. IMPRESSION: Aortic atherosclerosis. Right pleural effusion has resolved status post thoracentesis. No pneumothorax is noted. Electronically Signed   By: Marijo Conception, M.D.   On: 09/25/2016 16:44   Dg Chest 2 View  Result Date: 10/07/2016 CLINICAL DATA:  Recurrent right pleural and fusion associated with shortness of breath and chest pain. History of COPD, former smoker, diabetes. Patient also has a history of right-sided breast malignancy. EXAM: CHEST  2 VIEW COMPARISON:  Chest x-rays of October 04, 2016, September 24, 2016, and CT scan of the chest of September 24, 2016. FINDINGS: The left lung is mildly hyperinflated. There is no left-sided pleural effusion. On the right there is a small pleural effusion which has increased slightly in size over the past 3 days. There is no pneumothorax. There is stable ovoid density projecting over the posterior aspect of the right seventh rib and patchy interstitial density more inferiorly. Is calcification in the wall of the aortic arch. The heart and pulmonary vascularity are normal. The bony thorax exhibits no acute abnormality. The heart and pulmonary vascularity are normal. The mediastinum is normal in width. IMPRESSION: Slight interval increase  in the volume of the small right pleural effusion. Stable underlying chronic bronchitic changes. Patchy density in the right mid lung is stable. Thoracic aortic atherosclerosis. Electronically Signed   By: David  Martinique M.D.   On: 10/07/2016 09:41   Dg Chest 2 View  Result Date: 10/04/2016 CLINICAL DATA:  Shortness of breath.  Ex-smoker. EXAM: CHEST  2 VIEW COMPARISON:  10/01/2016. FINDINGS: Increasing RIGHT pleural effusion. Compressive atelectasis at the RIGHT base but no definite consolidation. Trace LEFT pleural effusion. No pneumothorax. Normal heart size. IMPRESSION: Beginning re-accumulation of RIGHT pleural effusion post thoracentesis. Electronically Signed    By: Staci Righter M.D.   On: 10/04/2016 10:28   Dg Chest 2 View  Result Date: 09/24/2016 CLINICAL DATA:  Shortness of breath and chest pain EXAM: CHEST  2 VIEW COMPARISON:  December 05, 2013 FINDINGS: There is a right pleural effusion with right base atelectasis. Lungs elsewhere are clear. Heart size and pulmonary vascularity are normal. No adenopathy. There is atherosclerotic calcification in the aorta. No bone lesions. IMPRESSION: Right pleural effusion with right base atelectasis. Lungs elsewhere clear. There is aortic atherosclerosis. Electronically Signed   By: Lowella Grip III M.D.   On: 09/24/2016 13:20   Ct Angio Chest Pe W Or Wo Contrast  Result Date: 09/25/2016 CLINICAL DATA:  Subacute onset of right-sided chest pain. Increasing respiratory distress. Leukocytosis and elevated D-dimer. Initial encounter. EXAM: CT ANGIOGRAPHY CHEST WITH CONTRAST TECHNIQUE: Multidetector CT imaging of the chest was performed using the standard protocol during bolus administration of intravenous contrast. Multiplanar CT image reconstructions and MIPs were obtained to evaluate the vascular anatomy. CONTRAST:  100 mL of Isovue 370 IV contrast COMPARISON:  None. FINDINGS: Cardiovascular: There is no evidence of significant pulmonary embolus. Evaluation for pulmonary embolus is suboptimal in areas of airspace consolidation and due to motion artifact. Scattered calcification is noted along the aortic arch and descending thoracic aorta. The heart remains borderline normal in size. Scattered coronary artery calcifications are seen. The great vessels are unremarkable in appearance. Mediastinum/Nodes: Visualized mediastinal nodes remain normal in size. No pericardial effusion is identified. Bilateral hypoattenuating nodules are noted within the thyroid gland, measuring up to 1.7 cm on the left. No axillary lymphadenopathy is seen. The patient is status post right-sided mastectomy. Lungs/Pleura: A moderate right-sided  pleural effusion is noted, with partial consolidation at the right lung base. There is a 1.7 x 1.0 cm pleural-based nodule anteriorly at the right middle lobe (image 49 of 95). Malignancy cannot be excluded. No pneumothorax is seen. Upper Abdomen: The visualized portions of the liver and spleen are grossly unremarkable. The visualized portions of the adrenal glands are within normal limits. Musculoskeletal: No acute osseous abnormalities are identified. An hemangioma is noted at vertebral body T9. The visualized musculature is unremarkable in appearance. Review of the MIP images confirms the above findings. IMPRESSION: 1. No evidence of significant pulmonary embolus. 2. Moderate right-sided pleural effusion, with partial consolidation at the right lung base. This could reflect pneumonia. 3. Apparent 1.7 x 1.0 cm pleural-based nodule anteriorly at the right middle lobe. Malignancy cannot be excluded. PET/CT would be helpful for further evaluation. Alternatively, diagnostic thoracentesis might be considered. 4. **An incidental finding of potential clinical significance has been found. 1.7 cm hypoattenuating nodule at the left thyroid lobe. Consider further evaluation with thyroid ultrasound. If patient is clinically hyperthyroid, consider nuclear medicine thyroid uptake and scan.** 5. Scattered coronary artery calcifications seen. 6. 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 Electronically Signed   By:  Garald Balding M.D.   On: 09/25/2016 00:58   Mr Jeri Cos BZ Contrast  Result Date: 10/04/2016 CLINICAL DATA:  New diagnosis of lung cancer. History of breast cancer, hypertension rheumatoid arthritis. Assess for intracranial metastasis. EXAM: MRI HEAD WITHOUT AND WITH CONTRAST TECHNIQUE: Multiplanar, multiecho pulse sequences of the brain and surrounding structures were obtained without and with intravenous contrast. CONTRAST:  34m MULTIHANCE GADOBENATE DIMEGLUMINE 529 MG/ML IV SOLN COMPARISON:  None. FINDINGS: Mild  motion degraded examination. INTRACRANIAL CONTENTS: No reduced diffusion to suggest acute ischemia or hypercellular tumor. No susceptibility artifact to suggest hemorrhage. The ventricles and sulci are normal for patient's age. Patchy supratentorial white matter FLAIR T2 hyperintensities are less than expected for age, compatible with chronic small vessel ischemic disease. No suspicious parenchymal signal, masses, mass effect. No abnormal intraparenchymal or extra-axial enhancement. No abnormal extra-axial fluid collections. No extra-axial masses. VASCULAR: Normal major intracranial vascular flow voids present at skull base. SKULL AND UPPER CERVICAL SPINE: No abnormal sellar expansion. No suspicious calvarial bone marrow signal. Craniocervical junction maintained. Small RIGHT exostosis. SINUSES/ORBITS: RIGHT maxillary mucosal retention cyst. Small RIGHT mastoid effusion.The included ocular globes and orbital contents are non-suspicious. OTHER: None. IMPRESSION: No intracranial metastasis. Negative motion degraded MRI head with and without contrast for age. Electronically Signed   By: CElon AlasM.D.   On: 10/04/2016 18:35   Dg Chest Port 1 View  Result Date: 09/28/2016 CLINICAL DATA:  Productive cough today EXAM: PORTABLE CHEST 1 VIEW COMPARISON:  09/25/2016 FINDINGS: There is new airspace opacity obscuring the right hemidiaphragm. Atherosclerotic calcification of the aortic arch. The left lung appears clear.  Upper normal heart size. IMPRESSION: 1. Obscuration of the right hemidiaphragm with some blunting of the right lateral costophrenic angle. This could reflect recurrent right pleural effusion with passive atelectasis although right basilar pneumonia is not readily excluded. 2. Atherosclerotic aortic arch. Electronically Signed   By: WVan ClinesM.D.   On: 09/28/2016 10:26   UKoreaThyroid  Result Date: 09/27/2016 CLINICAL DATA:  Incidental on CT.  Thyroid nodule seen on CT. EXAM: THYROID  ULTRASOUND TECHNIQUE: Ultrasound examination of the thyroid gland and adjacent soft tissues was performed. COMPARISON:  Chest CT 09/24/2016 FINDINGS: Parenchymal Echotexture: Mildly heterogenous Estimated total number of nodules >/= 1 cm: 3 Number of spongiform nodules >/=  2 cm not described below (TR1): 0 Number of mixed cystic and solid nodules >/= 1.5 cm not described below (TR2): 0 _________________________________________________________ Isthmus: Measures 0.4 cm in thickness. No discrete nodules are identified within the thyroid isthmus. _________________________________________________________ Right lobe: Measures 4.2 x 1.6 x 2.0 cm. Nodule # 1: Location: Isthmus; Superior Size: 1.6 x 1.3 x 1.4 cm. Composition: solid/almost completely solid (2), there is an eccentric cystic component. Echogenicity: isoechoic (1) Shape: not taller-than-wide (0) Margins: ill-defined (0) Echogenic foci: none (0) ACR TI-RADS total points: 3. ACR TI-RADS risk category: TR3 (3 points). ACR TI-RADS recommendations: *Given size (>/= 1.5 - 2.4 cm) and appearance, a follow-up ultrasound in 1 year should be considered based on TI-RADS criteria. Nodule # 2: Location: Right; Inferior Size: 0.6 x 0.4 x 0.5 cm Composition: solid/almost completely solid (2) Echogenicity: hypoechoic (2) Shape: not taller-than-wide (0) Margins: ill-defined (0) Echogenic foci: none (0) ACR TI-RADS total points: 4. ACR TI-RADS risk category: TR4 (4-6 points). ACR TI-RADS recommendations: Given size (<0.9 cm) and appearance, this nodule does NOT meet TI-RADS criteria for biopsy or dedicated follow-up. _________________________________________________________ Left lobe: Measures 4.1 x 1.6 x 1.9 cm. Nodule # 3: Location: Left;  Superior Size: 1.0 x 1.0 x 1.0 cm. Composition: mixed cystic and solid (1) Echogenicity: hypoechoic (2) Shape: not taller-than-wide (0) Margins: smooth (0) Echogenic foci: none (0) ACR TI-RADS total points: 3. ACR TI-RADS risk category: TR3  (3 points). ACR TI-RADS recommendations: Given size (<1.4 cm) and appearance, this nodule does NOT meet TI-RADS criteria for biopsy or dedicated follow-up. There is a cystic structure in the superior left thyroid lobe measuring 1.0 x 0.7 x 0.7 cm. IMPRESSION: Solid and cystic nodules in the thyroid. Largest nodule measures 1.6 cm in the superior right lobe. This nodule meets criteria for 1 year follow-up. The above is in keeping with the ACR TI-RADS recommendations - J Am Coll Radiol 2017;14:587-595. Electronically Signed   By: Markus Daft M.D.   On: 09/27/2016 07:59   US Thoracentesis Asp Pleural Space W/img Guide  Result Date: 10/01/2016 INDICATION: Recurrent right pleural effusion of unknown etiology. Request for diagnostic and therapeutic thoracentesis. EXAM: ULTRASOUND GUIDED DIAGNOSTIC AND THERAPEUTIC THORACENTESIS MEDICATIONS: 1% Lidocaine COMPLICATIONS: None immediate. PROCEDURE: An ultrasound guided thoracentesis was thoroughly discussed with the patient and questions answered. The benefits, risks, alternatives and complications were also discussed. The patient understands and wishes to proceed with the procedure. Written consent was obtained. Ultrasound was performed to localize and mark an adequate pocket of fluid in the right chest. The area was then prepped and draped in the normal sterile fashion. 1% Lidocaine was used for local anesthesia. Under ultrasound guidance a Safe-T-Centesis catheter was introduced. Thoracentesis was performed. The catheter was removed and a dressing applied. FINDINGS: A total of approximately 1.2L of serosanguineous fluid was removed. Samples were sent to the laboratory as requested by the clinical team. IMPRESSION: Successful ultrasound guided right thoracentesis yielding 1.2L of pleural fluid. Read by: Saverio Danker, PA-C Electronically Signed   By: Lucrezia Europe M.D.   On: 10/01/2016 14:31   US Thoracentesis Asp Pleural Space W/img Guide  Result Date:  09/25/2016 INDICATION: Ex-smoker with remote history of breast cancer, COPD, rheumatoid arthritis, dyspnea, pneumonia, right pleural effusion. Request made for diagnostic and therapeutic right thoracentesis. EXAM: ULTRASOUND GUIDED DIAGNOSTIC AND THERAPEUTIC RIGHT THORACENTESIS MEDICATIONS: None. COMPLICATIONS: None immediate. PROCEDURE: An ultrasound guided thoracentesis was thoroughly discussed with the patient and questions answered. The benefits, risks, alternatives and complications were also discussed. The patient understands and wishes to proceed with the procedure. Written consent was obtained. Ultrasound was performed to localize and mark an adequate pocket of fluid in the right chest. The area was then prepped and draped in the normal sterile fashion. 1% Lidocaine was used for local anesthesia. Under ultrasound guidance a Safe-T-Centesis catheter was introduced. Thoracentesis was performed. The catheter was removed and a dressing applied. FINDINGS: A total of approximately 1.5 liters of hazy, amber fluid was removed. Samples were sent to the laboratory as requested by the clinical team. IMPRESSION: Successful ultrasound guided diagnostic and therapeutic right thoracentesis yielding 1.5 liters of pleural fluid. Read by: Rowe Robert, PA-C Electronically Signed   By: Marybelle Killings M.D.   On: 09/25/2016 16:15    Time Spent in minutes  25   Louellen Molder M.D on 10/07/2016 at 1:08 PM  Between 7am to 7pm - Pager - [phone]  After 7pm go to [email or web address] - password [facility]  [facility] -  Office  [phone]

## 2016-10-07 NOTE — Procedures (Signed)
Successful placement of a right sided pleural drainage catheter. Approximately 1.3 L of serous pleural fluid aspirated after catheter placement. No immediate complications.  Ronny Bacon, MD Pager #: [phone]

## 2016-10-07 NOTE — Progress Notes (Signed)
Physical Therapy Treatment Patient Details Name: [name] MRN: [id number] DOB: 1945/12/27 Today's Date: 11-03-16    History of Present Illness 70 y.o. admitted with right malignant pleural effusion. s/p thoracentesis, now plans for PleurX catheter placement.  PMH of Breast cancer, RA, HTN, COPD.     PT Comments    Pt ambulated in hallway and requiring 6L O2 [place] (during exertion) at this time.  Per pt and RN, possible PleurX catheter placement today or tomorrow.  Follow Up Recommendations  Home health PT     Equipment Recommendations  Other (comment) (rollator)    Recommendations for Other Services       Precautions / Restrictions Precautions Precaution Comments: monitor O2 Restrictions Weight Bearing Restrictions: No    Mobility  Bed Mobility Overal bed mobility: Modified Independent                Transfers Overall transfer level: Modified independent                  Ambulation/Gait Ambulation/Gait assistance: Supervision Ambulation Distance (Feet): 160 Feet Assistive device: Rolling walker (2 wheeled) Gait Pattern/deviations: Step-through pattern;Trunk flexed     General Gait Details: verbal cues for posture and RW positioning, 3 short standing rest breaks for pursed lip breathing, SpO2 83% on 3L O2 [place] and improved to 90% on 6L O2   Stairs            Wheelchair Mobility    Modified Rankin (Stroke Patients Only)       Balance                                    Cognition Arousal/Alertness: Awake/alert Behavior During Therapy: WFL for tasks assessed/performed Overall Cognitive Status: Within Functional Limits for tasks assessed                      Exercises      General Comments        Pertinent Vitals/Pain Pain Location: R chest discomfort - not rated Pain Intervention(s): Monitored during session;Limited activity within patient's tolerance    Home Living                      Prior  Function            PT Goals (current goals can now be found in the care plan section) Progress towards PT goals: Progressing toward goals    Frequency    Min 3X/week      PT Plan Current plan remains appropriate    Co-evaluation             End of Session Equipment Utilized During Treatment: Oxygen Activity Tolerance: Patient tolerated treatment well Patient left: in bed;with call bell/phone within reach;with family/visitor present     Time: 7793-9030 PT Time Calculation (min) (ACUTE ONLY): 9 min  Charges:  $Gait Training: 8-22 mins                    G Codes:      Aamirah Salmi,KATHrine E November 03, 2016, 12:15 PM Carmelia Bake, PT, DPT 11/03/16 Pager: [phone]

## 2016-10-08 DIAGNOSIS — C3491 Malignant neoplasm of unspecified part of right bronchus or lung: Secondary | ICD-10-CM | POA: Diagnosis present

## 2016-10-08 DIAGNOSIS — J910 Malignant pleural effusion: Secondary | ICD-10-CM | POA: Diagnosis present

## 2016-10-08 LAB — GLUCOSE, CAPILLARY: Glucose-Capillary: 103 mg/dL — ABNORMAL HIGH (ref 65–99)

## 2016-10-08 MED ORDER — ENSURE ENLIVE PO LIQD: 237.0000 mL | Freq: Two times a day (BID) | ORAL | 12 refills | Status: AC

## 2016-10-08 NOTE — Discharge Summary (Addendum)
Physician Discharge Summary  [name] AUQ:[id number] DOB: Sep 03, 1946 DOA: 09/24/2016  PCP: Merrilee Seashore, MD  Admit date: 09/24/2016 Discharge date: 10/08/2016  Admitted From: Home Disposition: Home  Recommendations for Outpatient Follow-up:  1. Follow up with PCP in 1-2 weeks 2. Follow-up with Dr. Julien Nordmann in 2 weeks. 3. Patient being discharged with right Pleurx catheter for recurrent pleural effusion.Home health nurse will assist patient on drainage of recurrent pleural effusion through the Pleurx catheter. 4. Patient has a thyroid nodule was recently followed upon PET scan.  Home Health: RN and PT Equipment/Devices: Oxygen (2-3 L at rest and 4 L with ambulation continuously).  Discharge Condition: Stable CODE STATUS: Full code Diet recommendation: Regular    Discharge Diagnoses:  Principal Problem:   Acute on chronic respiratory failure with hypoxia (HCC)   Active Problems:   Recurrent right pleural effusion     Adenocarcinoma of right lung, stage 4 (HCC)   Malignant pleural effusion   COPD GOLD II   Lung nodule seen on imaging study   Exudative pleural effusion   Essential hypertension   Leukocytosis   Hypokalemia   Right lower lobe pneumonia (HCC)   Thyroid nodule     Chest pain in adult     Chronic back pain   Brief narrative/history of present illness Please refer to admission H&P for details, in brief, 70 year old female with COPD not on home O2, history of right breast malignancy status post lumpectomy and radiation about 30 has back, hypertension, recently diagnosed rheumatoid arthritis (on weekly methotrexate and daily prednisone) presented to the ED with right-sided chest pain for past 2-3 weeks duration. 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  Principal Problem:   Acute on chronic respiratory failure with hypoxia ([name]) -Secondary to malignant pleural  effusion. Underwent thoracentesis x2.  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 -Given recurrent malignant effusion consulted IR for right place catheter placement which was done on 12/4. Will arrange home health for  drainage of malignant effusion to relieve symptoms. -Patient requiring oxygen both at rest and on ambulation. Will be arranged.  Active Problems: Adenocarcinoma of the lung stage IV As per biopsy. Ordered for molecular studies. Dr. Julien Nordmann following. Plans on outpatient PET scan and further treatment options. MRI brain negative for brain metastases.  Chronic back pain and Bilateral upper chest pain On Percocet at home every 4 hours. Received when necessary IV Toradol while in the hospital. Currently improved. Should follow-up with Dr. Karene Fry as outpatient.    COPD GOLD II  Needs home O2 (with associated malignant effusion).  Incidental left lower thyroid nodule TSH of 0.28. Normal free T3 and T4. Her ultrasound showing solid and cystic nodules . needs annual follow-up.  Right lower lobe pneumonia Possibly postobstructive. Completed antibiotics.  Hypokalemia Replenished  Essential hypertension  Stable.  continue home medications      Family Communication  :  discussed with sister while in the hospital.  Disposition Plan  : Home with [facility]   Consults  :   Pulmonary Oncology IR  Procedures  :  Right thoracentesisx2 Right Pleurx catheter on 12/4.  Discharge Instructions     Medication List    TAKE these medications   albuterol 108 (90 Base) MCG/ACT inhaler Commonly known as:  PROVENTIL HFA;VENTOLIN HFA Inhale 2 puffs into the lungs every 6 (six) hours as needed for wheezing or shortness of breath.   atorvastatin 40 MG tablet Commonly known as:  LIPITOR  Take 40 mg by mouth daily.   feeding supplement (ENSURE ENLIVE) Liqd Take 237 mLs by mouth 2 (two) times daily between meals.   FOLIC ACID PO Take 1 mg by mouth daily.    irbesartan-hydrochlorothiazide 300-12.5 MG tablet Commonly known as:  AVALIDE Take 1 tablet by mouth daily.   methotrexate 2.5 MG tablet Commonly known as:  RHEUMATREX Take 15 mg by mouth once a week. Every Tuesday.   oxyCODONE-acetaminophen 10-325 MG tablet Commonly known as:  PERCOCET Take 1 tablet by mouth every 6 (six) hours as needed for pain. What changed:  when to take this   predniSONE 5 MG tablet Commonly known as:  DELTASONE Take 10 mg by mouth every morning.   pregabalin 200 MG capsule Commonly known as:  LYRICA Take 200 mg by mouth daily.   SYMBICORT 160-4.5 MCG/ACT inhaler Generic drug:  budesonide-formoterol USE 2 PUFFS FIRST THING IN THE MORNING AND 2 PUFFS ABOUT 12 HOURS LATER       No Known Allergies   Procedures/Studies: Dg Chest 1 View  Result Date: 10/01/2016 CLINICAL DATA:  Post right thoracentesis EXAM: CHEST 1 VIEW COMPARISON:  09/28/2016 FINDINGS: Decreasing right pleural effusion following thoracentesis. No pneumothorax. Small right effusion and right base atelectasis persists. Heart is borderline in size. Left lung is clear. IMPRESSION: No pneumothorax following right thoracentesis. Small residual right effusion with right base atelectasis. Electronically Signed   By: Rolm Baptise M.D.   On: 10/01/2016 14:33   Dg Chest 1 View  Result Date: 09/25/2016 CLINICAL DATA:  Status post right thoracentesis. EXAM: CHEST 1 VIEW COMPARISON:  Radiographs of September 24, 2016. FINDINGS: Stable cardiomegaly. Atherosclerosis of thoracic aorta is noted. No pneumothorax is noted. Right pleural effusion appears to have resolved. No acute pulmonary disease is noted. Bony thorax is unremarkable. IMPRESSION: Aortic atherosclerosis. Right pleural effusion has resolved status post thoracentesis. No pneumothorax is noted. Electronically Signed   By: Marijo Conception, M.D.   On: 09/25/2016 16:44   Dg Chest 2 View  Result Date: 10/07/2016 CLINICAL DATA:  Recurrent right  pleural and fusion associated with shortness of breath and chest pain. History of COPD, former smoker, diabetes. Patient also has a history of right-sided breast malignancy. EXAM: CHEST  2 VIEW COMPARISON:  Chest x-rays of October 04, 2016, September 24, 2016, and CT scan of the chest of September 24, 2016. FINDINGS: The left lung is mildly hyperinflated. There is no left-sided pleural effusion. On the right there is a small pleural effusion which has increased slightly in size over the past 3 days. There is no pneumothorax. There is stable ovoid density projecting over the posterior aspect of the right seventh rib and patchy interstitial density more inferiorly. Is calcification in the wall of the aortic arch. The heart and pulmonary vascularity are normal. The bony thorax exhibits no acute abnormality. The heart and pulmonary vascularity are normal. The mediastinum is normal in width. IMPRESSION: Slight interval increase in the volume of the small right pleural effusion. Stable underlying chronic bronchitic changes. Patchy density in the right mid lung is stable. Thoracic aortic atherosclerosis. Electronically Signed   By: David  Martinique M.D.   On: 10/07/2016 09:41   Dg Chest 2 View  Result Date: 10/04/2016 CLINICAL DATA:  Shortness of breath.  Ex-smoker. EXAM: CHEST  2 VIEW COMPARISON:  10/01/2016. FINDINGS: Increasing RIGHT pleural effusion. Compressive atelectasis at the RIGHT base but no definite consolidation. Trace LEFT pleural effusion. No pneumothorax. Normal heart size. IMPRESSION: Beginning re-accumulation of  RIGHT pleural effusion post thoracentesis. Electronically Signed   By: Staci Righter M.D.   On: 10/04/2016 10:28   Dg Chest 2 View  Result Date: 09/24/2016 CLINICAL DATA:  Shortness of breath and chest pain EXAM: CHEST  2 VIEW COMPARISON:  December 05, 2013 FINDINGS: There is a right pleural effusion with right base atelectasis. Lungs elsewhere are clear. Heart size and pulmonary vascularity  are normal. No adenopathy. There is atherosclerotic calcification in the aorta. No bone lesions. IMPRESSION: Right pleural effusion with right base atelectasis. Lungs elsewhere clear. There is aortic atherosclerosis. Electronically Signed   By: Lowella Grip III M.D.   On: 09/24/2016 13:20   Ct Angio Chest Pe W Or Wo Contrast  Result Date: 09/25/2016 CLINICAL DATA:  Subacute onset of right-sided chest pain. Increasing respiratory distress. Leukocytosis and elevated D-dimer. Initial encounter. EXAM: CT ANGIOGRAPHY CHEST WITH CONTRAST TECHNIQUE: Multidetector CT imaging of the chest was performed using the standard protocol during bolus administration of intravenous contrast. Multiplanar CT image reconstructions and MIPs were obtained to evaluate the vascular anatomy. CONTRAST:  100 mL of Isovue 370 IV contrast COMPARISON:  None. FINDINGS: Cardiovascular: There is no evidence of significant pulmonary embolus. Evaluation for pulmonary embolus is suboptimal in areas of airspace consolidation and due to motion artifact. Scattered calcification is noted along the aortic arch and descending thoracic aorta. The heart remains borderline normal in size. Scattered coronary artery calcifications are seen. The great vessels are unremarkable in appearance. Mediastinum/Nodes: Visualized mediastinal nodes remain normal in size. No pericardial effusion is identified. Bilateral hypoattenuating nodules are noted within the thyroid gland, measuring up to 1.7 cm on the left. No axillary lymphadenopathy is seen. The patient is status post right-sided mastectomy. Lungs/Pleura: A moderate right-sided pleural effusion is noted, with partial consolidation at the right lung base. There is a 1.7 x 1.0 cm pleural-based nodule anteriorly at the right middle lobe (image 49 of 95). Malignancy cannot be excluded. No pneumothorax is seen. Upper Abdomen: The visualized portions of the liver and spleen are grossly unremarkable. The visualized  portions of the adrenal glands are within normal limits. Musculoskeletal: No acute osseous abnormalities are identified. An hemangioma is noted at vertebral body T9. The visualized musculature is unremarkable in appearance. Review of the MIP images confirms the above findings. IMPRESSION: 1. No evidence of significant pulmonary embolus. 2. Moderate right-sided pleural effusion, with partial consolidation at the right lung base. This could reflect pneumonia. 3. Apparent 1.7 x 1.0 cm pleural-based nodule anteriorly at the right middle lobe. Malignancy cannot be excluded. PET/CT would be helpful for further evaluation. Alternatively, diagnostic thoracentesis might be considered. 4. **An incidental finding of potential clinical significance has been found. 1.7 cm hypoattenuating nodule at the left thyroid lobe. Consider further evaluation with thyroid ultrasound. If patient is clinically hyperthyroid, consider nuclear medicine thyroid uptake and scan.** 5. Scattered coronary artery calcifications seen. 6. 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 Electronically Signed   By: Garald Balding M.D.   On: 09/25/2016 00:58   Mr Jeri Cos VZ Contrast  Result Date: 10/04/2016 CLINICAL DATA:  New diagnosis of lung cancer. History of breast cancer, hypertension rheumatoid arthritis. Assess for intracranial metastasis. EXAM: MRI HEAD WITHOUT AND WITH CONTRAST TECHNIQUE: Multiplanar, multiecho pulse sequences of the brain and surrounding structures were obtained without and with intravenous contrast. CONTRAST:  98m MULTIHANCE GADOBENATE DIMEGLUMINE 529 MG/ML IV SOLN COMPARISON:  None. FINDINGS: Mild motion degraded examination. INTRACRANIAL CONTENTS: No reduced diffusion to suggest acute ischemia or hypercellular tumor.  No susceptibility artifact to suggest hemorrhage. The ventricles and sulci are normal for patient's age. Patchy supratentorial white matter FLAIR T2 hyperintensities are less than expected for age, compatible with  chronic small vessel ischemic disease. No suspicious parenchymal signal, masses, mass effect. No abnormal intraparenchymal or extra-axial enhancement. No abnormal extra-axial fluid collections. No extra-axial masses. VASCULAR: Normal major intracranial vascular flow voids present at skull base. SKULL AND UPPER CERVICAL SPINE: No abnormal sellar expansion. No suspicious calvarial bone marrow signal. Craniocervical junction maintained. Small RIGHT exostosis. SINUSES/ORBITS: RIGHT maxillary mucosal retention cyst. Small RIGHT mastoid effusion.The included ocular globes and orbital contents are non-suspicious. OTHER: None. IMPRESSION: No intracranial metastasis. Negative motion degraded MRI head with and without contrast for age. Electronically Signed   By: Elon Alas M.D.   On: 10/04/2016 18:35   Ir Guided Niel Hummer W Catheter Placement  Result Date: 10/07/2016 CLINICAL DATA:  History of lung cancer, now with recurrent symptomatic right-sided malignant effusion. Please perform tunneled right-sided pleural drainage catheter for palliative purposes. EXAM: INSERTION OF TUNNELED RIGHT SIDED PLEURAL DRAINAGE CATHETER COMPARISON:  Ultrasound-guided thoracentesis - 10/01/2016; 09/25/2016; chest radiograph - 10/07/2016 MEDICATIONS: Ancef 2 gm IV; Antibiotic was administered in an appropriate time interval for the procedure. ANESTHESIA/SEDATION: Moderate (conscious) sedation was employed during this procedure. A total of Versed 2 mg and Fentanyl 50 mcg was administered intravenously. Moderate Sedation Time: 14 minutes. The patient's level of consciousness and vital signs were monitored continuously by radiology nursing throughout the procedure under my direct supervision. FLUOROSCOPY TIME:  FLUOROSCOPY TIME 36 seconds (15.4 mGy) COMPLICATIONS: None immediate. PROCEDURE: The procedure, risks, benefits, and alternatives were explained to the patient, who wish to proceed with the placement of this permanent pleural catheter  as the patient is seeking palliative care. The patient understand and consent to the procedure. The right inferior lateral chest and upper abdomen were prepped with Chlorhexidine in a sterile fashion, and a sterile drape was applied covering the operative field. A sterile gown and sterile gloves were used for the procedure. Initial ultrasound scanning and fluoroscopic imaging demonstrates a recurrent moderate to large pleural effusion. Under direct ultrasound guidance, the right inferior lateral pleural space was accessed with a Yueh sheath needle after the overlying soft tissues were anesthetized with 1% lidocaine with epinephrine. An Amplatz super stiff wire was then advanced under fluoroscopy into the pleural space. A 15.5 French tunneled Pleur-X catheter was tunneled from an incision within the right upper abdominal quadrant to the access site. The pleural access site was serially dilated under fluoroscopy, ultimately allowing placement of a peel-away sheath. The catheter was advanced through the peel-away sheath. The sheath was then removed. Final catheter positioning was confirmed with a fluoroscopic radiographic image. The access incision was closed with subcutaneous subcuticular 4-0 Vicryl, Dermabond and Steri-Strips. A Prolene retention suture was applied at the catheter exit site. Large volume thoracentesis was performed through the new catheter utilizing provided bulb vacuum assisted drainage bag. The patient tolerated the above procedure well without immediate postprocedural complication. FINDINGS: Preprocedural ultrasound scanning demonstrates a recurrent moderate sized right sided pleural effusion. After ultrasound and fluoroscopic guided placement, the catheter is coiled within the caudal aspect of the right hemi thorax at the location of the patient's recurrent malignant effusion. Following catheter placement, approximately 1.3 L of serous though slightly blood tinged pleural fluid was removed.  IMPRESSION: Successful placement of permanent, tunneled right pleural drainage catheter via lateral approach. Approximately 1.3 L of serous though slightly blood tinged pleural fluid was removed after  catheter placement. Electronically Signed   By: Sandi Mariscal M.D.   On: 10/07/2016 16:42   Dg Chest Port 1 View  Result Date: 09/28/2016 CLINICAL DATA:  Productive cough today EXAM: PORTABLE CHEST 1 VIEW COMPARISON:  09/25/2016 FINDINGS: There is new airspace opacity obscuring the right hemidiaphragm. Atherosclerotic calcification of the aortic arch. The left lung appears clear.  Upper normal heart size. IMPRESSION: 1. Obscuration of the right hemidiaphragm with some blunting of the right lateral costophrenic angle. This could reflect recurrent right pleural effusion with passive atelectasis although right basilar pneumonia is not readily excluded. 2. Atherosclerotic aortic arch. Electronically Signed   By: Van Clines M.D.   On: 09/28/2016 10:26   US Thyroid  Result Date: 09/27/2016 CLINICAL DATA:  Incidental on CT.  Thyroid nodule seen on CT. EXAM: THYROID ULTRASOUND TECHNIQUE: Ultrasound examination of the thyroid gland and adjacent soft tissues was performed. COMPARISON:  Chest CT 09/24/2016 FINDINGS: Parenchymal Echotexture: Mildly heterogenous Estimated total number of nodules >/= 1 cm: 3 Number of spongiform nodules >/=  2 cm not described below (TR1): 0 Number of mixed cystic and solid nodules >/= 1.5 cm not described below (TR2): 0 _________________________________________________________ Isthmus: Measures 0.4 cm in thickness. No discrete nodules are identified within the thyroid isthmus. _________________________________________________________ Right lobe: Measures 4.2 x 1.6 x 2.0 cm. Nodule # 1: Location: Isthmus; Superior Size: 1.6 x 1.3 x 1.4 cm. Composition: solid/almost completely solid (2), there is an eccentric cystic component. Echogenicity: isoechoic (1) Shape: not taller-than-wide  (0) Margins: ill-defined (0) Echogenic foci: none (0) ACR TI-RADS total points: 3. ACR TI-RADS risk category: TR3 (3 points). ACR TI-RADS recommendations: *Given size (>/= 1.5 - 2.4 cm) and appearance, a follow-up ultrasound in 1 year should be considered based on TI-RADS criteria. Nodule # 2: Location: Right; Inferior Size: 0.6 x 0.4 x 0.5 cm Composition: solid/almost completely solid (2) Echogenicity: hypoechoic (2) Shape: not taller-than-wide (0) Margins: ill-defined (0) Echogenic foci: none (0) ACR TI-RADS total points: 4. ACR TI-RADS risk category: TR4 (4-6 points). ACR TI-RADS recommendations: Given size (<0.9 cm) and appearance, this nodule does NOT meet TI-RADS criteria for biopsy or dedicated follow-up. _________________________________________________________ Left lobe: Measures 4.1 x 1.6 x 1.9 cm. Nodule # 3: Location: Left; Superior Size: 1.0 x 1.0 x 1.0 cm. Composition: mixed cystic and solid (1) Echogenicity: hypoechoic (2) Shape: not taller-than-wide (0) Margins: smooth (0) Echogenic foci: none (0) ACR TI-RADS total points: 3. ACR TI-RADS risk category: TR3 (3 points). ACR TI-RADS recommendations: Given size (<1.4 cm) and appearance, this nodule does NOT meet TI-RADS criteria for biopsy or dedicated follow-up. There is a cystic structure in the superior left thyroid lobe measuring 1.0 x 0.7 x 0.7 cm. IMPRESSION: Solid and cystic nodules in the thyroid. Largest nodule measures 1.6 cm in the superior right lobe. This nodule meets criteria for 1 year follow-up. The above is in keeping with the ACR TI-RADS recommendations - J Am Coll Radiol 2017;14:587-595. Electronically Signed   By: Markus Daft M.D.   On: 09/27/2016 07:59   US Thoracentesis Asp Pleural Space W/img Guide  Result Date: 10/01/2016 INDICATION: Recurrent right pleural effusion of unknown etiology. Request for diagnostic and therapeutic thoracentesis. EXAM: ULTRASOUND GUIDED DIAGNOSTIC AND THERAPEUTIC THORACENTESIS MEDICATIONS: 1%  Lidocaine COMPLICATIONS: None immediate. PROCEDURE: An ultrasound guided thoracentesis was thoroughly discussed with the patient and questions answered. The benefits, risks, alternatives and complications were also discussed. The patient understands and wishes to proceed with the procedure. Written consent was obtained. Ultrasound was performed  to localize and mark an adequate pocket of fluid in the right chest. The area was then prepped and draped in the normal sterile fashion. 1% Lidocaine was used for local anesthesia. Under ultrasound guidance a Safe-T-Centesis catheter was introduced. Thoracentesis was performed. The catheter was removed and a dressing applied. FINDINGS: A total of approximately 1.2L of serosanguineous fluid was removed. Samples were sent to the laboratory as requested by the clinical team. IMPRESSION: Successful ultrasound guided right thoracentesis yielding 1.2L of pleural fluid. Read by: Saverio Danker, PA-C Electronically Signed   By: Lucrezia Europe M.D.   On: 10/01/2016 14:31   US Thoracentesis Asp Pleural Space W/img Guide  Result Date: 09/25/2016 INDICATION: Ex-smoker with remote history of breast cancer, COPD, rheumatoid arthritis, dyspnea, pneumonia, right pleural effusion. Request made for diagnostic and therapeutic right thoracentesis. EXAM: ULTRASOUND GUIDED DIAGNOSTIC AND THERAPEUTIC RIGHT THORACENTESIS MEDICATIONS: None. COMPLICATIONS: None immediate. PROCEDURE: An ultrasound guided thoracentesis was thoroughly discussed with the patient and questions answered. The benefits, risks, alternatives and complications were also discussed. The patient understands and wishes to proceed with the procedure. Written consent was obtained. Ultrasound was performed to localize and mark an adequate pocket of fluid in the right chest. The area was then prepped and draped in the normal sterile fashion. 1% Lidocaine was used for local anesthesia. Under ultrasound guidance a Safe-T-Centesis catheter  was introduced. Thoracentesis was performed. The catheter was removed and a dressing applied. FINDINGS: A total of approximately 1.5 liters of hazy, amber fluid was removed. Samples were sent to the laboratory as requested by the clinical team. IMPRESSION: Successful ultrasound guided diagnostic and therapeutic right thoracentesis yielding 1.5 liters of pleural fluid. Read by: Rowe Robert, PA-C Electronically Signed   By: Marybelle Killings M.D.   On: 09/25/2016 16:15       Subjective: Breathing slightly better today. Pain is also stable.  Discharge Exam: Vitals:   10/07/16 2116 10/08/16 0447  BP: (!) 124/58 114/61  Pulse: 65 94  Resp:  20  Temp: 97.9 F (36.6 C) 98.2 F (36.8 C)   Vitals:   10/07/16 1953 10/07/16 2116 10/08/16 0447 10/08/16 0815  BP:  (!) 124/58 114/61   Pulse:  65 94   Resp:   20   Temp:  97.9 F (36.6 C) 98.2 F (36.8 C)   TempSrc:  Oral Oral   SpO2: 94% 97% 98% (!) 89%  Weight:      Height:        Gen: not in distress HEENT:  moist mucosa, supple neck Chest: Improved breath sounds over right lung, right Pleurx catheter. CVS: N S1&S2, no murmurs,  GI: soft, NT, ND, Musculoskeletal: warm, no edema    The results of significant diagnostics from this hospitalization (including imaging, microbiology, ancillary and laboratory) are listed below for reference.     Microbiology: No results found for this or any previous visit (from the past 240 hour(s)).   Labs: BNP (last 3 results) No results for input(s): BNP in the last 8760 hours. Basic Metabolic Panel:  Recent Labs Lab 10/02/16 0512  NA 141  K 3.2*  CL 102  CO2 32  GLUCOSE 107*  BUN 12  CREATININE 0.73  CALCIUM 8.6*   Liver Function Tests: No results for input(s): AST, ALT, ALKPHOS, BILITOT, PROT, ALBUMIN in the last 168 hours. No results for input(s): LIPASE, AMYLASE in the last 168 hours. No results for input(s): AMMONIA in the last 168 hours. CBC:  Recent Labs Lab 10/02/16 0512  10/03/16  1950 10/04/16 0425  WBC 10.4 10.5 11.5*  NEUTROABS  --   --  7.5  HGB 10.7* 10.6* 10.5*  HCT 35.4* 34.4* 34.2*  MCV 92.2 92.0 91.7  PLT 449* 420* 446*   Cardiac Enzymes: No results for input(s): CKTOTAL, CKMB, CKMBINDEX, TROPONINI in the last 168 hours. BNP: Invalid input(s): POCBNP CBG:  Recent Labs Lab 10/04/16 0744 10/05/16 0810 10/06/16 0737 10/07/16 0810 10/08/16 0801  GLUCAP 110* 103* 109* 87 103*   D-Dimer No results for input(s): DDIMER in the last 72 hours. Hgb A1c No results for input(s): HGBA1C in the last 72 hours. Lipid Profile No results for input(s): CHOL, HDL, LDLCALC, TRIG, CHOLHDL, LDLDIRECT in the last 72 hours. Thyroid function studies No results for input(s): TSH, T4TOTAL, T3FREE, THYROIDAB in the last 72 hours.  Invalid input(s): FREET3 Anemia work up No results for input(s): VITAMINB12, FOLATE, FERRITIN, TIBC, IRON, RETICCTPCT in the last 72 hours. Urinalysis    Component Value Date/Time   COLORURINE YELLOW 04/30/2011 1340   APPEARANCEUR CLEAR 04/30/2011 1340   LABSPEC 1.014 04/30/2011 1340   PHURINE 6.5 04/30/2011 1340   GLUCOSEU NEGATIVE 04/30/2011 1340   HGBUR NEGATIVE 04/30/2011 1340   BILIRUBINUR NEGATIVE 04/30/2011 1340   KETONESUR NEGATIVE 04/30/2011 1340   PROTEINUR NEGATIVE 04/30/2011 1340   UROBILINOGEN 0.2 04/30/2011 1340   NITRITE NEGATIVE 04/30/2011 1340   LEUKOCYTESUR NEGATIVE 04/30/2011 1340   Sepsis Labs Invalid input(s): PROCALCITONIN,  WBC,  LACTICIDVEN Microbiology No results found for this or any previous visit (from the past 240 hour(s)).   Time coordinating discharge: Over 30 minutes  SIGNED:   Louellen Molder, MD  [facility] 10/08/2016, 11:50 AM Pager   If 7PM-7AM, please contact night-coverage [email or web address] Password [email or web address]

## 2016-10-08 NOTE — Care Management Important Message (Signed)
Important Message  Patient Details  Name: [name] MRN: [id number] Date of Birth: 04-10-1946   Medicare Important Message Given:  Yes    Camillo Flaming 10/08/2016, 10:08 AMImportant Message  Patient Details  Name: [name] MRN: [id number] Date of Birth: Dec 28, 1945   Medicare Important Message Given:  Yes    Camillo Flaming 10/08/2016, 10:08 AM

## 2016-10-08 NOTE — Progress Notes (Signed)
Went over all discharge paperwork with patient and family.  Pleurex teaching done with patient and family with teachback.  Patient and family stated they feel comfortable changing pleurex drain.  Discharge paperwork and drain supplies given to patient.  Pt will be discharged via wheelchair once [facility] deliver home oxygen.

## 2016-10-08 NOTE — Progress Notes (Signed)
SATURATION QUALIFICATIONS: (This note is used to comply with regulatory documentation for home oxygen)  Patient Saturations on Room Air at Rest = 83%  Patient Saturations on Room Air while Ambulating = 83%  Patient Saturations on 4 Liters of oxygen while Ambulating = 93%  Please briefly explain why patient needs home oxygen:

## 2016-10-09 ENCOUNTER — Encounter: Payer: Self-pay | Admitting: *Deleted

## 2016-10-09 DIAGNOSIS — C3491 Malignant neoplasm of unspecified part of right bronchus or lung: Secondary | ICD-10-CM

## 2016-10-09 NOTE — Progress Notes (Signed)
Oncology Nurse Navigator Documentation  Oncology Nurse Navigator Flowsheets 10/09/2016  Navigator Location [facility]  Navigator Encounter Type Other/[facility] updated me that foundation one and PDL 1 was sent on 10/04/16.  Patient Visit Type Other  Treatment Phase Pre-Tx/Tx Discussion  Barriers/Navigation Needs Coordination of Care  Interventions Coordination of Care  Coordination of Care Other  Acuity Level 1  Time Spent with Patient 15

## 2016-10-09 NOTE — Progress Notes (Signed)
Oncology Nurse Navigator Documentation  Oncology Nurse Navigator Flowsheets 10/09/2016  Navigator Location [facility]  Navigator Encounter Type Other/Ms. Brandes is out of the hospital.  Per Dr. Julien Nordmann I ordered PET.  I will update pre cert coordinator to expedite authorization.  I also contacted [facility] to see when tissue was sent for molecular and PDL 1 studies.   Patient Visit Type Other  Treatment Phase Pre-Tx/Tx Discussion  Barriers/Navigation Needs Coordination of Care  Interventions Coordination of Care  Coordination of Care Appts;Radiology  Acuity Level 2  Time Spent with Patient 30

## 2016-10-10 ENCOUNTER — Telehealth: Payer: Self-pay | Admitting: *Deleted

## 2016-10-10 ENCOUNTER — Ambulatory Visit (HOSPITAL_COMMUNITY)
Admission: RE | Admit: 2016-10-10 | Discharge: 2016-10-10 | Disposition: A | Discharge status: Home / Self Care | Payer: Medicare Other | Source: Ambulatory Visit | Attending: Interventional Radiology | Admitting: Interventional Radiology

## 2016-10-10 ENCOUNTER — Encounter (HOSPITAL_COMMUNITY): Payer: Self-pay | Admitting: Interventional Radiology

## 2016-10-10 ENCOUNTER — Other Ambulatory Visit (HOSPITAL_COMMUNITY): Payer: Self-pay | Admitting: Interventional Radiology

## 2016-10-10 ENCOUNTER — Encounter (HOSPITAL_COMMUNITY): Payer: Self-pay

## 2016-10-10 ENCOUNTER — Encounter: Payer: Self-pay | Admitting: *Deleted

## 2016-10-10 ENCOUNTER — Telehealth: Payer: Self-pay | Admitting: Medical Oncology

## 2016-10-10 DIAGNOSIS — Z85118 Personal history of other malignant neoplasm of bronchus and lung: Secondary | ICD-10-CM

## 2016-10-10 DIAGNOSIS — T85618A Breakdown (mechanical) of other specified internal prosthetic devices, implants and grafts, initial encounter: Secondary | ICD-10-CM | POA: Diagnosis present

## 2016-10-10 DIAGNOSIS — J910 Malignant pleural effusion: Secondary | ICD-10-CM | POA: Insufficient documentation

## 2016-10-10 DIAGNOSIS — Y743 Surgical instruments, materials and general hospital and personal-use devices (including sutures) associated with adverse incidents: Secondary | ICD-10-CM | POA: Insufficient documentation

## 2016-10-10 DIAGNOSIS — C801 Malignant (primary) neoplasm, unspecified: Secondary | ICD-10-CM | POA: Insufficient documentation

## 2016-10-10 HISTORY — PX: IR GENERIC HISTORICAL: IMG1180011

## 2016-10-10 MED ORDER — LIDOCAINE HCL 1 % IJ SOLN
INTRAMUSCULAR | Status: AC
  Filled 2016-10-10: qty 20

## 2016-10-10 MED ORDER — LIDOCAINE HCL 1 % IJ SOLN
INTRAMUSCULAR | Status: DC | PRN
  Administered 2016-10-10: 5 mL

## 2016-10-10 MED ORDER — IOPAMIDOL (ISOVUE-300) INJECTION 61%
INTRAVENOUS | Status: AC
  Administered 2016-10-10: 15 mL
  Filled 2016-10-10: qty 50

## 2016-10-10 NOTE — Telephone Encounter (Signed)
Oncology Nurse Navigator Documentation  Oncology Nurse Navigator Flowsheets 10/10/2016  Navigator Location [facility]  Navigator Encounter Type Telephone/I called central scheduling to schedule Ms. Igo appt for PET scan.  Her family stated she went back to the hospital for help and will be back later.  I asked that patient call me tomorrow and I can update her on scan and appt with Dr. Julien Nordmann   Telephone Outgoing Call  Treatment Phase Pre-Tx/Tx Discussion  Barriers/Navigation Needs Coordination of Care  Interventions Coordination of Care  Coordination of Care Appts;Radiology  Acuity Level 2  Time Spent with Patient 30

## 2016-10-10 NOTE — Procedures (Signed)
Interventional Radiology Procedure Note  Patient received a right pleurex catheter 10/07/2016 for recurrent right pleural effusion, which had been drained x2.    She arrives today complaining of significant drainage around the pleurex tube at the skin.  Current dressing is saturated.  Only 60cc drained yesterday.   Procedure: Fluoro check of indwelling tube, with attempted reposition.  Indwelling tube was damaged, and exchange was attempted with failed exchange.    Findings:  Indwelling tube with significant drainage at skin.  No significant pleural fluid.  Failed exchange.  Complications: None Recommendations:  - Routine dressing changes - Schedule for VIR appointment next week (<7days) with CXR, to determine need for new pleurex placement. - OK for DC.   Signed,  Dulcy Fanny. Earleen Newport, DO

## 2016-10-10 NOTE — Telephone Encounter (Signed)
Pleurix catheter not draining-fluid leaking around catheter. Pt going to IR for evaluation

## 2016-10-10 NOTE — Progress Notes (Signed)
Oncology Nurse Navigator Documentation  Oncology Nurse Navigator Flowsheets 10/10/2016  Navigator Location [facility]  Navigator Encounter Type Other/I called insurance to get authorization for her PET scan.  Scan was approved.  I called central scheduling to get PET scan scheduled.  I left vm message with my name and phone number to call.   Treatment Phase Pre-Tx/Tx Discussion  Barriers/Navigation Needs Coordination of Care  Interventions Coordination of Care  Coordination of Care Appts;Radiology  Acuity Level 2  Time Spent with Patient 45

## 2016-10-11 ENCOUNTER — Other Ambulatory Visit (HOSPITAL_COMMUNITY): Payer: Medicare Other

## 2016-10-11 ENCOUNTER — Telehealth: Payer: Self-pay | Admitting: *Deleted

## 2016-10-11 DIAGNOSIS — C3491 Malignant neoplasm of unspecified part of right bronchus or lung: Secondary | ICD-10-CM

## 2016-10-11 NOTE — Telephone Encounter (Signed)
Oncology Nurse Navigator Documentation  Oncology Nurse Navigator Flowsheets 10/11/2016  Navigator Location [facility]  Navigator Encounter Type Telephone/I received a call from [place], home health RN.  She states patient can not make PET in [place] next week.  I called central scheduling and changed appt.  I called Ms. Marks gave her appt change and pre-procedure instructions.  I also gave her a follow up at [facility] on 10/24/16.  She was thankful for the call.   Telephone Outgoing Call  Treatment Phase Pre-Tx/Tx Discussion  Barriers/Navigation Needs Coordination of Care  Interventions Coordination of Care  Coordination of Care Appts;Radiology  Acuity Level 2  Acuity Level 2 Assistance expediting appointments;Other  Time Spent with Patient 45

## 2016-10-14 ENCOUNTER — Telehealth: Payer: Self-pay | Admitting: Medical Oncology

## 2016-10-14 ENCOUNTER — Emergency Department (HOSPITAL_COMMUNITY): Payer: Medicare Other

## 2016-10-14 ENCOUNTER — Other Ambulatory Visit: Payer: Self-pay | Admitting: Interventional Radiology

## 2016-10-14 ENCOUNTER — Inpatient Hospital Stay (HOSPITAL_COMMUNITY)
Admission: EM | Admit: 2016-10-14 | Discharge: 2016-10-17 | DRG: 180 | Disposition: A | Discharge status: Home Health | Payer: Medicare Other | Attending: Internal Medicine | Admitting: Internal Medicine

## 2016-10-14 ENCOUNTER — Encounter (HOSPITAL_COMMUNITY): Payer: Self-pay

## 2016-10-14 DIAGNOSIS — M069 Rheumatoid arthritis, unspecified: Secondary | ICD-10-CM | POA: Diagnosis present

## 2016-10-14 DIAGNOSIS — G8929 Other chronic pain: Secondary | ICD-10-CM | POA: Diagnosis present

## 2016-10-14 DIAGNOSIS — J441 Chronic obstructive pulmonary disease with (acute) exacerbation: Secondary | ICD-10-CM | POA: Diagnosis present

## 2016-10-14 DIAGNOSIS — J910 Malignant pleural effusion: Secondary | ICD-10-CM | POA: Diagnosis present

## 2016-10-14 DIAGNOSIS — T502X5A Adverse effect of carbonic-anhydrase inhibitors, benzothiadiazides and other diuretics, initial encounter: Secondary | ICD-10-CM | POA: Diagnosis present

## 2016-10-14 DIAGNOSIS — I10 Essential (primary) hypertension: Secondary | ICD-10-CM | POA: Diagnosis present

## 2016-10-14 DIAGNOSIS — C3490 Malignant neoplasm of unspecified part of unspecified bronchus or lung: Secondary | ICD-10-CM

## 2016-10-14 DIAGNOSIS — Z7952 Long term (current) use of systemic steroids: Secondary | ICD-10-CM | POA: Diagnosis not present

## 2016-10-14 DIAGNOSIS — M549 Dorsalgia, unspecified: Secondary | ICD-10-CM | POA: Diagnosis present

## 2016-10-14 DIAGNOSIS — Z90710 Acquired absence of both cervix and uterus: Secondary | ICD-10-CM

## 2016-10-14 DIAGNOSIS — E785 Hyperlipidemia, unspecified: Secondary | ICD-10-CM | POA: Diagnosis present

## 2016-10-14 DIAGNOSIS — Z923 Personal history of irradiation: Secondary | ICD-10-CM

## 2016-10-14 DIAGNOSIS — R0602 Shortness of breath: Secondary | ICD-10-CM

## 2016-10-14 DIAGNOSIS — I472 Ventricular tachycardia: Secondary | ICD-10-CM | POA: Diagnosis present

## 2016-10-14 DIAGNOSIS — Z801 Family history of malignant neoplasm of trachea, bronchus and lung: Secondary | ICD-10-CM

## 2016-10-14 DIAGNOSIS — J449 Chronic obstructive pulmonary disease, unspecified: Secondary | ICD-10-CM | POA: Diagnosis present

## 2016-10-14 DIAGNOSIS — Z853 Personal history of malignant neoplasm of breast: Secondary | ICD-10-CM | POA: Diagnosis not present

## 2016-10-14 DIAGNOSIS — Z79899 Other long term (current) drug therapy: Secondary | ICD-10-CM

## 2016-10-14 DIAGNOSIS — Z87891 Personal history of nicotine dependence: Secondary | ICD-10-CM

## 2016-10-14 DIAGNOSIS — C3491 Malignant neoplasm of unspecified part of right bronchus or lung: Secondary | ICD-10-CM | POA: Diagnosis present

## 2016-10-14 DIAGNOSIS — Z9981 Dependence on supplemental oxygen: Secondary | ICD-10-CM

## 2016-10-14 DIAGNOSIS — E876 Hypokalemia: Secondary | ICD-10-CM | POA: Diagnosis present

## 2016-10-14 DIAGNOSIS — Z803 Family history of malignant neoplasm of breast: Secondary | ICD-10-CM | POA: Diagnosis not present

## 2016-10-14 DIAGNOSIS — Z7951 Long term (current) use of inhaled steroids: Secondary | ICD-10-CM | POA: Diagnosis not present

## 2016-10-14 DIAGNOSIS — J9621 Acute and chronic respiratory failure with hypoxia: Secondary | ICD-10-CM | POA: Diagnosis present

## 2016-10-14 DIAGNOSIS — J90 Pleural effusion, not elsewhere classified: Secondary | ICD-10-CM | POA: Diagnosis present

## 2016-10-14 HISTORY — DX: Dependence on supplemental oxygen: Z99.81

## 2016-10-14 LAB — BASIC METABOLIC PANEL
Anion gap: 10 (ref 5–15)
BUN: 6 mg/dL (ref 6–20)
CHLORIDE: 97 mmol/L — AB (ref 101–111)
CO2: 32 mmol/L (ref 22–32)
Calcium: 9.7 mg/dL (ref 8.9–10.3)
Creatinine, Ser: 0.56 mg/dL (ref 0.44–1.00)
GFR calc Af Amer: >60 mL/min (ref >60)
GFR calc non Af Amer: >60 mL/min (ref >60)
Glucose, Bld: 148 mg/dL — ABNORMAL HIGH (ref 65–99)
POTASSIUM: 3.4 mmol/L — AB (ref 3.5–5.1)
Sodium: 139 mmol/L (ref 135–145)

## 2016-10-14 LAB — CBC WITH DIFFERENTIAL/PLATELET
Basophils Absolute: 0 10*3/uL (ref 0.0–0.1)
Basophils Relative: 0 %
Eosinophils Absolute: 0.1 10*3/uL (ref 0.0–0.7)
Eosinophils Relative: 1 %
HEMATOCRIT: 35.5 % — AB (ref 36.0–46.0)
HEMOGLOBIN: 10.6 g/dL — AB (ref 12.0–15.0)
LYMPHS ABS: 1 10*3/uL (ref 0.7–4.0)
LYMPHS PCT: 9 %
MCH: 26.6 pg (ref 26.0–34.0)
MCHC: 29.9 g/dL — AB (ref 30.0–36.0)
MCV: 89.2 fL (ref 78.0–100.0)
MONOS PCT: 6 %
Monocytes Absolute: 0.6 10*3/uL (ref 0.1–1.0)
NEUTROS PCT: 84 %
Neutro Abs: 9.2 10*3/uL — ABNORMAL HIGH (ref 1.7–7.7)
Platelets: 374 10*3/uL (ref 150–400)
RBC: 3.98 MIL/uL (ref 3.87–5.11)
RDW: 15.5 % (ref 11.5–15.5)
WBC: 10.9 10*3/uL — AB (ref 4.0–10.5)

## 2016-10-14 MED ORDER — ALBUTEROL (5 MG/ML) CONTINUOUS INHALATION SOLN
10.0000 mg/h | INHALATION_SOLUTION | Freq: Once | RESPIRATORY_TRACT | Status: AC
  Administered 2016-10-14: 10 mg/h via RESPIRATORY_TRACT
  Filled 2016-10-14: qty 20

## 2016-10-14 MED ORDER — OXYCODONE-ACETAMINOPHEN 5-325 MG PO TABS
1.0000 | ORAL_TABLET | Freq: Four times a day (QID) | ORAL | Status: DC | PRN
Start: 2016-10-14 — End: 2016-10-17
  Administered 2016-10-14 – 2016-10-17 (×8): 1 via ORAL
  Filled 2016-10-14 (×8): qty 1

## 2016-10-14 MED ORDER — SODIUM CHLORIDE 0.9% FLUSH: 3.0000 mL | INTRAVENOUS | Status: DC | PRN

## 2016-10-14 MED ORDER — MORPHINE SULFATE (PF) 4 MG/ML IV SOLN
4.0000 mg | Freq: Once | INTRAVENOUS | Status: AC
  Administered 2016-10-14: 4 mg via INTRAVENOUS
  Filled 2016-10-14: qty 1

## 2016-10-14 MED ORDER — PREGABALIN 75 MG PO CAPS
200.0000 mg | ORAL_CAPSULE | Freq: Every day | ORAL | Status: DC
  Administered 2016-10-15 – 2016-10-17 (×3): 200 mg via ORAL
  Filled 2016-10-14: qty 2
  Filled 2016-10-14: qty 1
  Filled 2016-10-14: qty 4

## 2016-10-14 MED ORDER — POTASSIUM CHLORIDE CRYS ER 20 MEQ PO TBCR
40.0000 meq | EXTENDED_RELEASE_TABLET | Freq: Once | ORAL | Status: AC
  Administered 2016-10-15: 40 meq via ORAL
  Filled 2016-10-14: qty 2

## 2016-10-14 MED ORDER — IPRATROPIUM BROMIDE 0.02 % IN SOLN
0.5000 mg | Freq: Four times a day (QID) | RESPIRATORY_TRACT | Status: DC
  Filled 2016-10-14: qty 2.5

## 2016-10-14 MED ORDER — MOMETASONE FURO-FORMOTEROL FUM 200-5 MCG/ACT IN AERO
2.0000 | INHALATION_SPRAY | Freq: Two times a day (BID) | RESPIRATORY_TRACT | Status: DC
  Administered 2016-10-15 (×2): 2 via RESPIRATORY_TRACT
  Filled 2016-10-14: qty 8.8

## 2016-10-14 MED ORDER — SODIUM CHLORIDE 0.9 % IV SOLN
INTRAVENOUS | Status: DC
  Administered 2016-10-14: 18:00:00 via INTRAVENOUS

## 2016-10-14 MED ORDER — ALBUTEROL SULFATE (2.5 MG/3ML) 0.083% IN NEBU: 2.5000 mg | INHALATION_SOLUTION | RESPIRATORY_TRACT | Status: DC | PRN

## 2016-10-14 MED ORDER — KETOROLAC TROMETHAMINE 15 MG/ML IJ SOLN
15.0000 mg | Freq: Four times a day (QID) | INTRAMUSCULAR | Status: DC | PRN
  Administered 2016-10-15 – 2016-10-17 (×4): 15 mg via INTRAVENOUS
  Filled 2016-10-14 (×5): qty 1

## 2016-10-14 MED ORDER — OXYCODONE-ACETAMINOPHEN 10-325 MG PO TABS: 1.0000 | ORAL_TABLET | Freq: Four times a day (QID) | ORAL | Status: DC | PRN

## 2016-10-14 MED ORDER — ACETAMINOPHEN 325 MG PO TABS: 650.0000 mg | ORAL_TABLET | Freq: Four times a day (QID) | ORAL | Status: DC | PRN

## 2016-10-14 MED ORDER — OXYCODONE HCL 5 MG PO TABS
5.0000 mg | ORAL_TABLET | Freq: Four times a day (QID) | ORAL | Status: DC | PRN
Start: 2016-10-14 — End: 2016-10-17
  Administered 2016-10-14 – 2016-10-17 (×7): 5 mg via ORAL
  Filled 2016-10-14 (×7): qty 1

## 2016-10-14 MED ORDER — IOPAMIDOL (ISOVUE-370) INJECTION 76%
INTRAVENOUS | Status: AC
  Filled 2016-10-14: qty 100

## 2016-10-14 MED ORDER — ATORVASTATIN CALCIUM 40 MG PO TABS
40.0000 mg | ORAL_TABLET | Freq: Every day | ORAL | Status: DC
  Administered 2016-10-15 – 2016-10-17 (×3): 40 mg via ORAL
  Filled 2016-10-14 (×3): qty 1

## 2016-10-14 MED ORDER — METHYLPREDNISOLONE SODIUM SUCC 125 MG IJ SOLR
125.0000 mg | Freq: Once | INTRAMUSCULAR | Status: AC
  Administered 2016-10-14: 125 mg via INTRAVENOUS
  Filled 2016-10-14: qty 2

## 2016-10-14 MED ORDER — ONDANSETRON HCL 4 MG/2ML IJ SOLN: 4.0000 mg | Freq: Four times a day (QID) | INTRAMUSCULAR | Status: DC | PRN

## 2016-10-14 MED ORDER — SODIUM CHLORIDE 0.9% FLUSH
3.0000 mL | Freq: Two times a day (BID) | INTRAVENOUS | Status: DC
  Administered 2016-10-15 – 2016-10-17 (×4): 3 mL via INTRAVENOUS

## 2016-10-14 MED ORDER — PREDNISONE 5 MG PO TABS
10.0000 mg | ORAL_TABLET | Freq: Every morning | ORAL | Status: DC
  Administered 2016-10-15 – 2016-10-17 (×3): 10 mg via ORAL
  Filled 2016-10-14: qty 2
  Filled 2016-10-14 (×2): qty 1

## 2016-10-14 MED ORDER — IOPAMIDOL (ISOVUE-370) INJECTION 76%
100.0000 mL | Freq: Once | INTRAVENOUS | Status: AC | PRN
  Administered 2016-10-14: 70 mL via INTRAVENOUS

## 2016-10-14 MED ORDER — SODIUM CHLORIDE 0.9 % IV SOLN: 250.0000 mL | INTRAVENOUS | Status: DC | PRN

## 2016-10-14 MED ORDER — GUAIFENESIN ER 600 MG PO TB12
600.0000 mg | ORAL_TABLET | Freq: Two times a day (BID) | ORAL | Status: DC
  Administered 2016-10-15 – 2016-10-17 (×6): 600 mg via ORAL
  Filled 2016-10-14 (×6): qty 1

## 2016-10-14 MED ORDER — IPRATROPIUM BROMIDE 0.02 % IN SOLN
0.5000 mg | Freq: Once | RESPIRATORY_TRACT | Status: AC
  Administered 2016-10-14: 0.5 mg via RESPIRATORY_TRACT
  Filled 2016-10-14: qty 2.5

## 2016-10-14 MED ORDER — SODIUM CHLORIDE 0.9% FLUSH
3.0000 mL | Freq: Two times a day (BID) | INTRAVENOUS | Status: DC
  Administered 2016-10-15: 3 mL via INTRAVENOUS

## 2016-10-14 MED ORDER — ACETAMINOPHEN 650 MG RE SUPP: 650.0000 mg | Freq: Four times a day (QID) | RECTAL | Status: DC | PRN

## 2016-10-14 MED ORDER — HYDROCODONE-ACETAMINOPHEN 5-325 MG PO TABS
1.0000 | ORAL_TABLET | ORAL | Status: DC | PRN
  Administered 2016-10-15 – 2016-10-16 (×5): 1 via ORAL
  Administered 2016-10-17: 2 via ORAL
  Filled 2016-10-14 (×5): qty 1
  Filled 2016-10-14: qty 2

## 2016-10-14 MED ORDER — ONDANSETRON HCL 4 MG PO TABS: 4.0000 mg | ORAL_TABLET | Freq: Four times a day (QID) | ORAL | Status: DC | PRN

## 2016-10-14 MED ORDER — SODIUM CHLORIDE 0.9 % IJ SOLN
INTRAMUSCULAR | Status: AC
  Filled 2016-10-14: qty 50

## 2016-10-14 NOTE — H&P (Signed)
Bobbi Yount QQP:[id number] DOB: [birth year] DOA: 10/14/2016     PCP: Merrilee Seashore, MD   Outpatient Specialists: [facility] almond allergy Dr. Earlie Server oncology Patient coming from:  home Lives   With family   Chief Complaint: Worsening shortness of breath  HPI: [name] is a 70 y.o. female with medical history significant of lung cancer recurrent pleural effusion, COPD on home oxygen up to 3 L history of right breast malignancy status post lumpectomy and radiation about 30 has back, hypertension, recently diagnosed rheumatoid arthritis (on weekly methotrexate and daily prednisone)     Presented with severe shortness of breath cannot catch her breath very diminished breath sounds setting owning a 92% on 4 L patient was instructed to present to emergency department she had not had any leg swelling . She is concerned that she has reaccumulated fluid. She dies any fever just has occasional chills. Reports sharp chest pain. Can not lay down flat.   Patient received a right pleurex catheter 10/07/2016 for recurrent right pleural effusion, which had been drained x2. We'll days ago patient had chest tube removed secondary to to being damaged well been attempted to reposition secondary to significant drainage around the tube she was seen by IR was apparently unable to exchange her tube with plan to discharge her to home and continue to monitor if needs new Peridex placement Regarding pertinent Chronic problems: Patient was admitted on 21st of November and discharged on 6 of December she was found to have acute respiratory failure hypoxia was found to have malignant pleural effusion and underwent thoracentesis cytology showed adenocarcinoma likely allow him being primary IR at that time Place catheter which was done on December 4 Dr. Earlie Server supposed follow-up patient she had a PET scan ordered as an outpatient at that time MRI of the brain was negative for brain metastases she was discharged home on a  new home oxygen given pleural effusion   IN ER:  No data recorded.    RR 23 Hr 111 Following Medications were ordered in ER: Medications  0.9 %  sodium chloride infusion ( Intravenous New Bag/Given 10/14/16 1749)  sodium chloride 0.9 % injection (not administered)  iopamidol (ISOVUE-370) 76 % injection (not administered)  oxyCODONE-acetaminophen (PERCOCET/ROXICET) 5-325 MG per tablet 1 tablet (1 tablet Oral Given 10/14/16 2045)    And  oxyCODONE (Oxy IR/ROXICODONE) immediate release tablet 5 mg (5 mg Oral Given 10/14/16 2045)  iopamidol (ISOVUE-370) 76 % injection 100 mL (70 mLs Intravenous Contrast Given 10/14/16 1622)  morphine 4 MG/ML injection 4 mg (4 mg Intravenous Given 10/14/16 1702)  albuterol (PROVENTIL,VENTOLIN) solution continuous neb (10 mg/hr Nebulization Given 10/14/16 1734)  methylPREDNISolone sodium succinate (SOLU-MEDROL) 125 mg/2 mL injection 125 mg (125 mg Intravenous Given 10/14/16 1750)  ipratropium (ATROVENT) nebulizer solution 0.5 mg (0.5 mg Nebulization Given 10/14/16 1734)       Hospitalist was called for admission for pleural effusion  Review of Systems:    Pertinent positives include: chills, chest pain,  Constitutional:  No weight loss, night sweats, Fevers,  fatigue, weight loss  HEENT:  No headaches, Difficulty swallowing,Tooth/dental problems,Sore throat,  No sneezing, itching, ear ache, nasal congestion, post nasal drip,  Cardio-vascular:  No Orthopnea, PND, anasarca, dizziness, palpitations.no Bilateral lower extremity swelling  GI:  No heartburn, indigestion, abdominal pain, nausea, vomiting, diarrhea, change in bowel habits, loss of appetite, melena, blood in stool, hematemesis Resp:  no shortness of breath at rest. No dyspnea on exertion, No excess mucus, no productive cough, No non-productive  cough, No coughing up of blood.No change in color of mucus.No wheezing. Skin:  no rash or lesions. No jaundice GU:  no dysuria, change in color of  urine, no urgency or frequency. No straining to urinate.  No flank pain.  Musculoskeletal:  No joint pain or no joint swelling. No decreased range of motion. No back pain.  Psych:  No change in mood or affect. No depression or anxiety. No memory loss.  Neuro: no localizing neurological complaints, no tingling, no weakness, no double vision, no gait abnormality, no slurred speech, no confusion  As per HPI otherwise 10 point review of systems negative.   Past Medical History: Past Medical History:  Diagnosis Date  . Cancer [facility])    right breast cancer  . COPD (chronic obstructive pulmonary disease) ([place])   . Oxygen dependent    Past Surgical History:  Procedure Laterality Date  . ABDOMINAL HYSTERECTOMY  1990  . BACK SURGERY    . BREAST SURGERY Right    lumpectomy w/ radiation  . IR GENERIC HISTORICAL  10/07/2016   IR GUIDED DRAIN W CATHETER PLACEMENT 10/07/2016 Sandi Mariscal, MD [facility]-INTERV RAD  . IR GENERIC HISTORICAL  10/10/2016   IR REMOVAL OF PLURAL CATH W/CUFF 10/10/2016 Corrie Mckusick, DO [facility]-INTERV RAD     Social History:  Ambulatory cane     reports that she quit smoking about 2 years ago. Her smoking use included Cigarettes. She has a 117.00 pack-year smoking history. She has never used smokeless tobacco. She reports that she does not drink alcohol or use drugs.  Allergies:  No Known Allergies     Family History:   Family History  Problem Relation Age of Onset  . Breast cancer Mother   . AAA (abdominal aortic aneurysm) Father   . Lung cancer Sister   . Cancer Other   . Diabetes Neg Hx   . CVA Neg Hx   . CAD Neg Hx     Medications: Prior to Admission medications   Medication Sig Start Date End Date Taking? Authorizing Provider  albuterol (PROVENTIL HFA;VENTOLIN HFA) 108 (90 BASE) MCG/ACT inhaler Inhale 2 puffs into the lungs every 6 (six) hours as needed for wheezing or shortness of breath.    Yes Historical Provider, MD  atorvastatin (LIPITOR) 40 MG tablet Take  40 mg by mouth daily.   Yes Historical Provider, MD  FOLIC ACID PO Take 1 mg by mouth daily.    Yes Historical Provider, MD  irbesartan-hydrochlorothiazide (AVALIDE) 300-12.5 MG per tablet Take 1 tablet by mouth daily.   Yes Historical Provider, MD  methotrexate (RHEUMATREX) 2.5 MG tablet Take 15 mg by mouth once a week. Every Tuesday. 08/27/16  Yes Historical Provider, MD  oxyCODONE-acetaminophen (PERCOCET) 10-325 MG per tablet Take 1 tablet by mouth every 6 (six) hours as needed for pain. Patient taking differently: Take 1 tablet by mouth 5 (five) times daily as needed for pain.  12/08/13  Yes Theodis Blaze, MD  predniSONE (DELTASONE) 5 MG tablet Take 10 mg by mouth every morning. 08/30/16  Yes Historical Provider, MD  pregabalin (LYRICA) 200 MG capsule Take 200 mg by mouth daily.    Yes Historical Provider, MD  SYMBICORT 160-4.5 MCG/ACT inhaler USE 2 PUFFS FIRST THING IN THE MORNING AND 2 PUFFS ABOUT 12 HOURS LATER 05/22/15  Yes Tanda Rockers, MD  feeding supplement, ENSURE ENLIVE, (ENSURE ENLIVE) LIQD Take 237 mLs by mouth 2 (two) times daily between meals. 10/08/16   Louellen Molder, MD  Physical Exam: Patient Vitals for the past 24 hrs:  BP Pulse Resp SpO2 Height Weight  10/14/16 1922 150/63 111 23 90 % - -  10/14/16 1738 - - - 95 % - -  10/14/16 1643 143/60 95 22 94 % - -  10/14/16 1600 - - - 93 % - -  10/14/16 1344 - - - - '5\' 6"'$  (1.676 m) 109.3 kg (241 lb)  10/14/16 1321 - - - 92 % - -    1. General:  in No Acute distress 2. Psychological: Alert and  Oriented 3. Head/ENT:   Moist  Mucous Membranes                          Head Non traumatic, neck supple                          Normal Dentition 4. SKIN:   decreased Skin turgor,  Skin clean Dry and intact no rash 5. Heart: Regular rate and rhythm no Murmur, Rub or gallop 6. Lungs:   no wheezes or crackles  Diminished on the right 7. Abdomen: Soft,  non-tender, Non distended obese 8. Lower extremities: no clubbing, cyanosis, or  edema 9. Neurologically Grossly intact, moving all 4 extremities equally t 10. MSK: Normal range of motion   body mass index is 38.9 kg/m.  Labs on Admission:   Labs on Admission: I have personally reviewed following labs and imaging studies  CBC:  Recent Labs Lab 10/14/16 1341  WBC 10.9*  NEUTROABS 9.2*  HGB 10.6*  HCT 35.5*  MCV 89.2  PLT 350   Basic Metabolic Panel:  Recent Labs Lab 10/14/16 1341  NA 139  K 3.4*  CL 97*  CO2 32  GLUCOSE 148*  BUN 6  CREATININE 0.56  CALCIUM 9.7   GFR: Estimated Creatinine Clearance: 79.6 mL/min (by C-G formula based on SCr of 0.56 mg/dL). Liver Function Tests: No results for input(s): AST, ALT, ALKPHOS, BILITOT, PROT, ALBUMIN in the last 168 hours. No results for input(s): LIPASE, AMYLASE in the last 168 hours. No results for input(s): AMMONIA in the last 168 hours. Coagulation Profile: No results for input(s): INR, PROTIME in the last 168 hours. Cardiac Enzymes: No results for input(s): CKTOTAL, CKMB, CKMBINDEX, TROPONINI in the last 168 hours. BNP (last 3 results) No results for input(s): PROBNP in the last 8760 hours. HbA1C: No results for input(s): HGBA1C in the last 72 hours. CBG:  Recent Labs Lab 10/08/16 0801  GLUCAP 103*   Lipid Profile: No results for input(s): CHOL, HDL, LDLCALC, TRIG, CHOLHDL, LDLDIRECT in the last 72 hours. Thyroid Function Tests: No results for input(s): TSH, T4TOTAL, FREET4, T3FREE, THYROIDAB in the last 72 hours. Anemia Panel: No results for input(s): VITAMINB12, FOLATE, FERRITIN, TIBC, IRON, RETICCTPCT in the last 72 hours.  Sepsis Labs: '@LABRCNTIP'$ (procalcitonin:4,lacticidven:4) )No results found for this or any previous visit (from the past 240 hour(s)).    UA  not ordered  Lab Results  Component Value Date   HGBA1C 6.4 (H) 12/03/2013    Estimated Creatinine Clearance: 79.6 mL/min (by C-G formula based on SCr of 0.56 mg/dL).  BNP (last 3 results) No results for  input(s): PROBNP in the last 8760 hours.   ECG REPORT ordered  Filed Weights   10/14/16 1344  Weight: 109.3 kg (241 lb)     Cultures:    Component Value Date/Time   SDES FLUID PLEURAL RIGHT 09/25/2016  [place] RIGHT 09/25/2016 [place] DRAWN AEROBIC AND ANAEROBIC 10CC 09/25/2016 1548   [place] 09/25/2016 1548   CULT  09/25/2016 1548    NO GROWTH 5 DAYS Performed at [place] 09/30/2016 FINAL 09/25/2016 1548   REPTSTATUS 09/25/2016 FINAL 09/25/2016 1548     Radiological Exams on Admission: Dg Chest 2 View  Result Date: 10/14/2016 CLINICAL DATA:  Left-sided breast pain and shortness of breath. History of lung cancer. EXAM: CHEST  2 VIEW COMPARISON:  Chest radiograph 10/07/2016 and chest CT 09/24/2016 FINDINGS: Cardiomediastinal contours are unchanged. Small right pleural effusion with loculated component along the right lateral hemithorax is slightly decreased in size. Multiple nodular opacities overlying the right lung are unchanged. There is atherosclerotic calcification within the aortic arch. No pneumothorax, focal airspace consolidation or pulmonary edema. IMPRESSION: 1. No acute cardiopulmonary disease. 2. Decreased size of partially loculated right pleural effusion and unchanged multifocal nodular opacities of the right hemithorax. 3. Aortic atherosclerosis. Electronically Signed   By: Ulyses Jarred M.D.   On: 10/14/2016 14:12   Ct Angio Chest Pe W Or Wo Contrast  Result Date: 10/14/2016 CLINICAL DATA:  Short of breath. Pleural drainage catheter removed 1 week prior. EXAM: CT ANGIOGRAPHY CHEST WITH CONTRAST TECHNIQUE: Multidetector CT imaging of the chest was performed using the standard protocol during bolus administration of intravenous contrast. Multiplanar CT image reconstructions and MIPs were obtained to evaluate the vascular anatomy. CONTRAST:  70 mL Isovue COMPARISON:  CT 09/24/2016 FINDINGS:  Cardiovascular: Coronary artery calcification and aortic atherosclerotic calcification. No pericardial fluid. Mediastinum/Nodes: No axillary or supraclavicular adenopathy. No mediastinal adenopathy. Lungs/Pleura: There are multiple small pockets of loculated pleural fluid along the posterior aspect of the RIGHT hemithorax. Interval reduction of moderate dependent effusion effusion seen on comparison exam. Some fluid extends along the fissure. No pneumothorax. Rounded nodule in the anterior RIGHT middle lobe measuring 9 mm (image 63, series 11. Nodule in the anterior RIGHT upper lobe additionally measuring 16 mm on image 44, series. These 2 pulmonary nodules are not changed comparison exam. LEFT lung clear Upper Abdomen: Limited view of the liver, kidneys, pancreas are unremarkable. Normal adrenal glands. Musculoskeletal: No aggressive osseous lesion. Review of the MIP images confirms the above findings. IMPRESSION: 1. Multiple small pockets of loculated pleural fluid within the RIGHT hemithorax replaces the previous seen moderate pleural effusion. 2. Stable pulmonary nodules the RIGHT upper lobe and RIGHT middle lobe of undetermined etiology. 3. No pneumothorax Electronically Signed   By: Suzy Bouchard M.D.   On: 10/14/2016 17:00    Chart has been reviewed    Assessment/Plan  70 y.o. female with medical history significant of lung cancer recurrent pleural effusion, COPD on home oxygen up to 3 L history of right breast malignancy status post lumpectomy and radiation about 30 has back, hypertension, recently diagnosed rheumatoid arthritis (on weekly methotrexate and daily prednisone)  Admitted to step down due to symptomatic pleural effusion  Present on Admission: . Acute on chronic respiratory failure with hypoxia (HCC) secondary to pleural effusion and they'll continue with oxygen and admit to step down . Adenocarcinoma of lung, right [facility])- patient supposed to follow-up with Dr. Earlie Server as an  outpatient this has been a fairly recent diagnosis . COPD GOLD II - continue nebulizer treatments when necessary . Essential hypertension- currently blood pressure stable continue home medications . Exudative pleural effusion most likely secondary to malignancy. We'll need to have Pleurx catheter  replaced IR consult ordered . Hypokalemia we'll replace and check magnesium level    Other plan as per orders.  DVT prophylaxis:  SCD    Code Status:  FULL CODE   as per patient    Family Communication:   Family not  at  Bedside    Disposition Plan:    To home once workup is complete and patient is stable                                                 Consults called: IR consult ordered, discussed with PCCM to make them aware patient will be in stepdown  Admission status:   inpatient       Level of care   SDU   I have spent a total of 56 min on this admission  extra time was spent to discuss case   Doreena Maulden 10/15/2016, 12:47 AM    [facility]  Pager [phone]   after 2 AM please page floor coverage PA If 7AM-7PM, please contact the day team taking care of the patient  [email or web address]  Password [email or web address]

## 2016-10-14 NOTE — ED Triage Notes (Addendum)
Per [facility]- Pt DX with Lung Cancer stage IV. Pt seen here for increased fluid to lung. Pt Plurerix catheter leaking and removed last week. Pt is on 3LNC home oxygen. Pt believes her lungs are filling with fluid. Pt c/o of side pain however this is not new. EDP Allen present upon pt's arrival to ED room.

## 2016-10-14 NOTE — ED Provider Notes (Signed)
[place] DEPT Provider Note   CSN: [id number] Arrival date & time: 10/14/16  1308     History   Chief Complaint Chief Complaint  Patient presents with  . Shortness of Breath  . Lung Cancer    Stage four    HPI [name] is a 70 y.o. female.  70 year old female with history of stage IV lung cancer as well as pleural effusions who recently had a right-sided pleural effusion drained presents with increasing shortness of breath for last several days. Patient feels like the fluid has reaccumulated. Denies any fever or cough. No recent blood loss. No chest pain. Denies any CHF symptoms. Was seen by her home health nurse today and told to come in for further evaluation.      Past Medical History:  Diagnosis Date  . Cancer [facility]    right breast cancer  . COPD (chronic obstructive pulmonary disease) [facility])     Patient Active Problem List   Diagnosis Date Noted  . Malignant pleural effusion 10/08/2016  . Adenocarcinoma of lung, right ([place]) 10/08/2016  . Essential hypertension 10/02/2016  . Acute on chronic respiratory failure with hypoxia ([place]) 10/02/2016  . Recurrent right pleural effusion 10/02/2016  . Leukocytosis 10/02/2016  . Hypokalemia 10/02/2016  . Right lower lobe pneumonia ([place]) 10/02/2016  . Thyroid nodule 10/02/2016  . Adenocarcinoma of right lung, stage 4 ([place]) 10/02/2016  . Chest pain in adult   . Exudative pleural effusion   . Lung nodule seen on imaging study 09/26/2016  . COPD GOLD II 12/03/2013    Past Surgical History:  Procedure Laterality Date  . ABDOMINAL HYSTERECTOMY  1990  . BACK SURGERY    . BREAST SURGERY Right    lumpectomy w/ radiation  . IR GENERIC HISTORICAL  10/07/2016   IR GUIDED DRAIN W CATHETER PLACEMENT 10/07/2016 Sandi Mariscal, MD [facility]-INTERV RAD  . IR GENERIC HISTORICAL  10/10/2016   IR REMOVAL OF PLURAL CATH W/CUFF 10/10/2016 Corrie Mckusick, DO [facility]-INTERV RAD    OB History    No data available       Home Medications     Prior to Admission medications   Medication Sig Start Date End Date Taking? Authorizing Provider  albuterol (PROVENTIL HFA;VENTOLIN HFA) 108 (90 BASE) MCG/ACT inhaler Inhale 2 puffs into the lungs every 6 (six) hours as needed for wheezing or shortness of breath.     Historical Provider, MD  atorvastatin (LIPITOR) 40 MG tablet Take 40 mg by mouth daily.    Historical Provider, MD  feeding supplement, ENSURE ENLIVE, (ENSURE ENLIVE) LIQD Take 237 mLs by mouth 2 (two) times daily between meals. 10/08/16   Nishant Dhungel, MD  FOLIC ACID PO Take 1 mg by mouth daily.     Historical Provider, MD  irbesartan-hydrochlorothiazide (AVALIDE) 300-12.5 MG per tablet Take 1 tablet by mouth daily.    Historical Provider, MD  methotrexate (RHEUMATREX) 2.5 MG tablet Take 15 mg by mouth once a week. Every Tuesday. 08/27/16   Historical Provider, MD  oxyCODONE-acetaminophen (PERCOCET) 10-325 MG per tablet Take 1 tablet by mouth every 6 (six) hours as needed for pain. Patient taking differently: Take 1 tablet by mouth 5 (five) times daily as needed for pain.  12/08/13   Theodis Blaze, MD  predniSONE (DELTASONE) 5 MG tablet Take 10 mg by mouth every morning. 08/30/16   Historical Provider, MD  pregabalin (LYRICA) 200 MG capsule Take 200 mg by mouth daily.     Historical Provider, MD  SYMBICORT 160-4.5 MCG/ACT  inhaler USE 2 PUFFS FIRST THING IN THE MORNING AND 2 PUFFS ABOUT 12 HOURS LATER 05/22/15   Tanda Rockers, MD    Family History No family history on file.  Social History Social History  Substance Use Topics  . Smoking status: Former Smoker    Packs/day: 3.00    Years: 39.00    Types: Cigarettes    Quit date: 12/03/2013  . Smokeless tobacco: Never Used     Comment: Using e-cig frequently 04/15/14  . Alcohol use No     Allergies   Patient has no known allergies.   Review of Systems Review of Systems  All other systems reviewed and are negative.    Physical Exam Updated Vital Signs SpO2 92%    Physical Exam  Constitutional: She is oriented to person, place, and time. She appears well-developed and well-nourished.  Non-toxic appearance. No distress.  HENT:  Head: Normocephalic and atraumatic.  Eyes: Conjunctivae, EOM and lids are normal. Pupils are equal, round, and reactive to light.  Neck: Normal range of motion. Neck supple. No tracheal deviation present. No thyroid mass present.  Cardiovascular: Normal rate, regular rhythm and normal heart sounds.  Exam reveals no gallop.   No murmur heard. Pulmonary/Chest: Effort normal. No stridor. No respiratory distress. She has decreased breath sounds in the right lower field. She has no wheezes. She has no rhonchi. She has no rales.  Abdominal: Soft. Normal appearance and bowel sounds are normal. She exhibits no distension. There is no tenderness. There is no rebound and no CVA tenderness.  Musculoskeletal: Normal range of motion. She exhibits no edema or tenderness.  Neurological: She is alert and oriented to person, place, and time. She has normal strength. No cranial nerve deficit or sensory deficit. GCS eye subscore is 4. GCS verbal subscore is 5. GCS motor subscore is 6.  Skin: Skin is warm and dry. No abrasion and no rash noted.  Psychiatric: She has a normal mood and affect. Her speech is normal and behavior is normal.  Nursing note and vitals reviewed.    ED Treatments / Results  Labs (all labs ordered are listed, but only abnormal results are displayed) Labs Reviewed  CBC WITH DIFFERENTIAL/PLATELET  BASIC METABOLIC PANEL    EKG  EKG Interpretation None       Radiology No results found.  Procedures Procedures (including critical care time)  Medications Ordered in ED Medications  0.9 %  sodium chloride infusion (not administered)     Initial Impression / Assessment and Plan / ED Course  I have reviewed the triage vital signs and the nursing notes.  Pertinent labs & imaging results that were available  during my care of the patient were reviewed by me and considered in my medical decision making (see chart for details).  Clinical Course     Cxr and chest ct with loculated pleural effusion w/o increased size Remains dyspnic and will tx for possible copd exacerbation Care signed out to dr Regenia Skeeter  Final Clinical Impressions(s) / ED Diagnoses   Final diagnoses:  SOB (shortness of breath)    New Prescriptions New Prescriptions   No medications on file     Lacretia Leigh, MD 10/16/16 1355

## 2016-10-14 NOTE — ED Notes (Signed)
Hospitalist with patient in room

## 2016-10-14 NOTE — ED Notes (Signed)
ED Provider at bedside. 

## 2016-10-14 NOTE — Telephone Encounter (Signed)
Very severe pain under left breast -( R pleurix tube removed Thursday)  And now cannot hardly catch her breath with very diminished breath sounds per nurse. 0 2 89%  on 3 liters at rest. Pulse 120". I instructed pt to go to ED for evaluation at [facility] where tube was removed. Nurse is calling EMS to take pt.

## 2016-10-14 NOTE — ED Notes (Signed)
Patient transported to X-ray 

## 2016-10-14 NOTE — Progress Notes (Signed)
EDCM spoke to patient at bedside.  Patient lives with her sister.  Patient reports up until a month ago she was able to perform her ADL's on her own, but now her sister has to assist her.  Patient reports her sister also assists her with meals.  Patient reports she wears oxygen at 3 liters continuously at home.  Patient confirms she receives home health services with RN, PT and oxygen from [facility].  Patient reports she has a walker, cane, and bedside commode at home.  Patient confirms her pcp is Dr. Ashby Dawes.  Patient's oncologist is Dr. Julien Nordmann.  No further EDCM needs at this time.

## 2016-10-14 NOTE — ED Provider Notes (Signed)
Patient shortness of breath and tightness in her chest are essentially unchanged. She is not in distress. She will be admitted to the hospitalist.   Sherwood Gambler, MD 10/15/16 0110

## 2016-10-14 NOTE — ED Notes (Signed)
CT called to ask for a second IV 20 or larger in the [facility] or above

## 2016-10-15 ENCOUNTER — Other Ambulatory Visit: Payer: Medicare Other

## 2016-10-15 LAB — TROPONIN I
Troponin I: <0.03 ng/mL (ref <0.03)
Troponin I: <0.03 ng/mL (ref <0.03)
Troponin I: <0.03 ng/mL (ref <0.03)

## 2016-10-15 LAB — COMPREHENSIVE METABOLIC PANEL
ALT: 21 U/L (ref 14–54)
ANION GAP: 8 (ref 5–15)
AST: 19 U/L (ref 15–41)
Albumin: 2.6 g/dL — ABNORMAL LOW (ref 3.5–5.0)
Alkaline Phosphatase: 72 U/L (ref 38–126)
BUN: 13 mg/dL (ref 6–20)
CHLORIDE: 97 mmol/L — AB (ref 101–111)
CO2: 33 mmol/L — AB (ref 22–32)
CREATININE: 0.62 mg/dL (ref 0.44–1.00)
Calcium: 9.1 mg/dL (ref 8.9–10.3)
GFR calc non Af Amer: >60 mL/min (ref >60)
Glucose, Bld: 172 mg/dL — ABNORMAL HIGH (ref 65–99)
POTASSIUM: 3.9 mmol/L (ref 3.5–5.1)
SODIUM: 138 mmol/L (ref 135–145)
Total Bilirubin: 0.6 mg/dL (ref 0.3–1.2)
Total Protein: 6.4 g/dL — ABNORMAL LOW (ref 6.5–8.1)

## 2016-10-15 LAB — CBC
HEMATOCRIT: 34.4 % — AB (ref 36.0–46.0)
HEMOGLOBIN: 10.4 g/dL — AB (ref 12.0–15.0)
MCH: 26.9 pg (ref 26.0–34.0)
MCHC: 30.2 g/dL (ref 30.0–36.0)
MCV: 89.1 fL (ref 78.0–100.0)
Platelets: 366 10*3/uL (ref 150–400)
RBC: 3.86 MIL/uL — AB (ref 3.87–5.11)
RDW: 15.5 % (ref 11.5–15.5)
WBC: 8.8 10*3/uL (ref 4.0–10.5)

## 2016-10-15 LAB — MAGNESIUM: Magnesium: 1.6 mg/dL — ABNORMAL LOW (ref 1.7–2.4)

## 2016-10-15 LAB — MRSA PCR SCREENING: MRSA BY PCR: NEGATIVE

## 2016-10-15 LAB — TSH: TSH: 0.083 u[IU]/mL — ABNORMAL LOW (ref 0.350–4.500)

## 2016-10-15 LAB — PHOSPHORUS: Phosphorus: 3.7 mg/dL (ref 2.5–4.6)

## 2016-10-15 MED ORDER — IPRATROPIUM-ALBUTEROL 0.5-2.5 (3) MG/3ML IN SOLN
RESPIRATORY_TRACT | Status: AC
  Filled 2016-10-15: qty 3

## 2016-10-15 MED ORDER — ALBUTEROL SULFATE (2.5 MG/3ML) 0.083% IN NEBU: 2.5000 mg | INHALATION_SOLUTION | RESPIRATORY_TRACT | Status: DC | PRN

## 2016-10-15 MED ORDER — IPRATROPIUM-ALBUTEROL 0.5-2.5 (3) MG/3ML IN SOLN
3.0000 mL | Freq: Four times a day (QID) | RESPIRATORY_TRACT | Status: DC
  Administered 2016-10-15: 3 mL via RESPIRATORY_TRACT

## 2016-10-15 MED ORDER — FUROSEMIDE 10 MG/ML IJ SOLN
40.0000 mg | Freq: Two times a day (BID) | INTRAMUSCULAR | Status: DC
  Administered 2016-10-15 – 2016-10-16 (×2): 40 mg via INTRAVENOUS
  Filled 2016-10-15 (×3): qty 4

## 2016-10-15 MED ORDER — IPRATROPIUM-ALBUTEROL 0.5-2.5 (3) MG/3ML IN SOLN
3.0000 mL | Freq: Two times a day (BID) | RESPIRATORY_TRACT | Status: DC
  Administered 2016-10-15 – 2016-10-17 (×5): 3 mL via RESPIRATORY_TRACT
  Filled 2016-10-15 (×5): qty 3

## 2016-10-15 MED ORDER — MAGNESIUM SULFATE 2 GM/50ML IV SOLN
2.0000 g | Freq: Once | INTRAVENOUS | Status: AC
  Administered 2016-10-15: 2 g via INTRAVENOUS
  Filled 2016-10-15: qty 50

## 2016-10-15 NOTE — Progress Notes (Signed)
PROGRESS NOTE    [name]  TML:[id number] DOB: [birth year] DOA: 10/14/2016 PCP: Merrilee Seashore, MD   Brief Narrative: [name] is a 70 y.o. female with medical history significant of lung cancer with recurrent pleural effusion, COPD on home oxygen up to 3 L, history of right breast malignancy status post lumpectomy and radiation about 30 years ago, hypertension, recently diagnosed rheumatoid arthritis (on weekly methotrexate and daily prednisone) and other comorbids who presented to [facility] because of SOB.  Patient recently was admitted on 21st of November and discharged on 6 of December she was found to have acute respiratory failure hypoxia was found to have malignant pleural effusion and underwent thoracentesis cytology showed adenocarcinoma; primary IR at that time Placed catheter which was done on December 4 Dr. Earlie Server supposed follow-up patient she had a PET scan ordered as an outpatient at that time MRI of the brain was negative for brain metastases she was discharged home on a new home oxygen given pleural effusion. She received Right Pleurex Catheter for her Recurrent Right Pleural Effusion and it was drained x2. Unfortunately she had to have the drain removed Thursday because of it being damaged when attempted to be repositioned because of significant drainaget around the tube. IR was unable to exchange her tube and she was D/C'd home and monitored for possible new Pleurex Catheter placement. Patient came to the the ER with worsening SOB and was admitted to SDU.   Assessment & Plan:   Active Problems:   COPD GOLD II   Exudative pleural effusion   Essential hypertension   Acute on chronic respiratory failure with hypoxia (HCC)   Hypokalemia   Malignant pleural effusion   Adenocarcinoma of lung, right (HCC)   COPD exacerbation (HCC)   Pleural effusion  Acute on Chronic Respiratory Failure likely 2/2 to Loculated Pleural Fluid from Adenocarcinoma with ? COPD Exacerbation -C/w  Supplemental O2 -Start IV Diuressis with 40 mg of IV Lasix -Chest CT showed Multiple small pockets of loculated pleural fluid within the RIGHT hemithorax replaces the previous seen moderate pleural effusion. Stable pulmonary nodules the RIGHT upper lobe and RIGHT middle lobe of undetermined etiology. No pneumothorax -Per Interventional Radiology: Aware of request for PleurX catheter placement.  The patient's x-rays and CT have been reviewed by Dr. Vernard Gambles.  The patient does not have enough fluid in order to be able to replace her PleurX catheter or even for a thoracentesis.  Her SOB likely has another etiology at this time.  If the patient redevelops fluid please reconsider consultation.  -C/w DuoNeb 3 mL BID, Dulera, and Albuterol Nebs -C/w Guaifenesin 600 mg po BID -Afebrile; no WBC -Repeat CXR in AM -Continue to Monitor  Adenocarcinoma of Right Lung -Dr. Julien Nordmann Notified in Epic for [place] Consult  COPD  -As Above  Hypertension -Continue to Monitor BP's -IV Diruesis  Hypokalemia -Repleted -Repeat CMP in AM  Hypomagnesemia -Replete -Repeat Mag in AM  Rheumatoid Arthritis -C/w Prednisone Daily  Hyperlipidemia -C/w Atorvastatin 40 mg po Daily  DVT prophylaxis: SCDs Code Status: FULL CODE Family Communication: No Family Present at Bedside Disposition Plan: [facility] vs. SNF at D/C  Consultants:   Interventional Radiology  Dr. Curt Bears Notified via Epic for [place]  Procedures:  None  Antimicrobials: None  Subjective: Seen and examined and was SOB. No N/V or Cp. Stated she felt like she did when she first had her Pleural Effusion. No other concerns or complaints at this time.   Objective: Vitals:   10/15/16  1100 10/15/16 1300 10/15/16 1400 10/15/16 1600  BP: 179/73 (!) 128/45 (!) 134/58 (!) 115/37  Pulse: 82 98 93 92  Resp: 14 20 (!) 24 20  Temp:  98.2 F (36.8 C)  98.5 F (36.9 C)  TempSrc:    Oral  SpO2: 94% 91% 92% 93%  Weight:    104.4  kg (230 lb 2.6 oz)  Height:    '5\' 6"'$  (1.676 m)    Intake/Output Summary (Last 24 hours) at 10/15/16 1639 Last data filed at 10/15/16 1307  Gross per 24 hour  Intake              386 ml  Output                0 ml  Net              386 ml   Filed Weights   10/14/16 1344 10/15/16 1600  Weight: 109.3 kg (241 lb) 104.4 kg (230 lb 2.6 oz)    Examination: Physical Exam:  Constitutional: WN/WD, NAD and appears in some Respiratory Dirstress Eyes: Lids and conjunctivae normal, sclerae anicteric  ENMT: External Ears, Nose appear normal. Grossly normal hearing.  Neck: Appears normal, supple, no cervical masses, normal ROM, no appreciable thyromegaly Respiratory: Diminished on Right with mild Crackles. No wheezing or rhonci. Normal respiratory effort and patient is not tachypenic. No accessory muscle use. Wearing supplemental O2 via  Cardiovascular: RRR, no murmurs / rubs / gallops. S1 and S2 auscultated. No extremity edema.  Abdomen: Soft, non-tender, non-distended. No masses palpated. No appreciable hepatosplenomegaly. Bowel sounds positive x4.  GU: Deferred. Musculoskeletal: No clubbing / cyanosis of digits/nails. No joint deformity upper and lower extremities.  Skin: No rashes, lesions, ulcers. No induration; Warm and dry.  Neurologic: CN 2-12 grossly intact with no focal deficits. Sensation intact in all 4 Extremities. Romberg sign cerebellar reflexes not assessed.  Psychiatric: Normal judgment and insight. Alert and oriented x 3. Anxious mood and appropriate affect.   Data Reviewed: I have personally reviewed following labs and imaging studies  CBC:  Recent Labs Lab 10/14/16 1341 10/15/16 0427  WBC 10.9* 8.8  NEUTROABS 9.2*  --   HGB 10.6* 10.4*  HCT 35.5* 34.4*  MCV 89.2 89.1  PLT 374 287   Basic Metabolic Panel:  Recent Labs Lab 10/14/16 1341 10/15/16 0427  NA 139 138  K 3.4* 3.9  CL 97* 97*  CO2 32 33*  GLUCOSE 148* 172*  BUN 6 13  CREATININE 0.56 0.62    CALCIUM 9.7 9.1  MG  --  1.6*  PHOS  --  3.7   GFR: Estimated Creatinine Clearance: 77.6 mL/min (by C-G formula based on SCr of 0.62 mg/dL). Liver Function Tests:  Recent Labs Lab 10/15/16 0427  AST 19  ALT 21  ALKPHOS 72  BILITOT 0.6  PROT 6.4*  ALBUMIN 2.6*   No results for input(s): LIPASE, AMYLASE in the last 168 hours. No results for input(s): AMMONIA in the last 168 hours. Coagulation Profile: No results for input(s): INR, PROTIME in the last 168 hours. Cardiac Enzymes:  Recent Labs Lab 10/15/16 0021 10/15/16 0427 10/15/16 1232  TROPONINI <0.03 <0.03 <0.03   BNP (last 3 results) No results for input(s): PROBNP in the last 8760 hours. HbA1C: No results for input(s): HGBA1C in the last 72 hours. CBG: No results for input(s): GLUCAP in the last 168 hours. Lipid Profile: No results for input(s): CHOL, HDL, LDLCALC, TRIG, CHOLHDL, LDLDIRECT in the last 72  hours. Thyroid Function Tests:  Recent Labs  10/15/16 0428  TSH 0.083*   Anemia Panel: No results for input(s): VITAMINB12, FOLATE, FERRITIN, TIBC, IRON, RETICCTPCT in the last 72 hours. Sepsis Labs: No results for input(s): PROCALCITON, LATICACIDVEN in the last 168 hours.  No results found for this or any previous visit (from the past 240 hour(s)).   Radiology Studies: Dg Chest 2 View  Result Date: 10/14/2016 CLINICAL DATA:  Left-sided breast pain and shortness of breath. History of lung cancer. EXAM: CHEST  2 VIEW COMPARISON:  Chest radiograph 10/07/2016 and chest CT 09/24/2016 FINDINGS: Cardiomediastinal contours are unchanged. Small right pleural effusion with loculated component along the right lateral hemithorax is slightly decreased in size. Multiple nodular opacities overlying the right lung are unchanged. There is atherosclerotic calcification within the aortic arch. No pneumothorax, focal airspace consolidation or pulmonary edema. IMPRESSION: 1. No acute cardiopulmonary disease. 2. Decreased size  of partially loculated right pleural effusion and unchanged multifocal nodular opacities of the right hemithorax. 3. Aortic atherosclerosis. Electronically Signed   By: Ulyses Jarred M.D.   On: 10/14/2016 14:12   Ct Angio Chest Pe W Or Wo Contrast  Result Date: 10/14/2016 CLINICAL DATA:  Short of breath. Pleural drainage catheter removed 1 week prior. EXAM: CT ANGIOGRAPHY CHEST WITH CONTRAST TECHNIQUE: Multidetector CT imaging of the chest was performed using the standard protocol during bolus administration of intravenous contrast. Multiplanar CT image reconstructions and MIPs were obtained to evaluate the vascular anatomy. CONTRAST:  70 mL Isovue COMPARISON:  CT 09/24/2016 FINDINGS: Cardiovascular: Coronary artery calcification and aortic atherosclerotic calcification. No pericardial fluid. Mediastinum/Nodes: No axillary or supraclavicular adenopathy. No mediastinal adenopathy. Lungs/Pleura: There are multiple small pockets of loculated pleural fluid along the posterior aspect of the RIGHT hemithorax. Interval reduction of moderate dependent effusion effusion seen on comparison exam. Some fluid extends along the fissure. No pneumothorax. Rounded nodule in the anterior RIGHT middle lobe measuring 9 mm (image 63, series 11. Nodule in the anterior RIGHT upper lobe additionally measuring 16 mm on image 44, series. These 2 pulmonary nodules are not changed comparison exam. LEFT lung clear Upper Abdomen: Limited view of the liver, kidneys, pancreas are unremarkable. Normal adrenal glands. Musculoskeletal: No aggressive osseous lesion. Review of the MIP images confirms the above findings. IMPRESSION: 1. Multiple small pockets of loculated pleural fluid within the RIGHT hemithorax replaces the previous seen moderate pleural effusion. 2. Stable pulmonary nodules the RIGHT upper lobe and RIGHT middle lobe of undetermined etiology. 3. No pneumothorax Electronically Signed   By: Suzy Bouchard M.D.   On: 10/14/2016  17:00   Scheduled Meds: . atorvastatin  40 mg Oral Daily  . furosemide  40 mg Intravenous BID  . guaiFENesin  600 mg Oral BID  . ipratropium-albuterol  3 mL Nebulization BID  . mometasone-formoterol  2 puff Inhalation BID  . predniSONE  10 mg Oral q morning - 10a  . pregabalin  200 mg Oral Daily  . sodium chloride flush  3 mL Intravenous Q12H  . sodium chloride flush  3 mL Intravenous Q12H   Continuous Infusions: . sodium chloride Stopped (10/15/16 1307)    LOS: 1 day   Kerney Elbe, DO [facility] Pager [phone]  If 7PM-7AM, please contact night-coverage [email or web address] Password [facility] 10/15/2016, 4:39 PM

## 2016-10-15 NOTE — Evaluation (Addendum)
Physical Therapy Evaluation Patient Details Name: [name] MRN: [id number] DOB: 08-Oct-1946 Today's Date: 10/15/2016   History of Present Illness  70 yo female admitted with acute on chronic respiratory failure with hypoxia. Hx of lung ca, recurrent pleural effusion, COPD, RA  Clinical Impression  On eval, pt was Min guard assist for mobility. She walked ~20 feet in room with a RW. O2 sats 84% on 3L O2 at the lowest during session, 91% 3L O2 at rest. Discussed d/c plan-pt states she plans to return home with sister. Will follow and progress activity as tolerated.     Follow Up Recommendations Home health PT;Supervision/Assistance - 24 hour; ([place], if possible to assist with ADLs)    Equipment Recommendations   (4 wheeled rolling walker with seat to allow for rest breaks if/when needed)    Recommendations for Other Services       Precautions / Restrictions Precautions Precautions: Fall Precaution Comments: monitor O2 Restrictions Weight Bearing Restrictions: No      Mobility  Bed Mobility Overal bed mobility: Needs Assistance Bed Mobility: Supine to Sit     Supine to sit: Supervision;HOB elevated     General bed mobility comments: for lines  Transfers Overall transfer level: Needs assistance Equipment used: Rolling walker (2 wheeled) Transfers: Sit to/from Stand Sit to Stand: Min guard         General transfer comment: close guard for safety.   Ambulation/Gait Ambulation/Gait assistance: Min guard Ambulation Distance (Feet): 20 Feet Assistive device: Rolling walker (2 wheeled) Gait Pattern/deviations: Step-through pattern;Decreased stride length     General Gait Details: Took short walk around ICU room with RW. O2 sats dropped to 87% on 3L O2 during ambulation. Slow gait speed.   Stairs            Wheelchair Mobility    Modified Rankin (Stroke Patients Only)       Balance Overall balance assessment: Needs assistance            Standing balance-Leahy Scale: Poor                               Pertinent Vitals/Pain Pain Assessment: 0-10 Pain Score: 7  Pain Location: L flank/back area Pain Descriptors / Indicators: Aching Pain Intervention(s): Limited activity within patient's tolerance;Repositioned    Home Living Family/patient expects to be discharged to:: Private residence Living Arrangements: Other relatives Available Help at Discharge: Family Type of Home: Apartment Home Access: Stairs to enter Entrance Stairs-Rails: Left Entrance Stairs-Number of Steps: 1 flight Home Layout: One level Home Equipment: Environmental consultant - 2 wheels;Cane - single point;Bedside commode      Prior Function Level of Independence: Independent with assistive device(s)         Comments: uses cane vs RW (RW more so now)     Hand Dominance        Extremity/Trunk Assessment   Upper Extremity Assessment: Generalized weakness           Lower Extremity Assessment: Generalized weakness      Cervical / Trunk Assessment: Kyphotic  Communication   Communication: No difficulties  Cognition Arousal/Alertness: Awake/alert Behavior During Therapy: WFL for tasks assessed/performed Overall Cognitive Status: Within Functional Limits for tasks assessed                      General Comments      Exercises     Assessment/Plan    PT  Assessment Patient needs continued PT services  PT Problem List Decreased strength;Decreased mobility;Decreased activity tolerance;Decreased knowledge of use of DME;Pain;Cardiopulmonary status limiting activity          PT Treatment Interventions DME instruction;Gait training;Therapeutic activities;Therapeutic exercise;Patient/family education;Functional mobility training    PT Goals (Current goals can be found in the Care Plan section)  Acute Rehab PT Goals Patient Stated Goal: return to independence. home PT Goal Formulation: With patient Time For Goal Achievement:  10/29/16 Potential to Achieve Goals: Good    Frequency Min 3X/week   Barriers to discharge        Co-evaluation               End of Session Equipment Utilized During Treatment: Oxygen Activity Tolerance: Patient tolerated treatment well Patient left: in bed;with call bell/phone within reach;with bed alarm set           Time: 9758-8325 PT Time Calculation (min) (ACUTE ONLY): 18 min   Charges:   PT Evaluation $PT Eval Low Complexity: 1 Procedure     PT G Codes:        Weston Anna, MPT Pager: [phone]

## 2016-10-15 NOTE — Clinical Social Work Note (Signed)
Clinical Social Work Assessment  Patient Details  Name: [name] MRN: [id number] Date of Birth: 09/07/1946  Date of referral:  10/15/16               Reason for consult:                   Permission sought to share information with:    Permission granted to share information::     Name::        Agency::     Relationship::     Contact Information:     Housing/Transportation Living arrangements for the past 2 months:  Apartment Source of Information:  Patient Patient Interpreter Needed:  None Criminal Activity/Legal Involvement Pertinent to Current Situation/Hospitalization:  No - Comment as needed Significant Relationships:  Siblings Lives with:  Siblings (Lives with sister, Danie Chandler) Do you feel safe going back to the place where you live?    Need for family participation in patient care:     Care giving concerns: Unknown at this time   Facilities manager / plan: CSW spoke with patient at bedside with no family present. Patient reports she was brought to the ED because she could not breath. Patient reports she was "giving out of breath and gasping for air". Patient reports she lives at home with her sister, Danie Chandler who is her main support. Patient reports her sister is "not in good of shape either". Patient reports she has a walker and oxygen. Patient reports she was able to complete her ADL's, however, she states she now needs assistance. No questions noted for CSW at this time.   Employment status:  Retired Forensic scientist:  Other (Comment Required) Therapist, occupational) PT Recommendations:  Not assessed at this time Information / Referral to community resources:     Patient/Family's Response to care: Unknown at this time  Patient/Family's Understanding of and Emotional Response to Diagnosis, Current Treatment, and Prognosis: Unknown at this time  Emotional Assessment Appearance:  Appears stated age Attitude/Demeanor/Rapport:    Affect (typically observed):   Pleasant Orientation:  Oriented to Self, Oriented to Place, Oriented to  Time, Oriented to Situation Alcohol / Substance use:  Not Applicable Psych involvement (Current and /or in the community):  No (Comment)  Discharge Needs  Concerns to be addressed:    Readmission within the last 30 days:    Current discharge risk:    Barriers to Discharge:      Guadelupe Sabin, LCSW 10/15/2016, 11:44 AM

## 2016-10-15 NOTE — Progress Notes (Signed)
CSW consulted due to COPD protocol. Past admissions reviewed. Pt does not meet criteria for COPD protocol.   CSW signing off.  Werner Lean LCSW [phone]

## 2016-10-15 NOTE — ED Notes (Signed)
Delay in labs due to pt using restroom

## 2016-10-15 NOTE — Progress Notes (Signed)
Aware of request for PleurX catheter placement.  The patient's x-rays and CT have been reviewed by Dr. Vernard Gambles.  The patient does not have enough fluid in order to be able to replace her PleurX catheter or even for a thoracentesis.  Her SOB likely has another etiology at this time.  If the patient redevelops fluid please reconsider consultation.   Yamato Kopf E 8:39 AM 10/15/2016

## 2016-10-15 NOTE — Progress Notes (Signed)
ED CM notified [facility] in house coordinator of pt admission to be followed for d/c needs

## 2016-10-16 ENCOUNTER — Inpatient Hospital Stay (HOSPITAL_COMMUNITY): Payer: Medicare Other

## 2016-10-16 ENCOUNTER — Ambulatory Visit: Payer: Medicare Other

## 2016-10-16 DIAGNOSIS — J9621 Acute and chronic respiratory failure with hypoxia: Secondary | ICD-10-CM

## 2016-10-16 DIAGNOSIS — C3491 Malignant neoplasm of unspecified part of right bronchus or lung: Principal | ICD-10-CM

## 2016-10-16 DIAGNOSIS — I472 Ventricular tachycardia: Secondary | ICD-10-CM

## 2016-10-16 DIAGNOSIS — E876 Hypokalemia: Secondary | ICD-10-CM

## 2016-10-16 DIAGNOSIS — J910 Malignant pleural effusion: Secondary | ICD-10-CM

## 2016-10-16 LAB — MAGNESIUM: MAGNESIUM: 2.1 mg/dL (ref 1.7–2.4)

## 2016-10-16 LAB — CBC WITH DIFFERENTIAL/PLATELET
BASOS ABS: 0 10*3/uL (ref 0.0–0.1)
Basophils Relative: 0 %
EOS PCT: 0 %
Eosinophils Absolute: 0 10*3/uL (ref 0.0–0.7)
HCT: 33.2 % — ABNORMAL LOW (ref 36.0–46.0)
Hemoglobin: 10 g/dL — ABNORMAL LOW (ref 12.0–15.0)
LYMPHS PCT: 16 %
Lymphs Abs: 2.2 10*3/uL (ref 0.7–4.0)
MCH: 27.2 pg (ref 26.0–34.0)
MCHC: 30.1 g/dL (ref 30.0–36.0)
MCV: 90.2 fL (ref 78.0–100.0)
MONO ABS: 1.1 10*3/uL — AB (ref 0.1–1.0)
MONOS PCT: 8 %
Neutro Abs: 10.5 10*3/uL — ABNORMAL HIGH (ref 1.7–7.7)
Neutrophils Relative %: 76 %
PLATELETS: 412 10*3/uL — AB (ref 150–400)
RBC: 3.68 MIL/uL — ABNORMAL LOW (ref 3.87–5.11)
RDW: 15.9 % — AB (ref 11.5–15.5)
WBC: 13.8 10*3/uL — ABNORMAL HIGH (ref 4.0–10.5)

## 2016-10-16 LAB — COMPREHENSIVE METABOLIC PANEL
ALT: 18 U/L (ref 14–54)
ANION GAP: 8 (ref 5–15)
AST: 14 U/L — AB (ref 15–41)
Albumin: 2.7 g/dL — ABNORMAL LOW (ref 3.5–5.0)
Alkaline Phosphatase: 73 U/L (ref 38–126)
BILIRUBIN TOTAL: 0.6 mg/dL (ref 0.3–1.2)
BUN: 19 mg/dL (ref 6–20)
CHLORIDE: 99 mmol/L — AB (ref 101–111)
CO2: 33 mmol/L — ABNORMAL HIGH (ref 22–32)
Calcium: 8.8 mg/dL — ABNORMAL LOW (ref 8.9–10.3)
Creatinine, Ser: 0.66 mg/dL (ref 0.44–1.00)
Glucose, Bld: 114 mg/dL — ABNORMAL HIGH (ref 65–99)
POTASSIUM: 2.9 mmol/L — AB (ref 3.5–5.1)
Sodium: 140 mmol/L (ref 135–145)
TOTAL PROTEIN: 6.3 g/dL — AB (ref 6.5–8.1)

## 2016-10-16 LAB — PHOSPHORUS: PHOSPHORUS: 3.4 mg/dL (ref 2.5–4.6)

## 2016-10-16 MED ORDER — ROPINIROLE HCL 1 MG PO TABS
1.0000 mg | ORAL_TABLET | Freq: Every day | ORAL | Status: DC
  Administered 2016-10-16 (×2): 1 mg via ORAL
  Filled 2016-10-16 (×3): qty 1

## 2016-10-16 MED ORDER — PREMIER PROTEIN SHAKE
11.0000 [oz_av] | Freq: Two times a day (BID) | ORAL | Status: DC
  Administered 2016-10-17: 11 [oz_av] via ORAL
  Filled 2016-10-16 (×2): qty 325.31

## 2016-10-16 MED ORDER — LIP MEDEX EX OINT
TOPICAL_OINTMENT | CUTANEOUS | Status: DC | PRN
  Filled 2016-10-16: qty 7

## 2016-10-16 MED ORDER — FUROSEMIDE 40 MG PO TABS
40.0000 mg | ORAL_TABLET | Freq: Every day | ORAL | Status: DC
  Administered 2016-10-17: 40 mg via ORAL
  Filled 2016-10-16: qty 1

## 2016-10-16 MED ORDER — ADULT MULTIVITAMIN W/MINERALS CH
1.0000 | ORAL_TABLET | Freq: Every day | ORAL | Status: DC
  Administered 2016-10-16 – 2016-10-17 (×2): 1 via ORAL
  Filled 2016-10-16 (×2): qty 1

## 2016-10-16 MED ORDER — POTASSIUM CHLORIDE CRYS ER 20 MEQ PO TBCR
40.0000 meq | EXTENDED_RELEASE_TABLET | Freq: Once | ORAL | Status: AC
  Administered 2016-10-16: 40 meq via ORAL
  Filled 2016-10-16: qty 2

## 2016-10-16 MED ORDER — POTASSIUM CHLORIDE 20 MEQ/15ML (10%) PO SOLN
40.0000 meq | Freq: Once | ORAL | Status: DC
Start: 2016-10-16 — End: 2016-10-16
  Filled 2016-10-16: qty 30

## 2016-10-16 MED ORDER — POTASSIUM CHLORIDE 20 MEQ/15ML (10%) PO SOLN
40.0000 meq | Freq: Once | ORAL | Status: AC
  Administered 2016-10-16: 40 meq via ORAL

## 2016-10-16 NOTE — Progress Notes (Signed)
OT Cancellation Note  Patient Details Name: Keirra Zeimet MRN: [id number] DOB: [birth year]   Cancelled Treatment:    Reason Eval/Treat Not Completed: Pain limiting ability to participate.  Pt c/o severe back pain.  RN to check to see if she can have anything else. Will check back tomorrow.  Stephone Gum 10/16/2016, 2:51 PM  Lesle Chris, OTR/L [phone] 10/16/2016

## 2016-10-16 NOTE — Progress Notes (Signed)
Initial Nutrition Assessment  DOCUMENTATION CODES:   Obesity unspecified  INTERVENTION:  - Will order daily multivitamin.  - Will order Premier Protein BID, each supplement provides 160 kcal and 30 grams of protein.  - Continue to encourage PO intakes. - RD will follow-up 12/15.  NUTRITION DIAGNOSIS:   Increased nutrient needs related to chronic illness, catabolic illness, cancer and cancer related treatments as evidenced by estimated needs.  GOAL:   Patient will meet greater than or equal to 90% of their needs  MONITOR:   PO intake, Supplement acceptance, Weight trends, Labs, I & O's  REASON FOR ASSESSMENT:   Consult Assessment of nutrition requirement/status  ASSESSMENT:   70 y.o. 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 Presented with severe shortness of breath cannot catch her breath very diminished breath sounds. She had not had any leg swelling PTA. She is concerned that she has re-accumulated fluid. She denies any fever just has occasional chills. Reports sharp chest pain. Cannot lay down flat.  Pt seen for consult. BMI indicates obesity. No intakes documented since admission. Pt reports she had a few bites of egg and sips of coffee for breakfast. She has had abdominal pain/pressure for the past 1 week and decreased appetite with lack of desire to eat during that week. She continues to have a poor appetite since admission. She confirms that SOB does become slightly worse when attempting to eat.   When RD entered room pt reported that she was feeling drowsy and not overly well today so discussion was brief d/t this which pt appreciated. She states she had a chest CT this AM and that she is hopeful that progress is  made to increase her breathing difficulty.   Physical assessment shows no muscle or fat wasting, mild edema to BLE. Per chart review, pt has lost 11 lbs (4.5% body weight) in the past 3 weeks which is significant for time frame. Will continue to monitor weight trends as pt is on IV Lasix BID which may be contributing to weight loss (fluid weight). Pt does not meet criteria for malnutrition at this time with all things taken into consideration but is at high risk.   Medications reviewed; 40 mg IV Lasix BID, 2 g IV Mg sulfate x1 dose yesterday, PRN Zofran, 40 mEq oral KCl x2 doses today, 10 mg Deltasone/day. Labs reviewed; K: 2.9 mmol/L, Cl: 99 mmol/L, Ca: 8.8 mg/dL.   Diet Order:  Diet 2 gram sodium Room service appropriate? Yes; Fluid consistency: Thin  Skin:  Reviewed, no issues  Last BM:  12/12  Height:   Ht Readings from Last 1 Encounters:  10/15/16 '5\' 6"'$  (1.676 m)    Weight:   Wt Readings from Last 1 Encounters:  10/15/16 230 lb 2.6 oz (104.4 kg)    Ideal Body Weight:  59.09 kg  BMI:  Body mass index is 37.15 kg/m.  Estimated Nutritional Needs:   Kcal:  1775-2090 (17-20 kcal/kg)  Protein:  125-135 grams (1.2-1.3 grams/kg)  Fluid:  1.6-1.8 L/day  EDUCATION NEEDS:   No education needs identified at this time    Jarome Matin, MS, RD, LDN, CNSC Inpatient Clinical Dietitian Pager # [phone] After hours/weekend pager # [phone]

## 2016-10-16 NOTE — Care Management Note (Signed)
Case Management Note  Patient Details  Name: [name] MRN: [id number] Date of Birth: 1945-12-05  Subjective/Objective:      Dyspnea and pl. Effusions requiring drainage and hfnc              Action/Plan: Date:  October 16, 2016 Chart reviewed for concurrent status and case management needs. Will continue to follow patient progress. Discharge Planning: following for needs Expected discharge date: [id number] Velva Harman, BSN, [place], [place]  Expected Discharge Date:   (unknown)               Expected Discharge Plan:  [place]  In-House Referral:     Discharge planning Services  CM Consult  Post Acute Care Choice:    Choice offered to:     DME Arranged:    DME Agency:     HH Arranged:    [place] Agency:     Status of Service:  In process, will continue to follow  If discussed at Long Length of Stay Meetings, dates discussed:    Additional Comments:  Leeroy Cha, RN 10/16/2016, 9:36 AM

## 2016-10-16 NOTE — Progress Notes (Signed)
PROGRESS NOTE                                                                                                                                                                                                             Patient Demographics:    [name], is a 70 y.o. female, DOB - [birth year], MHD:[id number]  Admit date - 10/14/2016   Admitting Physician Toy Baker, MD  Outpatient Primary MD for the patient is Merrilee Seashore, MD  LOS - 2  Outpatient Specialists: Dr. Julien Nordmann  Chief Complaint  Patient presents with  . Shortness of Breath  . 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 Patient was being planned for outpatient PET scan. However 3 days after discharge her right place catheter had to be removed because it was damaged when home health nurse attempted to reposition it since it had significant drainage around the tube. I was unable to exchange her to and was discharged home with plan for new Pleurx catheter placement however patient returned to the ED 3 days later with worsening dyspnea.     Subjective:   Patient still reports some shortness of breath. O2 sat maintained on 3 L.   Assessment  & Plan :    Active Problems:   Acute on chronic respiratory failure with hypoxia (HCC) Appears to be multifactorial with right pleural effusion (small pockets of effusion),   COPD symptoms, and her underlying malignancy. Check 2-D echo. Switch Lasix to by  mouth.   Recurrent malignant pleural effusion Recent place catheter placed in but got damaged and unable to exchange by IR. Repeat imaging showing no significant fluid collection. Plan is for possible outpatient Pleurx catheter placement.  Hypokalemia with NSVT Secondary to diuresis. Replenished. Magnesium normal. Continue telemetry monitoring.     Adenocarcinoma of lung, right ([place]) MRI brain negative for metastases. Dr. Julien Nordmann plans on outpatient PET/CT   COPD May have mild exacerbation symptoms. Lungs are clear on  exam today. Continue nebs.  Chronic back pain Continue home medications    Code Status : Full code  Family Communication  : None at bedside  Disposition Plan  : Home pending improvement in her dyspnea. Possibly in the next 24-48 hours  Barriers For Discharge : Active symptoms  Consults  :  IR  Procedures  : CT angiogram of the chest  DVT Prophylaxis  :  SCDs  Lab Results  Component Value Date   PLT 412 (H) 10/16/2016    Antibiotics  :    Anti-infectives    None        Objective:   Vitals:   10/16/16 1202 10/16/16 1400 10/16/16 1455 10/16/16 1544  BP: 134/80     Pulse: 99 91 92   Resp: 20 (!) 22 (!) 24   Temp: 98.4 F (36.9 C)   98.7 F (37.1 C)  TempSrc: Oral   Oral  SpO2: 95% 96% 93%   Weight:      Height:        Wt Readings from Last 3 Encounters:  10/15/16 104.4 kg (230 lb 2.6 oz)  09/24/16 109.6 kg (241 lb 10 oz)  03/09/15 121.6 kg (268 lb)     Intake/Output Summary (Last 24 hours) at 10/16/16 1643 Last data filed at 10/16/16 1544  Gross per 24 hour  Intake              390 ml  Output             1600 ml  Net            -1210 ml     Physical Exam  Gen: not in distress HEENT:  moist mucosa, supple neck Chest: Diminished breath sounds on the right lung, no added sounds CVS: N S1&S2, no murmurs, rubs or gallop GI: soft, NT, ND, Musculoskeletal: warm, no edema     Data Review:    CBC  Recent Labs Lab  10/14/16 1341 10/15/16 0427 10/16/16 0306  WBC 10.9* 8.8 13.8*  HGB 10.6* 10.4* 10.0*  HCT 35.5* 34.4* 33.2*  PLT 374 366 412*  MCV 89.2 89.1 90.2  MCH 26.6 26.9 27.2  MCHC 29.9* 30.2 30.1  RDW 15.5 15.5 15.9*  LYMPHSABS 1.0  --  2.2  MONOABS 0.6  --  1.1*  EOSABS 0.1  --  0.0  BASOSABS 0.0  --  0.0    Chemistries   Recent Labs Lab 10/14/16 1341 10/15/16 0427 10/16/16 0306  NA 139 138 140  K 3.4* 3.9 2.9*  CL 97* 97* 99*  CO2 32 33* 33*  GLUCOSE 148* 172* 114*  BUN '6 13 19  '$ CREATININE 0.56 0.62 0.66  CALCIUM 9.7 9.1 8.8*  MG  --  1.6* 2.1  AST  --  19 14*  ALT  --  21 18  ALKPHOS  --  72 73  BILITOT  --  0.6 0.6   ------------------------------------------------------------------------------------------------------------------ No results for input(s): CHOL, HDL, LDLCALC, TRIG, CHOLHDL, LDLDIRECT in the last 72 hours.  Lab Results  Component Value Date   HGBA1C 6.4 (H) 12/03/2013   ------------------------------------------------------------------------------------------------------------------  Recent Labs  10/15/16 0428  TSH 0.083*   ------------------------------------------------------------------------------------------------------------------ No results for input(s): VITAMINB12, FOLATE, FERRITIN, TIBC, IRON, RETICCTPCT in the last 72 hours.  Coagulation profile No results for input(s): INR, PROTIME in the last 168 hours.  No results for input(s): DDIMER in the last 72 hours.  Cardiac Enzymes  Recent Labs Lab 10/15/16 0021 10/15/16 0427 10/15/16  [place] <0.03 <0.03 <0.03   ------------------------------------------------------------------------------------------------------------------ No results found for: BNP  Inpatient Medications  Scheduled Meds: . atorvastatin  40 mg Oral Daily  . furosemide  40 mg Intravenous BID  . guaiFENesin  600 mg Oral BID  . ipratropium-albuterol  3 mL Nebulization BID  . mometasone-formoterol  2  puff Inhalation BID  . multivitamin with minerals  1 tablet Oral Daily  . predniSONE  10 mg Oral q morning - 10a  . pregabalin  200 mg Oral Daily  . protein supplement shake  11 oz Oral BID BM  . rOPINIRole  1 mg Oral QHS  . sodium chloride flush  3 mL Intravenous Q12H  . sodium chloride flush  3 mL Intravenous Q12H   Continuous Infusions: . sodium chloride Stopped (10/15/16 1307)   PRN Meds:.sodium chloride, acetaminophen **OR** acetaminophen, albuterol, HYDROcodone-acetaminophen, ketorolac, lip balm, ondansetron **OR** ondansetron (ZOFRAN) IV, oxyCODONE-acetaminophen **AND** oxyCODONE, sodium chloride flush  Micro Results Recent Results (from the past 240 hour(s))  MRSA PCR Screening     Status: None   Collection Time: 10/15/16  1:20 PM  Result Value Ref Range Status   MRSA by PCR NEGATIVE NEGATIVE Final    Comment:        The GeneXpert MRSA Assay (FDA approved for NASAL specimens only), is one component of a comprehensive MRSA colonization surveillance program. It is not intended to diagnose MRSA infection nor to guide or monitor treatment for MRSA infections.     Radiology Reports Dg Chest 1 View  Result Date: 10/01/2016 CLINICAL DATA:  Post right thoracentesis EXAM: CHEST 1 VIEW COMPARISON:  09/28/2016 FINDINGS: Decreasing right pleural effusion following thoracentesis. No pneumothorax. Small right effusion and right base atelectasis persists. Heart is borderline in size. Left lung is clear. IMPRESSION: No pneumothorax following right thoracentesis. Small residual right effusion with right base atelectasis. Electronically Signed   By: Rolm Baptise M.D.   On: 10/01/2016 14:33   Dg Chest 1 View  Result Date: 09/25/2016 CLINICAL DATA:  Status post right thoracentesis. EXAM: CHEST 1 VIEW COMPARISON:  Radiographs of September 24, 2016. FINDINGS: Stable cardiomegaly. Atherosclerosis of thoracic aorta is noted. No pneumothorax is noted. Right pleural effusion appears to have  resolved. No acute pulmonary disease is noted. Bony thorax is unremarkable. IMPRESSION: Aortic atherosclerosis. Right pleural effusion has resolved status post thoracentesis. No pneumothorax is noted. Electronically Signed   By: Marijo Conception, M.D.   On: 09/25/2016 16:44   Dg Chest 2 View  Result Date: 10/16/2016 CLINICAL DATA:  Follow-up pleural effusion . EXAM: CHEST  2 VIEW COMPARISON:  CT 10/14/2016.  Chest x-ray 10/14/2016 09/25/2016. FINDINGS: Mediastinum and hilar structures are normal. Heart size normal. Right lower lobe atelectasis and infiltrate. Small right pleural effusion. No acute bony abnormality identified. IMPRESSION: Right lower lobe atelectasis and infiltrate again noted. With small right pleural effusion noted. Similar findings noted on prior exam. No pneumothorax. Electronically Signed   By: Marcello Moores  Register   On: 10/16/2016 10:32   Dg Chest 2 View  Result Date: 10/14/2016 CLINICAL DATA:  Left-sided breast pain and shortness of breath. History of lung cancer. EXAM: CHEST  2 VIEW COMPARISON:  Chest radiograph 10/07/2016 and chest CT 09/24/2016 FINDINGS: Cardiomediastinal contours are unchanged. Small right pleural effusion with loculated component along the right lateral hemithorax is slightly decreased in size. Multiple nodular opacities overlying the right lung are unchanged. There is atherosclerotic calcification within the aortic arch. No pneumothorax, focal airspace consolidation or pulmonary edema. IMPRESSION:  1. No acute cardiopulmonary disease. 2. Decreased size of partially loculated right pleural effusion and unchanged multifocal nodular opacities of the right hemithorax. 3. Aortic atherosclerosis. Electronically Signed   By: Ulyses Jarred M.D.   On: 10/14/2016 14:12   Dg Chest 2 View  Result Date: 10/07/2016 CLINICAL DATA:  Recurrent right pleural and fusion associated with shortness of breath and chest pain. History of COPD, former smoker, diabetes. Patient also has a  history of right-sided breast malignancy. EXAM: CHEST  2 VIEW COMPARISON:  Chest x-rays of October 04, 2016, September 24, 2016, and CT scan of the chest of September 24, 2016. FINDINGS: The left lung is mildly hyperinflated. There is no left-sided pleural effusion. On the right there is a small pleural effusion which has increased slightly in size over the past 3 days. There is no pneumothorax. There is stable ovoid density projecting over the posterior aspect of the right seventh rib and patchy interstitial density more inferiorly. Is calcification in the wall of the aortic arch. The heart and pulmonary vascularity are normal. The bony thorax exhibits no acute abnormality. The heart and pulmonary vascularity are normal. The mediastinum is normal in width. IMPRESSION: Slight interval increase in the volume of the small right pleural effusion. Stable underlying chronic bronchitic changes. Patchy density in the right mid lung is stable. Thoracic aortic atherosclerosis. Electronically Signed   By: David  Martinique M.D.   On: 10/07/2016 09:41   Dg Chest 2 View  Result Date: 10/04/2016 CLINICAL DATA:  Shortness of breath.  Ex-smoker. EXAM: CHEST  2 VIEW COMPARISON:  10/01/2016. FINDINGS: Increasing RIGHT pleural effusion. Compressive atelectasis at the RIGHT base but no definite consolidation. Trace LEFT pleural effusion. No pneumothorax. Normal heart size. IMPRESSION: Beginning re-accumulation of RIGHT pleural effusion post thoracentesis. Electronically Signed   By: Staci Righter M.D.   On: 10/04/2016 10:28   Dg Chest 2 View  Result Date: 09/24/2016 CLINICAL DATA:  Shortness of breath and chest pain EXAM: CHEST  2 VIEW COMPARISON:  December 05, 2013 FINDINGS: There is a right pleural effusion with right base atelectasis. Lungs elsewhere are clear. Heart size and pulmonary vascularity are normal. No adenopathy. There is atherosclerotic calcification in the aorta. No bone lesions. IMPRESSION: Right pleural effusion  with right base atelectasis. Lungs elsewhere clear. There is aortic atherosclerosis. Electronically Signed   By: Lowella Grip III M.D.   On: 09/24/2016 13:20   Ct Angio Chest Pe W Or Wo Contrast  Result Date: 10/14/2016 CLINICAL DATA:  Short of breath. Pleural drainage catheter removed 1 week prior. EXAM: CT ANGIOGRAPHY CHEST WITH CONTRAST TECHNIQUE: Multidetector CT imaging of the chest was performed using the standard protocol during bolus administration of intravenous contrast. Multiplanar CT image reconstructions and MIPs were obtained to evaluate the vascular anatomy. CONTRAST:  70 mL Isovue COMPARISON:  CT 09/24/2016 FINDINGS: Cardiovascular: Coronary artery calcification and aortic atherosclerotic calcification. No pericardial fluid. Mediastinum/Nodes: No axillary or supraclavicular adenopathy. No mediastinal adenopathy. Lungs/Pleura: There are multiple small pockets of loculated pleural fluid along the posterior aspect of the RIGHT hemithorax. Interval reduction of moderate dependent effusion effusion seen on comparison exam. Some fluid extends along the fissure. No pneumothorax. Rounded nodule in the anterior RIGHT middle lobe measuring 9 mm (image 63, series 11. Nodule in the anterior RIGHT upper lobe additionally measuring 16 mm on image 44, series. These 2 pulmonary nodules are not changed comparison exam. LEFT lung clear Upper Abdomen: Limited view of the liver, kidneys, pancreas are unremarkable. Normal adrenal  glands. Musculoskeletal: No aggressive osseous lesion. Review of the MIP images confirms the above findings. IMPRESSION: 1. Multiple small pockets of loculated pleural fluid within the RIGHT hemithorax replaces the previous seen moderate pleural effusion. 2. Stable pulmonary nodules the RIGHT upper lobe and RIGHT middle lobe of undetermined etiology. 3. No pneumothorax Electronically Signed   By: Suzy Bouchard M.D.   On: 10/14/2016 17:00   Ct Angio Chest Pe W Or Wo  Contrast  Result Date: 09/25/2016 CLINICAL DATA:  Subacute onset of right-sided chest pain. Increasing respiratory distress. Leukocytosis and elevated D-dimer. Initial encounter. EXAM: CT ANGIOGRAPHY CHEST WITH CONTRAST TECHNIQUE: Multidetector CT imaging of the chest was performed using the standard protocol during bolus administration of intravenous contrast. Multiplanar CT image reconstructions and MIPs were obtained to evaluate the vascular anatomy. CONTRAST:  100 mL of Isovue 370 IV contrast COMPARISON:  None. FINDINGS: Cardiovascular: There is no evidence of significant pulmonary embolus. Evaluation for pulmonary embolus is suboptimal in areas of airspace consolidation and due to motion artifact. Scattered calcification is noted along the aortic arch and descending thoracic aorta. The heart remains borderline normal in size. Scattered coronary artery calcifications are seen. The great vessels are unremarkable in appearance. Mediastinum/Nodes: Visualized mediastinal nodes remain normal in size. No pericardial effusion is identified. Bilateral hypoattenuating nodules are noted within the thyroid gland, measuring up to 1.7 cm on the left. No axillary lymphadenopathy is seen. The patient is status post right-sided mastectomy. Lungs/Pleura: A moderate right-sided pleural effusion is noted, with partial consolidation at the right lung base. There is a 1.7 x 1.0 cm pleural-based nodule anteriorly at the right middle lobe (image 49 of 95). Malignancy cannot be excluded. No pneumothorax is seen. Upper Abdomen: The visualized portions of the liver and spleen are grossly unremarkable. The visualized portions of the adrenal glands are within normal limits. Musculoskeletal: No acute osseous abnormalities are identified. An hemangioma is noted at vertebral body T9. The visualized musculature is unremarkable in appearance. Review of the MIP images confirms the above findings. IMPRESSION: 1. No evidence of significant  pulmonary embolus. 2. Moderate right-sided pleural effusion, with partial consolidation at the right lung base. This could reflect pneumonia. 3. Apparent 1.7 x 1.0 cm pleural-based nodule anteriorly at the right middle lobe. Malignancy cannot be excluded. PET/CT would be helpful for further evaluation. Alternatively, diagnostic thoracentesis might be considered. 4. **An incidental finding of potential clinical significance has been found. 1.7 cm hypoattenuating nodule at the left thyroid lobe. Consider further evaluation with thyroid ultrasound. If patient is clinically hyperthyroid, consider nuclear medicine thyroid uptake and scan.** 5. Scattered coronary artery calcifications seen. 6. 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 Electronically Signed   By: Garald Balding M.D.   On: 09/25/2016 00:58   Mr Jeri Cos YH Contrast  Result Date: 10/04/2016 CLINICAL DATA:  New diagnosis of lung cancer. History of breast cancer, hypertension rheumatoid arthritis. Assess for intracranial metastasis. EXAM: MRI HEAD WITHOUT AND WITH CONTRAST TECHNIQUE: Multiplanar, multiecho pulse sequences of the brain and surrounding structures were obtained without and with intravenous contrast. CONTRAST:  70m MULTIHANCE GADOBENATE DIMEGLUMINE 529 MG/ML IV SOLN COMPARISON:  None. FINDINGS: Mild motion degraded examination. INTRACRANIAL CONTENTS: No reduced diffusion to suggest acute ischemia or hypercellular tumor. No susceptibility artifact to suggest hemorrhage. The ventricles and sulci are normal for patient's age. Patchy supratentorial white matter FLAIR T2 hyperintensities are less than expected for age, compatible with chronic small vessel ischemic disease. No suspicious parenchymal signal, masses, mass effect. No abnormal intraparenchymal  or extra-axial enhancement. No abnormal extra-axial fluid collections. No extra-axial masses. VASCULAR: Normal major intracranial vascular flow voids present at skull base. SKULL AND UPPER CERVICAL  SPINE: No abnormal sellar expansion. No suspicious calvarial bone marrow signal. Craniocervical junction maintained. Small RIGHT exostosis. SINUSES/ORBITS: RIGHT maxillary mucosal retention cyst. Small RIGHT mastoid effusion.The included ocular globes and orbital contents are non-suspicious. OTHER: None. IMPRESSION: No intracranial metastasis. Negative motion degraded MRI head with and without contrast for age. Electronically Signed   By: Elon Alas M.D.   On: 10/04/2016 18:35   Ir Guided Niel Hummer W Catheter Placement  Result Date: 10/07/2016 CLINICAL DATA:  History of lung cancer, now with recurrent symptomatic right-sided malignant effusion. Please perform tunneled right-sided pleural drainage catheter for palliative purposes. EXAM: INSERTION OF TUNNELED RIGHT SIDED PLEURAL DRAINAGE CATHETER COMPARISON:  Ultrasound-guided thoracentesis - 10/01/2016; 09/25/2016; chest radiograph - 10/07/2016 MEDICATIONS: Ancef 2 gm IV; Antibiotic was administered in an appropriate time interval for the procedure. ANESTHESIA/SEDATION: Moderate (conscious) sedation was employed during this procedure. A total of Versed 2 mg and Fentanyl 50 mcg was administered intravenously. Moderate Sedation Time: 14 minutes. The patient's level of consciousness and vital signs were monitored continuously by radiology nursing throughout the procedure under my direct supervision. FLUOROSCOPY TIME:  FLUOROSCOPY TIME 36 seconds (83.3 mGy) COMPLICATIONS: None immediate. PROCEDURE: The procedure, risks, benefits, and alternatives were explained to the patient, who wish to proceed with the placement of this permanent pleural catheter as the patient is seeking palliative care. The patient understand and consent to the procedure. The right inferior lateral chest and upper abdomen were prepped with Chlorhexidine in a sterile fashion, and a sterile drape was applied covering the operative field. A sterile gown and sterile gloves were used for the  procedure. Initial ultrasound scanning and fluoroscopic imaging demonstrates a recurrent moderate to large pleural effusion. Under direct ultrasound guidance, the right inferior lateral pleural space was accessed with a Yueh sheath needle after the overlying soft tissues were anesthetized with 1% lidocaine with epinephrine. An Amplatz super stiff wire was then advanced under fluoroscopy into the pleural space. A 15.5 French tunneled Pleur-X catheter was tunneled from an incision within the right upper abdominal quadrant to the access site. The pleural access site was serially dilated under fluoroscopy, ultimately allowing placement of a peel-away sheath. The catheter was advanced through the peel-away sheath. The sheath was then removed. Final catheter positioning was confirmed with a fluoroscopic radiographic image. The access incision was closed with subcutaneous subcuticular 4-0 Vicryl, Dermabond and Steri-Strips. A Prolene retention suture was applied at the catheter exit site. Large volume thoracentesis was performed through the new catheter utilizing provided bulb vacuum assisted drainage bag. The patient tolerated the above procedure well without immediate postprocedural complication. FINDINGS: Preprocedural ultrasound scanning demonstrates a recurrent moderate sized right sided pleural effusion. After ultrasound and fluoroscopic guided placement, the catheter is coiled within the caudal aspect of the right hemi thorax at the location of the patient's recurrent malignant effusion. Following catheter placement, approximately 1.3 L of serous though slightly blood tinged pleural fluid was removed. IMPRESSION: Successful placement of permanent, tunneled right pleural drainage catheter via lateral approach. Approximately 1.3 L of serous though slightly blood tinged pleural fluid was removed after catheter placement. Electronically Signed   By: Sandi Mariscal M.D.   On: 10/07/2016 16:42   Dg Chest Port 1  View  Result Date: 09/28/2016 CLINICAL DATA:  Productive cough today EXAM: PORTABLE CHEST 1 VIEW COMPARISON:  09/25/2016 FINDINGS: There is new  airspace opacity obscuring the right hemidiaphragm. Atherosclerotic calcification of the aortic arch. The left lung appears clear.  Upper normal heart size. IMPRESSION: 1. Obscuration of the right hemidiaphragm with some blunting of the right lateral costophrenic angle. This could reflect recurrent right pleural effusion with passive atelectasis although right basilar pneumonia is not readily excluded. 2. Atherosclerotic aortic arch. Electronically Signed   By: Van Clines M.D.   On: 09/28/2016 10:26   US Thyroid  Result Date: 09/27/2016 CLINICAL DATA:  Incidental on CT.  Thyroid nodule seen on CT. EXAM: THYROID ULTRASOUND TECHNIQUE: Ultrasound examination of the thyroid gland and adjacent soft tissues was performed. COMPARISON:  Chest CT 09/24/2016 FINDINGS: Parenchymal Echotexture: Mildly heterogenous Estimated total number of nodules >/= 1 cm: 3 Number of spongiform nodules >/=  2 cm not described below (TR1): 0 Number of mixed cystic and solid nodules >/= 1.5 cm not described below (TR2): 0 _________________________________________________________ Isthmus: Measures 0.4 cm in thickness. No discrete nodules are identified within the thyroid isthmus. _________________________________________________________ Right lobe: Measures 4.2 x 1.6 x 2.0 cm. Nodule # 1: Location: Isthmus; Superior Size: 1.6 x 1.3 x 1.4 cm. Composition: solid/almost completely solid (2), there is an eccentric cystic component. Echogenicity: isoechoic (1) Shape: not taller-than-wide (0) Margins: ill-defined (0) Echogenic foci: none (0) ACR TI-RADS total points: 3. ACR TI-RADS risk category: TR3 (3 points). ACR TI-RADS recommendations: *Given size (>/= 1.5 - 2.4 cm) and appearance, a follow-up ultrasound in 1 year should be considered based on TI-RADS criteria. Nodule # 2: Location: Right;  Inferior Size: 0.6 x 0.4 x 0.5 cm Composition: solid/almost completely solid (2) Echogenicity: hypoechoic (2) Shape: not taller-than-wide (0) Margins: ill-defined (0) Echogenic foci: none (0) ACR TI-RADS total points: 4. ACR TI-RADS risk category: TR4 (4-6 points). ACR TI-RADS recommendations: Given size (<0.9 cm) and appearance, this nodule does NOT meet TI-RADS criteria for biopsy or dedicated follow-up. _________________________________________________________ Left lobe: Measures 4.1 x 1.6 x 1.9 cm. Nodule # 3: Location: Left; Superior Size: 1.0 x 1.0 x 1.0 cm. Composition: mixed cystic and solid (1) Echogenicity: hypoechoic (2) Shape: not taller-than-wide (0) Margins: smooth (0) Echogenic foci: none (0) ACR TI-RADS total points: 3. ACR TI-RADS risk category: TR3 (3 points). ACR TI-RADS recommendations: Given size (<1.4 cm) and appearance, this nodule does NOT meet TI-RADS criteria for biopsy or dedicated follow-up. There is a cystic structure in the superior left thyroid lobe measuring 1.0 x 0.7 x 0.7 cm. IMPRESSION: Solid and cystic nodules in the thyroid. Largest nodule measures 1.6 cm in the superior right lobe. This nodule meets criteria for 1 year follow-up. The above is in keeping with the ACR TI-RADS recommendations - J Am Coll Radiol 2017;14:587-595. Electronically Signed   By: Markus Daft M.D.   On: 09/27/2016 07:59   Ir Removal Of Plural Cath W/cuff  Result Date: 10/10/2016 INDICATION: 70 year old female with a history of malignant right-sided pleural effusion and PleurX catheter placed 10/07/2016. She presents today for malfunctioning catheter. EXAM: IR REMOVAL OF PLEURAL CATH WITH CUFF MEDICATIONS: The patient is currently admitted to the hospital and receiving intravenous antibiotics. The antibiotics were administered within an appropriate time frame prior to the initiation of the procedure. ANESTHESIA/SEDATION: None COMPLICATIONS: None PROCEDURE: Informed written consent was obtained from the  patient after a thorough discussion of the procedural risks, benefits and alternatives. All questions were addressed. Maximal Sterile Barrier Technique was utilized including caps, mask, sterile gowns, sterile gloves, sterile drape, hand hygiene and skin antiseptic. A timeout was performed prior to  the initiation of the procedure. Patient was positioned supine position on the fluoroscopy table. 1% lidocaine was used for local anesthesia. Spot images of the chest were performed. Contrast injection was performed of the catheter. Glidewire was advanced through the catheter in attempt to reposition. Retention suture was ligated in order to Dermabond the skin. Catheter was damaged during the manipulation requiring attempt at exchange. Stiff wire was advanced through the catheter which was removed from the stiff wire. Combination of the peel-away sheath, Kumpe catheter were used in order to stiff in the tract an allow passage of the new catheter over the wiring catheter combination. Exchange of the catheter failed, with inability to passed through the soft tissue tract and enter the pleura. Catheter was removed and dressing was placed. Patient tolerated the procedure well and remained hemodynamically stable throughout. No complications were encountered and no significant blood loss encountered. FINDINGS: Initial image demonstrates catheter coiled at the base of the lung, potentially subpulmonic. Contrast injection demonstrates contrast accumulating within the pleural is a space. Wire manipulation failed to manipulate the catheter to more superior position. Images demonstrate failed catheter exchange given the damaged catheter. IMPRESSION: Status post failed attempt at exchange of a tunneled right chest PleurX catheter. Signed, Dulcy Fanny. Earleen Newport, DO [facility] Radiology PLAN: The patient will return for a scheduled visit at the [facility] with the chest x-ray to determine  need for new PleurX catheter placement versus potential repeat thoracentesis preceding her oncology plan. Electronically Signed   By: Corrie Mckusick D.O.   On: 10/10/2016 16:52   US Thoracentesis Asp Pleural Space W/img Guide  Result Date: 10/01/2016 INDICATION: Recurrent right pleural effusion of unknown etiology. Request for diagnostic and therapeutic thoracentesis. EXAM: ULTRASOUND GUIDED DIAGNOSTIC AND THERAPEUTIC THORACENTESIS MEDICATIONS: 1% Lidocaine COMPLICATIONS: None immediate. PROCEDURE: An ultrasound guided thoracentesis was thoroughly discussed with the patient and questions answered. The benefits, risks, alternatives and complications were also discussed. The patient understands and wishes to proceed with the procedure. Written consent was obtained. Ultrasound was performed to localize and mark an adequate pocket of fluid in the right chest. The area was then prepped and draped in the normal sterile fashion. 1% Lidocaine was used for local anesthesia. Under ultrasound guidance a Safe-T-Centesis catheter was introduced. Thoracentesis was performed. The catheter was removed and a dressing applied. FINDINGS: A total of approximately 1.2L of serosanguineous fluid was removed. Samples were sent to the laboratory as requested by the clinical team. IMPRESSION: Successful ultrasound guided right thoracentesis yielding 1.2L of pleural fluid. Read by: Saverio Danker, PA-C Electronically Signed   By: Lucrezia Europe M.D.   On: 10/01/2016 14:31   US Thoracentesis Asp Pleural Space W/img Guide  Result Date: 09/25/2016 INDICATION: Ex-smoker with remote history of breast cancer, COPD, rheumatoid arthritis, dyspnea, pneumonia, right pleural effusion. Request made for diagnostic and therapeutic right thoracentesis. EXAM: ULTRASOUND GUIDED DIAGNOSTIC AND THERAPEUTIC RIGHT THORACENTESIS MEDICATIONS: None. COMPLICATIONS: None immediate. PROCEDURE: An ultrasound guided thoracentesis was thoroughly discussed with the  patient and questions answered. The benefits, risks, alternatives and complications were also discussed. The patient understands and wishes to proceed with the procedure. Written consent was obtained. Ultrasound was performed to localize and mark an adequate pocket of fluid in the right chest. The area was then prepped and draped in the normal sterile fashion. 1% Lidocaine was used for local anesthesia. Under ultrasound guidance a Safe-T-Centesis catheter was introduced. Thoracentesis was performed. The catheter was removed and a dressing applied. FINDINGS: A total  of approximately 1.5 liters of hazy, amber fluid was removed. Samples were sent to the laboratory as requested by the clinical team. IMPRESSION: Successful ultrasound guided diagnostic and therapeutic right thoracentesis yielding 1.5 liters of pleural fluid. Read by: Rowe Robert, PA-C Electronically Signed   By: Marybelle Killings M.D.   On: 09/25/2016 16:15    Time Spent in minutes  25   Louellen Molder M.D on 10/16/2016 at 4:43 PM  Between 7am to 7pm - Pager - [phone]  After 7pm go to [email or web address] - password [facility]  [facility] -  Office  [phone]

## 2016-10-17 ENCOUNTER — Inpatient Hospital Stay (HOSPITAL_COMMUNITY): Payer: Medicare Other

## 2016-10-17 DIAGNOSIS — J441 Chronic obstructive pulmonary disease with (acute) exacerbation: Secondary | ICD-10-CM

## 2016-10-17 DIAGNOSIS — J90 Pleural effusion, not elsewhere classified: Secondary | ICD-10-CM

## 2016-10-17 DIAGNOSIS — I10 Essential (primary) hypertension: Secondary | ICD-10-CM

## 2016-10-17 LAB — BASIC METABOLIC PANEL
Anion gap: 10 (ref 5–15)
BUN: 14 mg/dL (ref 6–20)
CHLORIDE: 101 mmol/L (ref 101–111)
CO2: 28 mmol/L (ref 22–32)
Calcium: 9 mg/dL (ref 8.9–10.3)
Creatinine, Ser: 0.63 mg/dL (ref 0.44–1.00)
GFR calc Af Amer: >60 mL/min (ref >60)
GLUCOSE: 114 mg/dL — AB (ref 65–99)
POTASSIUM: 3.8 mmol/L (ref 3.5–5.1)
Sodium: 139 mmol/L (ref 135–145)

## 2016-10-17 LAB — ECHOCARDIOGRAM COMPLETE
Height: 66 in
Weight: 3682.56 oz

## 2016-10-17 MED ORDER — BISOPROLOL FUMARATE 5 MG PO TABS
10.0000 mg | ORAL_TABLET | Freq: Every day | ORAL | Status: DC
  Administered 2016-10-17: 10 mg via ORAL
  Filled 2016-10-17: qty 2

## 2016-10-17 MED ORDER — BISOPROLOL FUMARATE 10 MG PO TABS: 10.0000 mg | ORAL_TABLET | Freq: Every day | ORAL | 0 refills | Status: AC

## 2016-10-17 MED ORDER — DILTIAZEM HCL ER COATED BEADS 120 MG PO CP24: 120.0000 mg | ORAL_CAPSULE | Freq: Every day | ORAL | Status: DC

## 2016-10-17 MED ORDER — FUROSEMIDE 40 MG PO TABS: 40.0000 mg | ORAL_TABLET | Freq: Every day | ORAL | 0 refills | Status: AC

## 2016-10-17 MED ORDER — GUAIFENESIN ER 600 MG PO TB12: 600.0000 mg | ORAL_TABLET | Freq: Two times a day (BID) | ORAL | 0 refills | Status: AC

## 2016-10-17 NOTE — Evaluation (Signed)
Occupational Therapy Evaluation and Discharge Summary Patient Details Name: [name] MRN: [id number] DOB: [birth year] Today's Date: 10/17/2016    History of Present Illness 70 yo female admitted with acute on chronic respiratory failure with hypoxia. Hx of lung ca, recurrent pleural effusion, COPD, RA   Clinical Impression   Pt seen for the above diagnosis and has the deficits listed below. Pt would benefit from [facility] to continue addressing energy conservation techniques in her own environment.  Pt on 3L of O2 with O2 sats at 88% when walking and returning to 91% when sitting.  Pt now has home O2 but states she fatigues quickly at home.  Pt is overall supervision with most adls in her room but will have more difficulty when things are not close in proximity like they are in the hospital.      Follow Up Recommendations  Home health OT;Supervision/Assistance - 24 hour    Equipment Recommendations  None recommended by OT    Recommendations for Other Services       Precautions / Restrictions Precautions Precautions: Fall Precaution Comments: monitor O2 Restrictions Weight Bearing Restrictions: No      Mobility Bed Mobility Overal bed mobility: Modified Independent                Transfers Overall transfer level: Needs assistance Equipment used: Rolling walker (2 wheeled) Transfers: Sit to/from Stand Sit to Stand: Min guard         General transfer comment: cues for hand placement.  Pt does not have a controlled descent into the chair.    Balance Overall balance assessment: Needs assistance Sitting-balance support: Feet supported Sitting balance-Leahy Scale: Good     Standing balance support: Bilateral upper extremity supported Standing balance-Leahy Scale: Fair Standing balance comment: Pt could let go of walker for short spurts while grooming but anthing longer than a few moments is unsafe.                            ADL Overall ADL's :  Needs assistance/impaired Eating/Feeding: Independent;Sitting   Grooming: Wash/dry hands;Wash/dry face;Oral care;Supervision/safety;Standing   Upper Body Bathing: Set up;Sitting   Lower Body Bathing: Supervison/ safety;Sit to/from stand   Upper Body Dressing : Set up;Sitting   Lower Body Dressing: Supervision/safety;Sit to/from stand   Toilet Transfer: Minimal assistance;Ambulation;Regular Toilet;Grab bars;RW Armed forces technical officer Details (indicate cue type and reason): Pt requires cues for controlled decent when sitting Toileting- Clothing Manipulation and Hygiene: Min guard;Sit to/from stand       Functional mobility during ADLs: Min guard;Rolling walker General ADL Comments: Pt able to do most adls without physical assist.  Pt gets very winded and her O2 sats drop.  Encouraged pursed lip breathing and spoke at length with pt about how she can save energy at home.  Handout provided.  Feel HHOT may be able to assist her in evaluating her own environment to see how it can be set up better to accomodate her poor activity tolerance.     Vision Vision Assessment?: No apparent visual deficits   Perception     Praxis      Pertinent Vitals/Pain Pain Assessment: 0-10 Pain Score: 7  Pain Location: back Pain Descriptors / Indicators: Aching Pain Intervention(s): Limited activity within patient's tolerance;Monitored during session;Patient requesting pain meds-RN notified;RN gave pain meds during session;Repositioned     Hand Dominance Right   Extremity/Trunk Assessment Upper Extremity Assessment Upper Extremity Assessment: Overall WFL for tasks assessed  Lower Extremity Assessment Lower Extremity Assessment: Defer to PT evaluation   Cervical / Trunk Assessment Cervical / Trunk Assessment: Kyphotic   Communication Communication Communication: No difficulties   Cognition Arousal/Alertness: Awake/alert Behavior During Therapy: WFL for tasks assessed/performed Overall Cognitive  Status: Within Functional Limits for tasks assessed                     General Comments       Exercises       Shoulder Instructions      Home Living Family/patient expects to be discharged to:: Private residence Living Arrangements: Other relatives Available Help at Discharge: Family Type of Home: Apartment Home Access: Stairs to enter Technical brewer of Steps: 1 flight Entrance Stairs-Rails: Left Home Layout: One level     Bathroom Shower/Tub: Tub/shower unit Shower/tub characteristics: Curtain Biochemist, clinical: Standard     Home Equipment: Environmental consultant - 2 wheels;Cane - single point;Bedside commode          Prior Functioning/Environment Level of Independence: Independent with assistive device(s)        Comments: uses cane vs RW (RW more so now)        OT Problem List: Decreased activity tolerance;Cardiopulmonary status limiting activity;Decreased knowledge of use of DME or AE   OT Treatment/Interventions: Self-care/ADL training;Therapeutic activities;DME and/or AE instruction    OT Goals(Current goals can be found in the care plan section) Acute Rehab OT Goals Patient Stated Goal: return to independence. home OT Goal Formulation: All assessment and education complete, DC therapy  OT Frequency:     Barriers to D/C:            Co-evaluation              End of Session Equipment Utilized During Treatment: Rolling walker;Oxygen Nurse Communication: Mobility status  Activity Tolerance: Patient limited by fatigue Patient left: in bed;with call bell/phone within reach;with family/visitor present   Time: 9470-9628 OT Time Calculation (min): 22 min Charges:  OT General Charges $OT Visit: 1 Procedure OT Evaluation $OT Eval Moderate Complexity: 1 Procedure G-Codes:    Glenford Peers 2016-10-24, 1:31 PM  [phone]

## 2016-10-17 NOTE — Progress Notes (Signed)
  Echocardiogram 2D Echocardiogram has been performed.  [name] M 10/17/2016, 11:44 AM

## 2016-10-17 NOTE — Discharge Summary (Signed)
Physician Discharge Summary  [name] QIH:[id number] DOB: 08/26/1946 DOA: 10/14/2016  PCP: Merrilee Seashore, MD  Admit date: 10/14/2016 Discharge date: 10/17/2016  Admitted From: Home Disposition:  Home  Recommendations for Outpatient Follow-up:  Follow up with oncology and interventional radiology in 1 week.   Home Health: RN Equipment/Devices: Home O2 (3 L continuously)  Discharge Condition: Guarded CODE STATUS: Full code Diet recommendation: Heart healthy    Discharge Diagnoses:  Principal Problem:   Acute on chronic respiratory failure with hypoxia (HCC)   Active Problems:   COPD GOLD II   Exudative pleural effusion   Essential hypertension   Hypokalemia   Malignant pleural effusion   Adenocarcinoma of lung, right (HCC)   COPD exacerbation (HCC)   Pleural effusion   Brief narrative/history of present illness Please refer to admission H&P for details, in brief,70 year old female with recently diagnosed adenocarcinoma of the lung with recurrent right pleural effusion, recently discharge home with right Pleurx catheter placement, COPD on home O2 (liters), history of right breast malignancy status post lumpectomy and radiation remotely, hypertension and recently diagnosed rheumatoid arthritis (on weekly methotrexate and daily prednisone) presented to the [facility] ED with shortness of breath. Patient was being planned for outpatient PET scan. However 3 days after discharge her right place catheter had to be removed because it was damaged when home health nurse attempted to reposition it since it had significant drainage around the tube. I was unable to exchange her to and was discharged home with plan for new Pleurx catheter placement however patient returned to the ED 3 days later with worsening dyspnea.  Hospital course  Active Problems:   Acute on chronic respiratory failure with hypoxia (HCC) Appears to be multifactorial with right pleural effusion (small  pockets of effusion),   COPD symptoms, and her underlying malignancy. Received IV Lasix for diuresis. Symptoms improving. 2-D echo with vigorous EF of 65-70% and grade 1 diastolic dysfunction. Added bisoprolol (also given frequent NSVT on telemetry). Will discharge in order Lasix 40 mg daily. Hold HCTZ and ARB. Continue home O2 and home inhalers.    Recurrent malignant pleural effusion Recent pleurx catheter placed in but got damaged and unable to exchange by IR. Repeat imaging showing no significant fluid collection. Plan is for possible outpatient Pleurx catheter placement. (Likely next week). IR notified.  Hypokalemia with NSVT Secondary to diuresis. Replenished. Magnesium normal. Added bisoprolol.     Adenocarcinoma of lung, right ([place]) MRI brain negative for metastases. Dr. Julien Nordmann plans on outpatient PET/CT   COPD May have mild exacerbation symptoms. Lungs are clear on exam today. Continue nebs.  Chronic back pain Continue home medications      Family Communication  : None at bedside  Disposition Plan  : Home   Consults  :  IR  Procedures  : CT angiogram of the chest    Recent Labs       Lab Results  Component Value Date   PLT 412 (H) 10/16/2016      Antibiotics  :       Anti-infectives    None    Discharge Instructions   Allergies as of 10/17/2016   No Known Allergies     Medication List    STOP taking these medications   irbesartan-hydrochlorothiazide 300-12.5 MG tablet Commonly known as:  AVALIDE     TAKE these medications   albuterol 108 (90 Base) MCG/ACT inhaler Commonly known as:  PROVENTIL HFA;VENTOLIN HFA Inhale 2 puffs into the lungs every 6 (six)  hours as needed for wheezing or shortness of breath.   atorvastatin 40 MG tablet Commonly known as:  LIPITOR Take 40 mg by mouth daily.   bisoprolol 10 MG tablet Commonly known as:  ZEBETA Take 1 tablet (10 mg total) by mouth daily. Start taking on:   10/18/2016   feeding supplement (ENSURE ENLIVE) Liqd Take 237 mLs by mouth 2 (two) times daily between meals.   FOLIC ACID PO Take 1 mg by mouth daily.   furosemide 40 MG tablet Commonly known as:  LASIX Take 1 tablet (40 mg total) by mouth daily. Start taking on:  10/18/2016   guaiFENesin 600 MG 12 hr tablet Commonly known as:  MUCINEX Take 1 tablet (600 mg total) by mouth 2 (two) times daily.   methotrexate 2.5 MG tablet Commonly known as:  RHEUMATREX Take 15 mg by mouth once a week. Every Tuesday.   oxyCODONE-acetaminophen 10-325 MG tablet Commonly known as:  PERCOCET Take 1 tablet by mouth every 6 (six) hours as needed for pain. What changed:  when to take this   predniSONE 5 MG tablet Commonly known as:  DELTASONE Take 10 mg by mouth every morning.   pregabalin 200 MG capsule Commonly known as:  LYRICA Take 200 mg by mouth daily.   SYMBICORT 160-4.5 MCG/ACT inhaler Generic drug:  budesonide-formoterol USE 2 PUFFS FIRST THING IN THE MORNING AND 2 PUFFS ABOUT 12 HOURS LATER            Durable Medical Equipment        Start     Ordered   10/17/16 1032  For home use only DME 4 wheeled rolling walker with seat  Once    Question:  Patient needs a walker to treat with the following condition  Answer:  Chronic respiratory failure with hypoxia ([place])   10/17/16 1032     Follow-up Information    MOHAMED,MOHAMED K., MD Follow up.   Specialty:  Oncology Why:  as scheduled Contact information: [place] [address] [phone]        Sandi Mariscal, MD. Call in 1 week(s).   Specialty:  Interventional Radiology Contact information: [address] [place] [address] [phone]          No Known Allergies   Procedures/Studies: Dg Chest 1 View  Result Date: 10/01/2016 CLINICAL DATA:  Post right thoracentesis EXAM: CHEST 1 VIEW COMPARISON:  09/28/2016 FINDINGS: Decreasing right pleural effusion following  thoracentesis. No pneumothorax. Small right effusion and right base atelectasis persists. Heart is borderline in size. Left lung is clear. IMPRESSION: No pneumothorax following right thoracentesis. Small residual right effusion with right base atelectasis. Electronically Signed   By: Rolm Baptise M.D.   On: 10/01/2016 14:33   Dg Chest 1 View  Result Date: 09/25/2016 CLINICAL DATA:  Status post right thoracentesis. EXAM: CHEST 1 VIEW COMPARISON:  Radiographs of September 24, 2016. FINDINGS: Stable cardiomegaly. Atherosclerosis of thoracic aorta is noted. No pneumothorax is noted. Right pleural effusion appears to have resolved. No acute pulmonary disease is noted. Bony thorax is unremarkable. IMPRESSION: Aortic atherosclerosis. Right pleural effusion has resolved status post thoracentesis. No pneumothorax is noted. Electronically Signed   By: Marijo Conception, M.D.   On: 09/25/2016 16:44   Dg Chest 2 View  Result Date: 10/16/2016 CLINICAL DATA:  Follow-up pleural effusion . EXAM: CHEST  2 VIEW COMPARISON:  CT 10/14/2016.  Chest x-ray 10/14/2016 09/25/2016. FINDINGS: Mediastinum and hilar structures are normal. Heart size  normal. Right lower lobe atelectasis and infiltrate. Small right pleural effusion. No acute bony abnormality identified. IMPRESSION: Right lower lobe atelectasis and infiltrate again noted. With small right pleural effusion noted. Similar findings noted on prior exam. No pneumothorax. Electronically Signed   By: Marcello Moores  Register   On: 10/16/2016 10:32   Dg Chest 2 View  Result Date: 10/14/2016 CLINICAL DATA:  Left-sided breast pain and shortness of breath. History of lung cancer. EXAM: CHEST  2 VIEW COMPARISON:  Chest radiograph 10/07/2016 and chest CT 09/24/2016 FINDINGS: Cardiomediastinal contours are unchanged. Small right pleural effusion with loculated component along the right lateral hemithorax is slightly decreased in size. Multiple nodular opacities overlying the right lung are  unchanged. There is atherosclerotic calcification within the aortic arch. No pneumothorax, focal airspace consolidation or pulmonary edema. IMPRESSION: 1. No acute cardiopulmonary disease. 2. Decreased size of partially loculated right pleural effusion and unchanged multifocal nodular opacities of the right hemithorax. 3. Aortic atherosclerosis. Electronically Signed   By: Ulyses Jarred M.D.   On: 10/14/2016 14:12   Dg Chest 2 View  Result Date: 10/07/2016 CLINICAL DATA:  Recurrent right pleural and fusion associated with shortness of breath and chest pain. History of COPD, former smoker, diabetes. Patient also has a history of right-sided breast malignancy. EXAM: CHEST  2 VIEW COMPARISON:  Chest x-rays of October 04, 2016, September 24, 2016, and CT scan of the chest of September 24, 2016. FINDINGS: The left lung is mildly hyperinflated. There is no left-sided pleural effusion. On the right there is a small pleural effusion which has increased slightly in size over the past 3 days. There is no pneumothorax. There is stable ovoid density projecting over the posterior aspect of the right seventh rib and patchy interstitial density more inferiorly. Is calcification in the wall of the aortic arch. The heart and pulmonary vascularity are normal. The bony thorax exhibits no acute abnormality. The heart and pulmonary vascularity are normal. The mediastinum is normal in width. IMPRESSION: Slight interval increase in the volume of the small right pleural effusion. Stable underlying chronic bronchitic changes. Patchy density in the right mid lung is stable. Thoracic aortic atherosclerosis. Electronically Signed   By: David  Martinique M.D.   On: 10/07/2016 09:41   Dg Chest 2 View  Result Date: 10/04/2016 CLINICAL DATA:  Shortness of breath.  Ex-smoker. EXAM: CHEST  2 VIEW COMPARISON:  10/01/2016. FINDINGS: Increasing RIGHT pleural effusion. Compressive atelectasis at the RIGHT base but no definite consolidation. Trace LEFT  pleural effusion. No pneumothorax. Normal heart size. IMPRESSION: Beginning re-accumulation of RIGHT pleural effusion post thoracentesis. Electronically Signed   By: Staci Righter M.D.   On: 10/04/2016 10:28   Dg Chest 2 View  Result Date: 09/24/2016 CLINICAL DATA:  Shortness of breath and chest pain EXAM: CHEST  2 VIEW COMPARISON:  December 05, 2013 FINDINGS: There is a right pleural effusion with right base atelectasis. Lungs elsewhere are clear. Heart size and pulmonary vascularity are normal. No adenopathy. There is atherosclerotic calcification in the aorta. No bone lesions. IMPRESSION: Right pleural effusion with right base atelectasis. Lungs elsewhere clear. There is aortic atherosclerosis. Electronically Signed   By: Lowella Grip III M.D.   On: 09/24/2016 13:20   Ct Angio Chest Pe W Or Wo Contrast  Result Date: 10/14/2016 CLINICAL DATA:  Short of breath. Pleural drainage catheter removed 1 week prior. EXAM: CT ANGIOGRAPHY CHEST WITH CONTRAST TECHNIQUE: Multidetector CT imaging of the chest was performed using the standard protocol during bolus administration  of intravenous contrast. Multiplanar CT image reconstructions and MIPs were obtained to evaluate the vascular anatomy. CONTRAST:  70 mL Isovue COMPARISON:  CT 09/24/2016 FINDINGS: Cardiovascular: Coronary artery calcification and aortic atherosclerotic calcification. No pericardial fluid. Mediastinum/Nodes: No axillary or supraclavicular adenopathy. No mediastinal adenopathy. Lungs/Pleura: There are multiple small pockets of loculated pleural fluid along the posterior aspect of the RIGHT hemithorax. Interval reduction of moderate dependent effusion effusion seen on comparison exam. Some fluid extends along the fissure. No pneumothorax. Rounded nodule in the anterior RIGHT middle lobe measuring 9 mm (image 63, series 11. Nodule in the anterior RIGHT upper lobe additionally measuring 16 mm on image 44, series. These 2 pulmonary nodules are  not changed comparison exam. LEFT lung clear Upper Abdomen: Limited view of the liver, kidneys, pancreas are unremarkable. Normal adrenal glands. Musculoskeletal: No aggressive osseous lesion. Review of the MIP images confirms the above findings. IMPRESSION: 1. Multiple small pockets of loculated pleural fluid within the RIGHT hemithorax replaces the previous seen moderate pleural effusion. 2. Stable pulmonary nodules the RIGHT upper lobe and RIGHT middle lobe of undetermined etiology. 3. No pneumothorax Electronically Signed   By: Suzy Bouchard M.D.   On: 10/14/2016 17:00   Ct Angio Chest Pe W Or Wo Contrast  Result Date: 09/25/2016 CLINICAL DATA:  Subacute onset of right-sided chest pain. Increasing respiratory distress. Leukocytosis and elevated D-dimer. Initial encounter. EXAM: CT ANGIOGRAPHY CHEST WITH CONTRAST TECHNIQUE: Multidetector CT imaging of the chest was performed using the standard protocol during bolus administration of intravenous contrast. Multiplanar CT image reconstructions and MIPs were obtained to evaluate the vascular anatomy. CONTRAST:  100 mL of Isovue 370 IV contrast COMPARISON:  None. FINDINGS: Cardiovascular: There is no evidence of significant pulmonary embolus. Evaluation for pulmonary embolus is suboptimal in areas of airspace consolidation and due to motion artifact. Scattered calcification is noted along the aortic arch and descending thoracic aorta. The heart remains borderline normal in size. Scattered coronary artery calcifications are seen. The great vessels are unremarkable in appearance. Mediastinum/Nodes: Visualized mediastinal nodes remain normal in size. No pericardial effusion is identified. Bilateral hypoattenuating nodules are noted within the thyroid gland, measuring up to 1.7 cm on the left. No axillary lymphadenopathy is seen. The patient is status post right-sided mastectomy. Lungs/Pleura: A moderate right-sided pleural effusion is noted, with partial  consolidation at the right lung base. There is a 1.7 x 1.0 cm pleural-based nodule anteriorly at the right middle lobe (image 49 of 95). Malignancy cannot be excluded. No pneumothorax is seen. Upper Abdomen: The visualized portions of the liver and spleen are grossly unremarkable. The visualized portions of the adrenal glands are within normal limits. Musculoskeletal: No acute osseous abnormalities are identified. An hemangioma is noted at vertebral body T9. The visualized musculature is unremarkable in appearance. Review of the MIP images confirms the above findings. IMPRESSION: 1. No evidence of significant pulmonary embolus. 2. Moderate right-sided pleural effusion, with partial consolidation at the right lung base. This could reflect pneumonia. 3. Apparent 1.7 x 1.0 cm pleural-based nodule anteriorly at the right middle lobe. Malignancy cannot be excluded. PET/CT would be helpful for further evaluation. Alternatively, diagnostic thoracentesis might be considered. 4. **An incidental finding of potential clinical significance has been found. 1.7 cm hypoattenuating nodule at the left thyroid lobe. Consider further evaluation with thyroid ultrasound. If patient is clinically hyperthyroid, consider nuclear medicine thyroid uptake and scan.** 5. Scattered coronary artery calcifications seen. 6. 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 Electronically Signed   By: Jacqulynn Cadet  Chang M.D.   On: 09/25/2016 00:58   Mr Jeri Cos MO Contrast  Result Date: 10/04/2016 CLINICAL DATA:  New diagnosis of lung cancer. History of breast cancer, hypertension rheumatoid arthritis. Assess for intracranial metastasis. EXAM: MRI HEAD WITHOUT AND WITH CONTRAST TECHNIQUE: Multiplanar, multiecho pulse sequences of the brain and surrounding structures were obtained without and with intravenous contrast. CONTRAST:  70m MULTIHANCE GADOBENATE DIMEGLUMINE 529 MG/ML IV SOLN COMPARISON:  None. FINDINGS: Mild motion degraded examination. INTRACRANIAL  CONTENTS: No reduced diffusion to suggest acute ischemia or hypercellular tumor. No susceptibility artifact to suggest hemorrhage. The ventricles and sulci are normal for patient's age. Patchy supratentorial white matter FLAIR T2 hyperintensities are less than expected for age, compatible with chronic small vessel ischemic disease. No suspicious parenchymal signal, masses, mass effect. No abnormal intraparenchymal or extra-axial enhancement. No abnormal extra-axial fluid collections. No extra-axial masses. VASCULAR: Normal major intracranial vascular flow voids present at skull base. SKULL AND UPPER CERVICAL SPINE: No abnormal sellar expansion. No suspicious calvarial bone marrow signal. Craniocervical junction maintained. Small RIGHT exostosis. SINUSES/ORBITS: RIGHT maxillary mucosal retention cyst. Small RIGHT mastoid effusion.The included ocular globes and orbital contents are non-suspicious. OTHER: None. IMPRESSION: No intracranial metastasis. Negative motion degraded MRI head with and without contrast for age. Electronically Signed   By: CElon AlasM.D.   On: 10/04/2016 18:35   Ir Guided DNiel HummerW Catheter Placement  Result Date: 10/07/2016 CLINICAL DATA:  History of lung cancer, now with recurrent symptomatic right-sided malignant effusion. Please perform tunneled right-sided pleural drainage catheter for palliative purposes. EXAM: INSERTION OF TUNNELED RIGHT SIDED PLEURAL DRAINAGE CATHETER COMPARISON:  Ultrasound-guided thoracentesis - 10/01/2016; 09/25/2016; chest radiograph - 10/07/2016 MEDICATIONS: Ancef 2 gm IV; Antibiotic was administered in an appropriate time interval for the procedure. ANESTHESIA/SEDATION: Moderate (conscious) sedation was employed during this procedure. A total of Versed 2 mg and Fentanyl 50 mcg was administered intravenously. Moderate Sedation Time: 14 minutes. The patient's level of consciousness and vital signs were monitored continuously by radiology nursing throughout  the procedure under my direct supervision. FLUOROSCOPY TIME:  FLUOROSCOPY TIME 36 seconds (229.4mGy) COMPLICATIONS: None immediate. PROCEDURE: The procedure, risks, benefits, and alternatives were explained to the patient, who wish to proceed with the placement of this permanent pleural catheter as the patient is seeking palliative care. The patient understand and consent to the procedure. The right inferior lateral chest and upper abdomen were prepped with Chlorhexidine in a sterile fashion, and a sterile drape was applied covering the operative field. A sterile gown and sterile gloves were used for the procedure. Initial ultrasound scanning and fluoroscopic imaging demonstrates a recurrent moderate to large pleural effusion. Under direct ultrasound guidance, the right inferior lateral pleural space was accessed with a Yueh sheath needle after the overlying soft tissues were anesthetized with 1% lidocaine with epinephrine. An Amplatz super stiff wire was then advanced under fluoroscopy into the pleural space. A 15.5 French tunneled Pleur-X catheter was tunneled from an incision within the right upper abdominal quadrant to the access site. The pleural access site was serially dilated under fluoroscopy, ultimately allowing placement of a peel-away sheath. The catheter was advanced through the peel-away sheath. The sheath was then removed. Final catheter positioning was confirmed with a fluoroscopic radiographic image. The access incision was closed with subcutaneous subcuticular 4-0 Vicryl, Dermabond and Steri-Strips. A Prolene retention suture was applied at the catheter exit site. Large volume thoracentesis was performed through the new catheter utilizing provided bulb vacuum assisted drainage bag. The patient tolerated the  above procedure well without immediate postprocedural complication. FINDINGS: Preprocedural ultrasound scanning demonstrates a recurrent moderate sized right sided pleural effusion. After  ultrasound and fluoroscopic guided placement, the catheter is coiled within the caudal aspect of the right hemi thorax at the location of the patient's recurrent malignant effusion. Following catheter placement, approximately 1.3 L of serous though slightly blood tinged pleural fluid was removed. IMPRESSION: Successful placement of permanent, tunneled right pleural drainage catheter via lateral approach. Approximately 1.3 L of serous though slightly blood tinged pleural fluid was removed after catheter placement. Electronically Signed   By: Sandi Mariscal M.D.   On: 10/07/2016 16:42   Dg Chest Port 1 View  Result Date: 09/28/2016 CLINICAL DATA:  Productive cough today EXAM: PORTABLE CHEST 1 VIEW COMPARISON:  09/25/2016 FINDINGS: There is new airspace opacity obscuring the right hemidiaphragm. Atherosclerotic calcification of the aortic arch. The left lung appears clear.  Upper normal heart size. IMPRESSION: 1. Obscuration of the right hemidiaphragm with some blunting of the right lateral costophrenic angle. This could reflect recurrent right pleural effusion with passive atelectasis although right basilar pneumonia is not readily excluded. 2. Atherosclerotic aortic arch. Electronically Signed   By: Van Clines M.D.   On: 09/28/2016 10:26   US Thyroid  Result Date: 09/27/2016 CLINICAL DATA:  Incidental on CT.  Thyroid nodule seen on CT. EXAM: THYROID ULTRASOUND TECHNIQUE: Ultrasound examination of the thyroid gland and adjacent soft tissues was performed. COMPARISON:  Chest CT 09/24/2016 FINDINGS: Parenchymal Echotexture: Mildly heterogenous Estimated total number of nodules >/= 1 cm: 3 Number of spongiform nodules >/=  2 cm not described below (TR1): 0 Number of mixed cystic and solid nodules >/= 1.5 cm not described below (TR2): 0 _________________________________________________________ Isthmus: Measures 0.4 cm in thickness. No discrete nodules are identified within the thyroid isthmus.  _________________________________________________________ Right lobe: Measures 4.2 x 1.6 x 2.0 cm. Nodule # 1: Location: Isthmus; Superior Size: 1.6 x 1.3 x 1.4 cm. Composition: solid/almost completely solid (2), there is an eccentric cystic component. Echogenicity: isoechoic (1) Shape: not taller-than-wide (0) Margins: ill-defined (0) Echogenic foci: none (0) ACR TI-RADS total points: 3. ACR TI-RADS risk category: TR3 (3 points). ACR TI-RADS recommendations: *Given size (>/= 1.5 - 2.4 cm) and appearance, a follow-up ultrasound in 1 year should be considered based on TI-RADS criteria. Nodule # 2: Location: Right; Inferior Size: 0.6 x 0.4 x 0.5 cm Composition: solid/almost completely solid (2) Echogenicity: hypoechoic (2) Shape: not taller-than-wide (0) Margins: ill-defined (0) Echogenic foci: none (0) ACR TI-RADS total points: 4. ACR TI-RADS risk category: TR4 (4-6 points). ACR TI-RADS recommendations: Given size (<0.9 cm) and appearance, this nodule does NOT meet TI-RADS criteria for biopsy or dedicated follow-up. _________________________________________________________ Left lobe: Measures 4.1 x 1.6 x 1.9 cm. Nodule # 3: Location: Left; Superior Size: 1.0 x 1.0 x 1.0 cm. Composition: mixed cystic and solid (1) Echogenicity: hypoechoic (2) Shape: not taller-than-wide (0) Margins: smooth (0) Echogenic foci: none (0) ACR TI-RADS total points: 3. ACR TI-RADS risk category: TR3 (3 points). ACR TI-RADS recommendations: Given size (<1.4 cm) and appearance, this nodule does NOT meet TI-RADS criteria for biopsy or dedicated follow-up. There is a cystic structure in the superior left thyroid lobe measuring 1.0 x 0.7 x 0.7 cm. IMPRESSION: Solid and cystic nodules in the thyroid. Largest nodule measures 1.6 cm in the superior right lobe. This nodule meets criteria for 1 year follow-up. The above is in keeping with the ACR TI-RADS recommendations - J Am Coll Radiol 2017;14:587-595. Electronically Signed  By: Markus Daft M.D.    On: 09/27/2016 07:59   Ir Removal Of Plural Cath W/cuff  Result Date: 10/10/2016 INDICATION: 70 year old female with a history of malignant right-sided pleural effusion and PleurX catheter placed 10/07/2016. She presents today for malfunctioning catheter. EXAM: IR REMOVAL OF PLEURAL CATH WITH CUFF MEDICATIONS: The patient is currently admitted to the hospital and receiving intravenous antibiotics. The antibiotics were administered within an appropriate time frame prior to the initiation of the procedure. ANESTHESIA/SEDATION: None COMPLICATIONS: None PROCEDURE: Informed written consent was obtained from the patient after a thorough discussion of the procedural risks, benefits and alternatives. All questions were addressed. Maximal Sterile Barrier Technique was utilized including caps, mask, sterile gowns, sterile gloves, sterile drape, hand hygiene and skin antiseptic. A timeout was performed prior to the initiation of the procedure. Patient was positioned supine position on the fluoroscopy table. 1% lidocaine was used for local anesthesia. Spot images of the chest were performed. Contrast injection was performed of the catheter. Glidewire was advanced through the catheter in attempt to reposition. Retention suture was ligated in order to Dermabond the skin. Catheter was damaged during the manipulation requiring attempt at exchange. Stiff wire was advanced through the catheter which was removed from the stiff wire. Combination of the peel-away sheath, Kumpe catheter were used in order to stiff in the tract an allow passage of the new catheter over the wiring catheter combination. Exchange of the catheter failed, with inability to passed through the soft tissue tract and enter the pleura. Catheter was removed and dressing was placed. Patient tolerated the procedure well and remained hemodynamically stable throughout. No complications were encountered and no significant blood loss encountered. FINDINGS: Initial  image demonstrates catheter coiled at the base of the lung, potentially subpulmonic. Contrast injection demonstrates contrast accumulating within the pleural is a space. Wire manipulation failed to manipulate the catheter to more superior position. Images demonstrate failed catheter exchange given the damaged catheter. IMPRESSION: Status post failed attempt at exchange of a tunneled right chest PleurX catheter. Signed, Dulcy Fanny. Earleen Newport, DO [facility] Radiology PLAN: The patient will return for a scheduled visit at the [facility] with the chest x-ray to determine need for new PleurX catheter placement versus potential repeat thoracentesis preceding her oncology plan. Electronically Signed   By: Corrie Mckusick D.O.   On: 10/10/2016 16:52   US Thoracentesis Asp Pleural Space W/img Guide  Result Date: 10/01/2016 INDICATION: Recurrent right pleural effusion of unknown etiology. Request for diagnostic and therapeutic thoracentesis. EXAM: ULTRASOUND GUIDED DIAGNOSTIC AND THERAPEUTIC THORACENTESIS MEDICATIONS: 1% Lidocaine COMPLICATIONS: None immediate. PROCEDURE: An ultrasound guided thoracentesis was thoroughly discussed with the patient and questions answered. The benefits, risks, alternatives and complications were also discussed. The patient understands and wishes to proceed with the procedure. Written consent was obtained. Ultrasound was performed to localize and mark an adequate pocket of fluid in the right chest. The area was then prepped and draped in the normal sterile fashion. 1% Lidocaine was used for local anesthesia. Under ultrasound guidance a Safe-T-Centesis catheter was introduced. Thoracentesis was performed. The catheter was removed and a dressing applied. FINDINGS: A total of approximately 1.2L of serosanguineous fluid was removed. Samples were sent to the laboratory as requested by the clinical team. IMPRESSION: Successful ultrasound guided right  thoracentesis yielding 1.2L of pleural fluid. Read by: Saverio Danker, PA-C Electronically Signed   By: Lucrezia Europe M.D.   On: 10/01/2016 14:31   US Thoracentesis Asp Pleural Space W/img  Guide  Result Date: 09/25/2016 INDICATION: Ex-smoker with remote history of breast cancer, COPD, rheumatoid arthritis, dyspnea, pneumonia, right pleural effusion. Request made for diagnostic and therapeutic right thoracentesis. EXAM: ULTRASOUND GUIDED DIAGNOSTIC AND THERAPEUTIC RIGHT THORACENTESIS MEDICATIONS: None. COMPLICATIONS: None immediate. PROCEDURE: An ultrasound guided thoracentesis was thoroughly discussed with the patient and questions answered. The benefits, risks, alternatives and complications were also discussed. The patient understands and wishes to proceed with the procedure. Written consent was obtained. Ultrasound was performed to localize and mark an adequate pocket of fluid in the right chest. The area was then prepped and draped in the normal sterile fashion. 1% Lidocaine was used for local anesthesia. Under ultrasound guidance a Safe-T-Centesis catheter was introduced. Thoracentesis was performed. The catheter was removed and a dressing applied. FINDINGS: A total of approximately 1.5 liters of hazy, amber fluid was removed. Samples were sent to the laboratory as requested by the clinical team. IMPRESSION: Successful ultrasound guided diagnostic and therapeutic right thoracentesis yielding 1.5 liters of pleural fluid. Read by: Rowe Robert, PA-C Electronically Signed   By: Marybelle Killings M.D.   On: 09/25/2016 16:15    2-D echo Study Conclusions  - Left ventricle: The cavity size was normal. Wall thickness was   increased in a pattern of mild LVH. Systolic function was   vigorous. The estimated ejection fraction was in the range of 65%   to 70%. Wall motion was normal; there were no regional wall   motion abnormalities. Doppler parameters are consistent with   abnormal left ventricular relaxation  (grade 1 diastolic   dysfunction). - Aortic valve: There is an increased gradient across the aortic   valve that may represent a gradient across the LVOT. The valve   leaflets are thin and appear to open normally. Valve area (VTI):   1.51 cm^2. Valve area (Vmax): 1.42 cm^2. Valve area (Vmean): 1.53   cm^2. - Mitral valve: There was mild regurgitation.   Subjective: Breathing better today. Episodes of PVCs and an SVT on telemetry.  Discharge Exam: Vitals:   10/17/16 0836 10/17/16 1323  BP:  (!) 115/51  Pulse: (!) 101 88  Resp: 18 20  Temp:  98 F (36.7 C)   Vitals:   10/16/16 2057 10/17/16 0521 10/17/16 0836 10/17/16 1323  BP: 123/61 (!) 143/69  (!) 115/51  Pulse: 100 (!) 117 (!) 101 88  Resp: '18 18 18 20  '$ Temp: 98.7 F (37.1 C) 99 F (37.2 C)  98 F (36.7 C)  TempSrc: Oral Oral  Oral  SpO2: 96% 90% 92% 92%  Weight:      Height:        Gen: not in distress HEENT:  moist mucosa, supple neck Chest: Diminished breath sounds on the right lung, no added sounds CVS: N S1&S2, no murmurs, rubs or gallop GI: soft, NT, ND, Musculoskeletal: warm, no edema   The results of significant diagnostics from this hospitalization (including imaging, microbiology, ancillary and laboratory) are listed below for reference.     Microbiology: Recent Results (from the past 240 hour(s))  MRSA PCR Screening     Status: None   Collection Time: 10/15/16  1:20 PM  Result Value Ref Range Status   MRSA by PCR NEGATIVE NEGATIVE Final    Comment:        The GeneXpert MRSA Assay (FDA approved for NASAL specimens only), is one component of a comprehensive MRSA colonization surveillance program. It is not intended to diagnose MRSA infection nor to guide or monitor treatment  for MRSA infections.      Labs: BNP (last 3 results) No results for input(s): BNP in the last 8760 hours. Basic Metabolic Panel:  Recent Labs Lab 10/14/16 1341 10/15/16 0427 10/16/16 0306 10/17/16 0458  NA  139 138 140 139  K 3.4* 3.9 2.9* 3.8  CL 97* 97* 99* 101  CO2 32 33* 33* 28  GLUCOSE 148* 172* 114* 114*  BUN '6 13 19 14  '$ CREATININE 0.56 0.62 0.66 0.63  CALCIUM 9.7 9.1 8.8* 9.0  MG  --  1.6* 2.1  --   PHOS  --  3.7 3.4  --    Liver Function Tests:  Recent Labs Lab 10/15/16 0427 10/16/16 0306  AST 19 14*  ALT 21 18  ALKPHOS 72 73  BILITOT 0.6 0.6  PROT 6.4* 6.3*  ALBUMIN 2.6* 2.7*   No results for input(s): LIPASE, AMYLASE in the last 168 hours. No results for input(s): AMMONIA in the last 168 hours. CBC:  Recent Labs Lab 10/14/16 1341 10/15/16 0427 10/16/16 0306  WBC 10.9* 8.8 13.8*  NEUTROABS 9.2*  --  10.5*  HGB 10.6* 10.4* 10.0*  HCT 35.5* 34.4* 33.2*  MCV 89.2 89.1 90.2  PLT 374 366 412*   Cardiac Enzymes:  Recent Labs Lab 10/15/16 0021 10/15/16 0427 10/15/16 1232  TROPONINI <0.03 <0.03 <0.03   BNP: Invalid input(s): POCBNP CBG: No results for input(s): GLUCAP in the last 168 hours. D-Dimer No results for input(s): DDIMER in the last 72 hours. Hgb A1c No results for input(s): HGBA1C in the last 72 hours. Lipid Profile No results for input(s): CHOL, HDL, LDLCALC, TRIG, CHOLHDL, LDLDIRECT in the last 72 hours. Thyroid function studies  Recent Labs  10/15/16 0428  TSH 0.083*   Anemia work up No results for input(s): VITAMINB12, FOLATE, FERRITIN, TIBC, IRON, RETICCTPCT in the last 72 hours. Urinalysis    Component Value Date/Time   COLORURINE YELLOW 04/30/2011 1340   APPEARANCEUR CLEAR 04/30/2011 1340   LABSPEC 1.014 04/30/2011 1340   PHURINE 6.5 04/30/2011 1340   GLUCOSEU NEGATIVE 04/30/2011 1340   HGBUR NEGATIVE 04/30/2011 1340   BILIRUBINUR NEGATIVE 04/30/2011 1340   KETONESUR NEGATIVE 04/30/2011 1340   PROTEINUR NEGATIVE 04/30/2011 1340   UROBILINOGEN 0.2 04/30/2011 1340   NITRITE NEGATIVE 04/30/2011 1340   LEUKOCYTESUR NEGATIVE 04/30/2011 1340   Sepsis Labs Invalid input(s): PROCALCITONIN,  WBC,   LACTICIDVEN Microbiology Recent Results (from the past 240 hour(s))  MRSA PCR Screening     Status: None   Collection Time: 10/15/16  1:20 PM  Result Value Ref Range Status   MRSA by PCR NEGATIVE NEGATIVE Final    Comment:        The GeneXpert MRSA Assay (FDA approved for NASAL specimens only), is one component of a comprehensive MRSA colonization surveillance program. It is not intended to diagnose MRSA infection nor to guide or monitor treatment for MRSA infections.      Time coordinating discharge: Over 30 minutes  SIGNED:   Louellen Molder, MD  [facility] 10/17/2016, 3:55 PM Pager   If 7PM-7AM, please contact night-coverage [email or web address] Password [email or web address]

## 2016-10-17 NOTE — Progress Notes (Signed)
Explained to pt that she was discharged. Pt did not have O2 with her to travel on. A call was made to [facility] rep. O2 tank from [place] given to pt.

## 2016-10-17 NOTE — Progress Notes (Signed)
Pt is active with [facility] and have a RW at home. Pt states that she has not been out of bed with PT while on the floor, just once in ICU. Pt complain of being weak. Pt continue to say that she had to go up 2 flights of stairs at home.

## 2016-10-17 NOTE — Progress Notes (Signed)
Spoke with pt's sister at length after session about pt's home set up.  Pt lives in a second story apartment and has to manage 2 flights of steps before getting to apartment. Pt's O2 sats dropping to 88% on 3L after walking 10 feet this morning.  I am not confident pt will be able to manage multiple flights of steps to get into her home several times a day. (Pt has several upcoming MD appts).  Sister is concerned about steps. Jinger [place], [place] [phone]

## 2016-10-18 ENCOUNTER — Other Ambulatory Visit: Payer: Self-pay | Admitting: Internal Medicine

## 2016-10-18 ENCOUNTER — Inpatient Hospital Stay: Payer: Medicare Other | Admitting: Internal Medicine

## 2016-10-18 DIAGNOSIS — J90 Pleural effusion, not elsewhere classified: Secondary | ICD-10-CM

## 2016-10-18 DIAGNOSIS — C3490 Malignant neoplasm of unspecified part of unspecified bronchus or lung: Secondary | ICD-10-CM

## 2016-10-21 ENCOUNTER — Telehealth: Payer: Self-pay | Admitting: *Deleted

## 2016-10-21 ENCOUNTER — Telehealth: Payer: Self-pay | Admitting: Medical Oncology

## 2016-10-21 NOTE — Telephone Encounter (Signed)
D/c from hospital last week . Can she  resume home care for nursing to monitor respiratory status?

## 2016-10-21 NOTE — Telephone Encounter (Signed)
Message from Operator to call [facility] Physical Therapist Mariella Saa.  Called providing verbal order for patient to continue Home Health services.

## 2016-10-21 NOTE — Telephone Encounter (Signed)
Mailed [facility] letter to pt.  

## 2016-10-22 ENCOUNTER — Ambulatory Visit (HOSPITAL_COMMUNITY)
Admission: RE | Admit: 2016-10-22 | Discharge: 2016-10-22 | Disposition: A | Discharge status: Home / Self Care | Payer: Medicare Other | Source: Ambulatory Visit | Attending: Internal Medicine | Admitting: Internal Medicine

## 2016-10-22 ENCOUNTER — Other Ambulatory Visit: Payer: Self-pay | Admitting: Radiology

## 2016-10-22 DIAGNOSIS — C7972 Secondary malignant neoplasm of left adrenal gland: Secondary | ICD-10-CM | POA: Insufficient documentation

## 2016-10-22 DIAGNOSIS — C3491 Malignant neoplasm of unspecified part of right bronchus or lung: Secondary | ICD-10-CM | POA: Insufficient documentation

## 2016-10-22 DIAGNOSIS — J910 Malignant pleural effusion: Secondary | ICD-10-CM | POA: Insufficient documentation

## 2016-10-22 DIAGNOSIS — C787 Secondary malignant neoplasm of liver and intrahepatic bile duct: Secondary | ICD-10-CM | POA: Insufficient documentation

## 2016-10-22 DIAGNOSIS — M8448XA Pathological fracture, other site, initial encounter for fracture: Secondary | ICD-10-CM | POA: Diagnosis not present

## 2016-10-22 DIAGNOSIS — R918 Other nonspecific abnormal finding of lung field: Secondary | ICD-10-CM | POA: Insufficient documentation

## 2016-10-22 DIAGNOSIS — C7951 Secondary malignant neoplasm of bone: Secondary | ICD-10-CM | POA: Diagnosis not present

## 2016-10-22 LAB — GLUCOSE, CAPILLARY: Glucose-Capillary: 160 mg/dL — ABNORMAL HIGH (ref 65–99)

## 2016-10-22 MED ORDER — FLUDEOXYGLUCOSE F - 18 (FDG) INJECTION: 11.4000 | Freq: Once | INTRAVENOUS | Status: DC | PRN

## 2016-10-24 ENCOUNTER — Ambulatory Visit (HOSPITAL_BASED_OUTPATIENT_CLINIC_OR_DEPARTMENT_OTHER): Payer: Medicare Other | Admitting: Internal Medicine

## 2016-10-24 ENCOUNTER — Encounter: Payer: Self-pay | Admitting: *Deleted

## 2016-10-24 ENCOUNTER — Ambulatory Visit (HOSPITAL_COMMUNITY): Payer: Medicare Other

## 2016-10-24 ENCOUNTER — Other Ambulatory Visit (HOSPITAL_BASED_OUTPATIENT_CLINIC_OR_DEPARTMENT_OTHER): Payer: Medicare Other

## 2016-10-24 ENCOUNTER — Encounter: Payer: Self-pay | Admitting: Internal Medicine

## 2016-10-24 ENCOUNTER — Inpatient Hospital Stay (HOSPITAL_COMMUNITY): Admission: RE | Admit: 2016-10-24 | Payer: Medicare Other | Source: Ambulatory Visit

## 2016-10-24 ENCOUNTER — Other Ambulatory Visit (HOSPITAL_COMMUNITY): Payer: Medicare Other

## 2016-10-24 ENCOUNTER — Ambulatory Visit: Payer: Medicare Other | Attending: Internal Medicine | Admitting: Physical Therapy

## 2016-10-24 ENCOUNTER — Institutional Professional Consult (permissible substitution) (INDEPENDENT_AMBULATORY_CARE_PROVIDER_SITE_OTHER): Payer: Medicare Other | Admitting: Cardiothoracic Surgery

## 2016-10-24 VITALS — BP 120/57 | HR 91 | Temp 98.3°F | Resp 18 | Ht 66.0 in

## 2016-10-24 DIAGNOSIS — C3491 Malignant neoplasm of unspecified part of right bronchus or lung: Secondary | ICD-10-CM

## 2016-10-24 DIAGNOSIS — C3411 Malignant neoplasm of upper lobe, right bronchus or lung: Secondary | ICD-10-CM | POA: Diagnosis not present

## 2016-10-24 DIAGNOSIS — M5442 Lumbago with sciatica, left side: Secondary | ICD-10-CM | POA: Insufficient documentation

## 2016-10-24 DIAGNOSIS — C7971 Secondary malignant neoplasm of right adrenal gland: Secondary | ICD-10-CM

## 2016-10-24 DIAGNOSIS — D630 Anemia in neoplastic disease: Secondary | ICD-10-CM

## 2016-10-24 DIAGNOSIS — J910 Malignant pleural effusion: Secondary | ICD-10-CM

## 2016-10-24 DIAGNOSIS — M25552 Pain in left hip: Secondary | ICD-10-CM

## 2016-10-24 DIAGNOSIS — Z5111 Encounter for antineoplastic chemotherapy: Secondary | ICD-10-CM | POA: Insufficient documentation

## 2016-10-24 DIAGNOSIS — M545 Low back pain: Secondary | ICD-10-CM

## 2016-10-24 DIAGNOSIS — C7951 Secondary malignant neoplasm of bone: Secondary | ICD-10-CM

## 2016-10-24 DIAGNOSIS — R2689 Other abnormalities of gait and mobility: Secondary | ICD-10-CM

## 2016-10-24 DIAGNOSIS — M549 Dorsalgia, unspecified: Secondary | ICD-10-CM

## 2016-10-24 DIAGNOSIS — J449 Chronic obstructive pulmonary disease, unspecified: Secondary | ICD-10-CM

## 2016-10-24 DIAGNOSIS — C787 Secondary malignant neoplasm of liver and intrahepatic bile duct: Secondary | ICD-10-CM

## 2016-10-24 DIAGNOSIS — Z7189 Other specified counseling: Secondary | ICD-10-CM | POA: Insufficient documentation

## 2016-10-24 HISTORY — DX: Other specified counseling: Z71.89

## 2016-10-24 HISTORY — DX: Dorsalgia, unspecified: M54.9

## 2016-10-24 HISTORY — DX: Encounter for antineoplastic chemotherapy: Z51.11

## 2016-10-24 LAB — COMPREHENSIVE METABOLIC PANEL
ALBUMIN: 2.4 g/dL — AB (ref 3.5–5.0)
ALK PHOS: 120 U/L (ref 40–150)
ALT: 25 U/L (ref 0–55)
AST: 33 U/L (ref 5–34)
Anion Gap: 15 mEq/L — ABNORMAL HIGH (ref 3–11)
BILIRUBIN TOTAL: 0.73 mg/dL (ref 0.20–1.20)
BUN: 19.3 mg/dL (ref 7.0–26.0)
CO2: 31 mEq/L — ABNORMAL HIGH (ref 22–29)
CREATININE: 0.7 mg/dL (ref 0.6–1.1)
Calcium: 10.1 mg/dL (ref 8.4–10.4)
Chloride: 96 mEq/L — ABNORMAL LOW (ref 98–109)
EGFR: 86 mL/min/{1.73_m2} — ABNORMAL LOW (ref >90)
GLUCOSE: 137 mg/dL (ref 70–140)
Potassium: 3.3 mEq/L — ABNORMAL LOW (ref 3.5–5.1)
SODIUM: 142 meq/L (ref 136–145)
TOTAL PROTEIN: 7.2 g/dL (ref 6.4–8.3)

## 2016-10-24 LAB — CBC WITH DIFFERENTIAL/PLATELET
BASO%: 0.1 % (ref 0.0–2.0)
Basophils Absolute: 0 10*3/uL (ref 0.0–0.1)
EOS ABS: 0 10*3/uL (ref 0.0–0.5)
EOS%: 0.2 % (ref 0.0–7.0)
HCT: 34.2 % — ABNORMAL LOW (ref 34.8–46.6)
HEMOGLOBIN: 10.5 g/dL — AB (ref 11.6–15.9)
LYMPH%: 10.1 % — ABNORMAL LOW (ref 14.0–49.7)
MCH: 26.5 pg (ref 25.1–34.0)
MCHC: 30.7 g/dL — ABNORMAL LOW (ref 31.5–36.0)
MCV: 86.4 fL (ref 79.5–101.0)
MONO#: 0.8 10*3/uL (ref 0.1–0.9)
MONO%: 6.6 % (ref 0.0–14.0)
NEUT%: 83 % — ABNORMAL HIGH (ref 38.4–76.8)
NEUTROS ABS: 9.6 10*3/uL — AB (ref 1.5–6.5)
Platelets: 302 10*3/uL (ref 145–400)
RBC: 3.96 10*6/uL (ref 3.70–5.45)
RDW: 16.1 % — AB (ref 11.2–14.5)
WBC: 11.6 10*3/uL — AB (ref 3.9–10.3)
lymph#: 1.2 10*3/uL (ref 0.9–3.3)

## 2016-10-24 MED ORDER — CYANOCOBALAMIN 1000 MCG/ML IJ SOLN
1000.0000 ug | Freq: Once | INTRAMUSCULAR | Status: AC
  Administered 2016-10-24: 1000 ug via INTRAMUSCULAR

## 2016-10-24 MED ORDER — MORPHINE SULFATE ER 30 MG PO TBCR: 30.0000 mg | EXTENDED_RELEASE_TABLET | Freq: Two times a day (BID) | ORAL | 0 refills | Status: AC

## 2016-10-24 MED ORDER — LIDOCAINE-PRILOCAINE 2.5-2.5 % EX CREA: 1.0000 "application " | TOPICAL_CREAM | CUTANEOUS | 0 refills | Status: AC | PRN

## 2016-10-24 MED ORDER — PROCHLORPERAZINE MALEATE 10 MG PO TABS: 10.0000 mg | ORAL_TABLET | Freq: Four times a day (QID) | ORAL | 0 refills | Status: AC | PRN

## 2016-10-24 MED ORDER — DEXAMETHASONE 4 MG PO TABS: 4.0000 mg | ORAL_TABLET | Freq: Two times a day (BID) | ORAL | 1 refills | Status: AC

## 2016-10-24 MED ORDER — FOLIC ACID 1 MG PO TABS: 1.0000 mg | ORAL_TABLET | Freq: Every day | ORAL | 4 refills | Status: AC

## 2016-10-24 NOTE — Progress Notes (Signed)
[place] Telephone:([phone]) [phone]   Fax:(336) [place], [place] [address] [place] [address]  DIAGNOSIS: Stage IV (T3, N2, M1 B) non-small cell lung cancer, adenocarcinoma presented with right upper lobe pulmonary nodule in addition to satellite nodule in the right middle lobe, mediastinal lymphadenopathy as well as malignant right pleural effusion, liver, left adrenal and bone metastasis diagnosed in November 2017  Genomic Alterations Identified? [id number] W24R TP53 E298* Additional Findings? Microsatellite status Cannot Be Determined Tumor Mutation Burden Cannot Be Determined Additional Disease-relevant Genes with No Reportable Alterations Identified? EGFR KRAS ALK BRAF MET RET ERBB2 ROS1   PRIOR THERAPY: None  CURRENT THERAPY: Systemic chemotherapy with carboplatin for AUC of 5, Alimta 500 MG/M2 and Avastin 15 MG/KG every 3 weeks. First dose expected 10/31/2016.  INTERVAL HISTORY: [name] 70 y.o. female returns to the clinic today for hospital follow-up visit accompanied by her sister Baker Janus. The patient was seen during her hospitalization at [facility] when she presented with significant shortness breath and CT of the chest at that time showed large right pleural effusion. She underwent ultrasound-guided right thoracentesis and the final pathology was consistent with adenocarcinoma of lung primary. She also had a right Pleurx catheter placed by interventional radiology but continues to have leak around the catheter and this was removed. During her hospitalization she had MRI of the brain that showed no evidence for metastatic disease to the brain. The patient had a PET scan performed recently and she is here for evaluation and discussion of her PET scan results and treatment options. She also had molecular studies performed by [facility] one recently. She continues to have increasing  fatigue and weakness she is very anxious. She has lack of appetite and lost a lot of weight recently. She also has low back pain more to the left and close to the left hip. She has no chest pain but continues to have shortness breath at baseline and increased with exertion and she is currently on home oxygen. She has mild cough with no hemoptysis. She also complains of nausea and diarrhea but no vomiting or constipation.   MEDICAL HISTORY: Past Medical History:  Diagnosis Date  . Cancer [facility])    right breast cancer  . COPD (chronic obstructive pulmonary disease) ([place])   . Oxygen dependent     ALLERGIES:  has No Known Allergies.  MEDICATIONS:  Current Outpatient Prescriptions  Medication Sig Dispense Refill  . albuterol (PROVENTIL HFA;VENTOLIN HFA) 108 (90 BASE) MCG/ACT inhaler Inhale 2 puffs into the lungs every 6 (six) hours as needed for wheezing or shortness of breath.     Marland Kitchen atorvastatin (LIPITOR) 40 MG tablet Take 40 mg by mouth daily.    . bisoprolol (ZEBETA) 10 MG tablet Take 1 tablet (10 mg total) by mouth daily. 30 tablet 0  . feeding supplement, ENSURE ENLIVE, (ENSURE ENLIVE) LIQD Take 237 mLs by mouth 2 (two) times daily between meals. 237 mL 12  . FOLIC ACID PO Take 1 mg by mouth daily.     . furosemide (LASIX) 40 MG tablet Take 1 tablet (40 mg total) by mouth daily. 30 tablet 0  . guaiFENesin (MUCINEX) 600 MG 12 hr tablet Take 1 tablet (600 mg total) by mouth 2 (two) times daily. 10 tablet 0  . methotrexate (RHEUMATREX) 2.5 MG tablet Take 15 mg by mouth once a week. Every Tuesday.  0  . oxyCODONE-acetaminophen (PERCOCET) 10-325 MG per  tablet Take 1 tablet by mouth every 6 (six) hours as needed for pain. (Patient taking differently: Take 1 tablet by mouth 5 (five) times daily as needed for pain. ) 30 tablet 0  . predniSONE (DELTASONE) 5 MG tablet Take 10 mg by mouth every morning.  0  . pregabalin (LYRICA) 200 MG capsule Take 200 mg by mouth daily.     . SYMBICORT 160-4.5  MCG/ACT inhaler USE 2 PUFFS FIRST THING IN THE MORNING AND 2 PUFFS ABOUT 12 HOURS LATER 10.2 Inhaler 5   No current facility-administered medications for this visit.    Facility-Administered Medications Ordered in Other Visits  Medication Dose Route Frequency Provider Last Rate Last Dose  . fludeoxyglucose F - 18 (FDG) injection 50.0 millicurie  93.8 millicurie Intravenous Once PRN Genia Del, MD        SURGICAL HISTORY:  Past Surgical History:  Procedure Laterality Date  . ABDOMINAL HYSTERECTOMY  1990  . BACK SURGERY    . BREAST SURGERY Right    lumpectomy w/ radiation  . IR GENERIC HISTORICAL  10/07/2016   IR GUIDED DRAIN W CATHETER PLACEMENT 10/07/2016 Sandi Mariscal, MD [facility]-INTERV RAD  . IR GENERIC HISTORICAL  10/10/2016   IR REMOVAL OF PLURAL CATH W/CUFF 10/10/2016 Corrie Mckusick, DO [facility]-INTERV RAD    REVIEW OF SYSTEMS:  Constitutional: positive for anorexia, fatigue and weight loss Eyes: negative Ears, nose, mouth, throat, and face: negative Respiratory: positive for cough, dyspnea on exertion, sputum and wheezing Cardiovascular: negative Gastrointestinal: positive for diarrhea and nausea Genitourinary:negative Integument/breast: negative for rash Hematologic/lymphatic: negative for bleeding, easy bruising and lymphadenopathy Musculoskeletal:positive for back pain, bone pain and muscle weakness Neurological: negative Behavioral/Psych: negative Endocrine: negative Allergic/Immunologic: negative   PHYSICAL EXAMINATION: General appearance: alert, cooperative, fatigued and mild distress Head: Normocephalic, without obvious abnormality, atraumatic Neck: no adenopathy, no JVD, supple, symmetrical, trachea midline and thyroid not enlarged, symmetric, no tenderness/mass/nodules Lymph nodes: Cervical, supraclavicular, and axillary nodes normal. Resp: diminished breath sounds RLL and dullness to percussion RLL Back: negative, no kyphosis present, no scoliosis present, symmetric, no  curvature. ROM normal. No CVA tenderness. Cardio: regular rate and rhythm, S1, S2 normal, no murmur, click, rub or gallop and normal apical impulse GI: soft, non-tender; bowel sounds normal; no masses,  no organomegaly Extremities: extremities normal, atraumatic, no cyanosis or edema Neurologic: Alert and oriented X 3, normal strength and tone. Normal symmetric reflexes. Normal coordination and gait  ECOG PERFORMANCE STATUS: 1 - Symptomatic but completely ambulatory  Blood pressure (!) 120/57, pulse 91, temperature 98.3 F (36.8 C), temperature source Oral, resp. rate 18, height 5' 6"  (1.676 m), SpO2 94 %.  LABORATORY DATA: Lab Results  Component Value Date   WBC 11.6 (H) 10/24/2016   HGB 10.5 (L) 10/24/2016   HCT 34.2 (L) 10/24/2016   MCV 86.4 10/24/2016   PLT 302 10/24/2016      Chemistry      Component Value Date/Time   NA 139 10/17/2016 0458   K 3.8 10/17/2016 0458   CL 101 10/17/2016 0458   CO2 28 10/17/2016 0458   BUN 14 10/17/2016 0458   CREATININE 0.63 10/17/2016 0458      Component Value Date/Time   CALCIUM 9.0 10/17/2016 0458   ALKPHOS 73 10/16/2016 0306   AST 14 (L) 10/16/2016 0306   ALT 18 10/16/2016 0306   BILITOT 0.6 10/16/2016 0306       RADIOGRAPHIC STUDIES: Dg Chest 1 View  Result Date: 10/01/2016 CLINICAL DATA:  Post right thoracentesis EXAM:  CHEST 1 VIEW COMPARISON:  09/28/2016 FINDINGS: Decreasing right pleural effusion following thoracentesis. No pneumothorax. Small right effusion and right base atelectasis persists. Heart is borderline in size. Left lung is clear. IMPRESSION: No pneumothorax following right thoracentesis. Small residual right effusion with right base atelectasis. Electronically Signed   By: Rolm Baptise M.D.   On: 10/01/2016 14:33   Dg Chest 1 View  Result Date: 09/25/2016 CLINICAL DATA:  Status post right thoracentesis. EXAM: CHEST 1 VIEW COMPARISON:  Radiographs of September 24, 2016. FINDINGS: Stable cardiomegaly.  Atherosclerosis of thoracic aorta is noted. No pneumothorax is noted. Right pleural effusion appears to have resolved. No acute pulmonary disease is noted. Bony thorax is unremarkable. IMPRESSION: Aortic atherosclerosis. Right pleural effusion has resolved status post thoracentesis. No pneumothorax is noted. Electronically Signed   By: Marijo Conception, M.D.   On: 09/25/2016 16:44   Dg Chest 2 View  Result Date: 10/16/2016 CLINICAL DATA:  Follow-up pleural effusion . EXAM: CHEST  2 VIEW COMPARISON:  CT 10/14/2016.  Chest x-ray 10/14/2016 09/25/2016. FINDINGS: Mediastinum and hilar structures are normal. Heart size normal. Right lower lobe atelectasis and infiltrate. Small right pleural effusion. No acute bony abnormality identified. IMPRESSION: Right lower lobe atelectasis and infiltrate again noted. With small right pleural effusion noted. Similar findings noted on prior exam. No pneumothorax. Electronically Signed   By: Marcello Moores  Register   On: 10/16/2016 10:32   Dg Chest 2 View  Result Date: 10/14/2016 CLINICAL DATA:  Left-sided breast pain and shortness of breath. History of lung cancer. EXAM: CHEST  2 VIEW COMPARISON:  Chest radiograph 10/07/2016 and chest CT 09/24/2016 FINDINGS: Cardiomediastinal contours are unchanged. Small right pleural effusion with loculated component along the right lateral hemithorax is slightly decreased in size. Multiple nodular opacities overlying the right lung are unchanged. There is atherosclerotic calcification within the aortic arch. No pneumothorax, focal airspace consolidation or pulmonary edema. IMPRESSION: 1. No acute cardiopulmonary disease. 2. Decreased size of partially loculated right pleural effusion and unchanged multifocal nodular opacities of the right hemithorax. 3. Aortic atherosclerosis. Electronically Signed   By: Ulyses Jarred M.D.   On: 10/14/2016 14:12   Dg Chest 2 View  Result Date: 10/07/2016 CLINICAL DATA:  Recurrent right pleural and fusion  associated with shortness of breath and chest pain. History of COPD, former smoker, diabetes. Patient also has a history of right-sided breast malignancy. EXAM: CHEST  2 VIEW COMPARISON:  Chest x-rays of October 04, 2016, September 24, 2016, and CT scan of the chest of September 24, 2016. FINDINGS: The left lung is mildly hyperinflated. There is no left-sided pleural effusion. On the right there is a small pleural effusion which has increased slightly in size over the past 3 days. There is no pneumothorax. There is stable ovoid density projecting over the posterior aspect of the right seventh rib and patchy interstitial density more inferiorly. Is calcification in the wall of the aortic arch. The heart and pulmonary vascularity are normal. The bony thorax exhibits no acute abnormality. The heart and pulmonary vascularity are normal. The mediastinum is normal in width. IMPRESSION: Slight interval increase in the volume of the small right pleural effusion. Stable underlying chronic bronchitic changes. Patchy density in the right mid lung is stable. Thoracic aortic atherosclerosis. Electronically Signed   By: David  Martinique M.D.   On: 10/07/2016 09:41   Dg Chest 2 View  Result Date: 10/04/2016 CLINICAL DATA:  Shortness of breath.  Ex-smoker. EXAM: CHEST  2 VIEW COMPARISON:  10/01/2016. FINDINGS:  Increasing RIGHT pleural effusion. Compressive atelectasis at the RIGHT base but no definite consolidation. Trace LEFT pleural effusion. No pneumothorax. Normal heart size. IMPRESSION: Beginning re-accumulation of RIGHT pleural effusion post thoracentesis. Electronically Signed   By: Staci Righter M.D.   On: 10/04/2016 10:28   Ct Angio Chest Pe W Or Wo Contrast  Result Date: 10/14/2016 CLINICAL DATA:  Short of breath. Pleural drainage catheter removed 1 week prior. EXAM: CT ANGIOGRAPHY CHEST WITH CONTRAST TECHNIQUE: Multidetector CT imaging of the chest was performed using the standard protocol during bolus administration  of intravenous contrast. Multiplanar CT image reconstructions and MIPs were obtained to evaluate the vascular anatomy. CONTRAST:  70 mL Isovue COMPARISON:  CT 09/24/2016 FINDINGS: Cardiovascular: Coronary artery calcification and aortic atherosclerotic calcification. No pericardial fluid. Mediastinum/Nodes: No axillary or supraclavicular adenopathy. No mediastinal adenopathy. Lungs/Pleura: There are multiple small pockets of loculated pleural fluid along the posterior aspect of the RIGHT hemithorax. Interval reduction of moderate dependent effusion effusion seen on comparison exam. Some fluid extends along the fissure. No pneumothorax. Rounded nodule in the anterior RIGHT middle lobe measuring 9 mm (image 63, series 11. Nodule in the anterior RIGHT upper lobe additionally measuring 16 mm on image 44, series. These 2 pulmonary nodules are not changed comparison exam. LEFT lung clear Upper Abdomen: Limited view of the liver, kidneys, pancreas are unremarkable. Normal adrenal glands. Musculoskeletal: No aggressive osseous lesion. Review of the MIP images confirms the above findings. IMPRESSION: 1. Multiple small pockets of loculated pleural fluid within the RIGHT hemithorax replaces the previous seen moderate pleural effusion. 2. Stable pulmonary nodules the RIGHT upper lobe and RIGHT middle lobe of undetermined etiology. 3. No pneumothorax Electronically Signed   By: Suzy Bouchard M.D.   On: 10/14/2016 17:00   Ct Angio Chest Pe W Or Wo Contrast  Result Date: 09/25/2016 CLINICAL DATA:  Subacute onset of right-sided chest pain. Increasing respiratory distress. Leukocytosis and elevated D-dimer. Initial encounter. EXAM: CT ANGIOGRAPHY CHEST WITH CONTRAST TECHNIQUE: Multidetector CT imaging of the chest was performed using the standard protocol during bolus administration of intravenous contrast. Multiplanar CT image reconstructions and MIPs were obtained to evaluate the vascular anatomy. CONTRAST:  100 mL of  Isovue 370 IV contrast COMPARISON:  None. FINDINGS: Cardiovascular: There is no evidence of significant pulmonary embolus. Evaluation for pulmonary embolus is suboptimal in areas of airspace consolidation and due to motion artifact. Scattered calcification is noted along the aortic arch and descending thoracic aorta. The heart remains borderline normal in size. Scattered coronary artery calcifications are seen. The great vessels are unremarkable in appearance. Mediastinum/Nodes: Visualized mediastinal nodes remain normal in size. No pericardial effusion is identified. Bilateral hypoattenuating nodules are noted within the thyroid gland, measuring up to 1.7 cm on the left. No axillary lymphadenopathy is seen. The patient is status post right-sided mastectomy. Lungs/Pleura: A moderate right-sided pleural effusion is noted, with partial consolidation at the right lung base. There is a 1.7 x 1.0 cm pleural-based nodule anteriorly at the right middle lobe (image 49 of 95). Malignancy cannot be excluded. No pneumothorax is seen. Upper Abdomen: The visualized portions of the liver and spleen are grossly unremarkable. The visualized portions of the adrenal glands are within normal limits. Musculoskeletal: No acute osseous abnormalities are identified. An hemangioma is noted at vertebral body T9. The visualized musculature is unremarkable in appearance. Review of the MIP images confirms the above findings. IMPRESSION: 1. No evidence of significant pulmonary embolus. 2. Moderate right-sided pleural effusion, with partial consolidation at the  right lung base. This could reflect pneumonia. 3. Apparent 1.7 x 1.0 cm pleural-based nodule anteriorly at the right middle lobe. Malignancy cannot be excluded. PET/CT would be helpful for further evaluation. Alternatively, diagnostic thoracentesis might be considered. 4. **An incidental finding of potential clinical significance has been found. 1.7 cm hypoattenuating nodule at the left  thyroid lobe. Consider further evaluation with thyroid ultrasound. If patient is clinically hyperthyroid, consider nuclear medicine thyroid uptake and scan.** 5. Scattered coronary artery calcifications seen. 6. 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 Electronically Signed   By: Garald Balding M.D.   On: 09/25/2016 00:58   Mr Jeri Cos MW Contrast  Result Date: 10/04/2016 CLINICAL DATA:  New diagnosis of lung cancer. History of breast cancer, hypertension rheumatoid arthritis. Assess for intracranial metastasis. EXAM: MRI HEAD WITHOUT AND WITH CONTRAST TECHNIQUE: Multiplanar, multiecho pulse sequences of the brain and surrounding structures were obtained without and with intravenous contrast. CONTRAST:  15m MULTIHANCE GADOBENATE DIMEGLUMINE 529 MG/ML IV SOLN COMPARISON:  None. FINDINGS: Mild motion degraded examination. INTRACRANIAL CONTENTS: No reduced diffusion to suggest acute ischemia or hypercellular tumor. No susceptibility artifact to suggest hemorrhage. The ventricles and sulci are normal for patient's age. Patchy supratentorial white matter FLAIR T2 hyperintensities are less than expected for age, compatible with chronic small vessel ischemic disease. No suspicious parenchymal signal, masses, mass effect. No abnormal intraparenchymal or extra-axial enhancement. No abnormal extra-axial fluid collections. No extra-axial masses. VASCULAR: Normal major intracranial vascular flow voids present at skull base. SKULL AND UPPER CERVICAL SPINE: No abnormal sellar expansion. No suspicious calvarial bone marrow signal. Craniocervical junction maintained. Small RIGHT exostosis. SINUSES/ORBITS: RIGHT maxillary mucosal retention cyst. Small RIGHT mastoid effusion.The included ocular globes and orbital contents are non-suspicious. OTHER: None. IMPRESSION: No intracranial metastasis. Negative motion degraded MRI head with and without contrast for age. Electronically Signed   By: CElon AlasM.D.   On: 10/04/2016  18:35   Ir Guided DNiel HummerW Catheter Placement  Result Date: 10/07/2016 CLINICAL DATA:  History of lung cancer, now with recurrent symptomatic right-sided malignant effusion. Please perform tunneled right-sided pleural drainage catheter for palliative purposes. EXAM: INSERTION OF TUNNELED RIGHT SIDED PLEURAL DRAINAGE CATHETER COMPARISON:  Ultrasound-guided thoracentesis - 10/01/2016; 09/25/2016; chest radiograph - 10/07/2016 MEDICATIONS: Ancef 2 gm IV; Antibiotic was administered in an appropriate time interval for the procedure. ANESTHESIA/SEDATION: Moderate (conscious) sedation was employed during this procedure. A total of Versed 2 mg and Fentanyl 50 mcg was administered intravenously. Moderate Sedation Time: 14 minutes. The patient's level of consciousness and vital signs were monitored continuously by radiology nursing throughout the procedure under my direct supervision. FLUOROSCOPY TIME:  FLUOROSCOPY TIME 36 seconds (210.2mGy) COMPLICATIONS: None immediate. PROCEDURE: The procedure, risks, benefits, and alternatives were explained to the patient, who wish to proceed with the placement of this permanent pleural catheter as the patient is seeking palliative care. The patient understand and consent to the procedure. The right inferior lateral chest and upper abdomen were prepped with Chlorhexidine in a sterile fashion, and a sterile drape was applied covering the operative field. A sterile gown and sterile gloves were used for the procedure. Initial ultrasound scanning and fluoroscopic imaging demonstrates a recurrent moderate to large pleural effusion. Under direct ultrasound guidance, the right inferior lateral pleural space was accessed with a Yueh sheath needle after the overlying soft tissues were anesthetized with 1% lidocaine with epinephrine. An Amplatz super stiff wire was then advanced under fluoroscopy into the pleural space. A 15.5 French tunneled Pleur-X catheter was tunneled  from an incision  within the right upper abdominal quadrant to the access site. The pleural access site was serially dilated under fluoroscopy, ultimately allowing placement of a peel-away sheath. The catheter was advanced through the peel-away sheath. The sheath was then removed. Final catheter positioning was confirmed with a fluoroscopic radiographic image. The access incision was closed with subcutaneous subcuticular 4-0 Vicryl, Dermabond and Steri-Strips. A Prolene retention suture was applied at the catheter exit site. Large volume thoracentesis was performed through the new catheter utilizing provided bulb vacuum assisted drainage bag. The patient tolerated the above procedure well without immediate postprocedural complication. FINDINGS: Preprocedural ultrasound scanning demonstrates a recurrent moderate sized right sided pleural effusion. After ultrasound and fluoroscopic guided placement, the catheter is coiled within the caudal aspect of the right hemi thorax at the location of the patient's recurrent malignant effusion. Following catheter placement, approximately 1.3 L of serous though slightly blood tinged pleural fluid was removed. IMPRESSION: Successful placement of permanent, tunneled right pleural drainage catheter via lateral approach. Approximately 1.3 L of serous though slightly blood tinged pleural fluid was removed after catheter placement. Electronically Signed   By: Sandi Mariscal M.D.   On: 10/07/2016 16:42   Nm Pet Image Initial (pi) Skull Base To Thigh  Result Date: 10/22/2016 CLINICAL DATA:  Initial treatment strategy for right lung adenocarcinoma. EXAM: NUCLEAR MEDICINE PET SKULL BASE TO THIGH TECHNIQUE: 11.4 mCi F-18 FDG was injected intravenously. Full-ring PET imaging was performed from the skull base to thigh after the radiotracer. CT data was obtained and used for attenuation correction and anatomic localization. FASTING BLOOD GLUCOSE:  Value: 160 mg/dl COMPARISON:  Chest CTA on 10/14/2016 FINDINGS:  NECK No hypermetabolic lymph nodes in the neck. CHEST Small multiloculated right pleural effusion shows diffuse hypermetabolic activity, consistent with malignant pleural effusion. A spiculated nodule in the anterior right upper lobe abutting the pleural surface on image 72/6 is hypermetabolic, with SUV max of 12.2. A 10 mm smoothly marginated pulmonary nodule in the right middle lobe on image 48/7 shows mild hypermetabolism with SUV max of 4.4. No suspicious pulmonary nodule seen in the left lung. 8 mm subcarinal lymph node on image 20/3 is hypermetabolic with SUV max of 8.3. Hypermetabolic activity also seen in right hilum with SUV max of 12.2 ABDOMEN/PELVIS Multiple small hypermetabolic lesions are seen throughout the right and left hepatic lobes, consistent with diffuse liver metastases. 1.8 cm left adrenal mass is hypermetabolic with SUV max of 55.9, consistent with left adrenal metastasis. No other hypermetabolic masses or lymphadenopathy identified within the abdomen or pelvis. Colonic diverticulosis is noted, without evidence of diverticulitis. Previous hysterectomy. SKELETON Diffuse hypermetabolic lytic bone metastases are seen throughout the spine, bilateral ribs, bilateral scapulae and right humeral head, pelvis, and bilateral hips, consistent with diffuse bone metastases. Several pathologic right rib fractures are noted. IMPRESSION: 16 mm hypermetabolic spiculated nodule in the anterior right upper lobe, and 10 mm hypermetabolic smoothly marginated nodule in right middle lobe, which could represent primary bronchogenic carcinoma or pulmonary metastases. Small malignant multiloculated right pleural effusion. Small hypermetabolic right hilar and subcarinal mediastinal lymph nodes, consistent with metastatic disease. Diffuse liver metastases. Small left adrenal metastasis. Diffuse lytic bone metastases. Several pathologic right rib fractures also noted. Electronically Signed   By: Earle Gell M.D.   On:  10/22/2016 11:53   Dg Chest Port 1 View  Result Date: 09/28/2016 CLINICAL DATA:  Productive cough today EXAM: PORTABLE CHEST 1 VIEW COMPARISON:  09/25/2016 FINDINGS: There is new airspace opacity obscuring  the right hemidiaphragm. Atherosclerotic calcification of the aortic arch. The left lung appears clear.  Upper normal heart size. IMPRESSION: 1. Obscuration of the right hemidiaphragm with some blunting of the right lateral costophrenic angle. This could reflect recurrent right pleural effusion with passive atelectasis although right basilar pneumonia is not readily excluded. 2. Atherosclerotic aortic arch. Electronically Signed   By: Van Clines M.D.   On: 09/28/2016 10:26   US Thyroid  Result Date: 09/27/2016 CLINICAL DATA:  Incidental on CT.  Thyroid nodule seen on CT. EXAM: THYROID ULTRASOUND TECHNIQUE: Ultrasound examination of the thyroid gland and adjacent soft tissues was performed. COMPARISON:  Chest CT 09/24/2016 FINDINGS: Parenchymal Echotexture: Mildly heterogenous Estimated total number of nodules >/= 1 cm: 3 Number of spongiform nodules >/=  2 cm not described below (TR1): 0 Number of mixed cystic and solid nodules >/= 1.5 cm not described below (TR2): 0 _________________________________________________________ Isthmus: Measures 0.4 cm in thickness. No discrete nodules are identified within the thyroid isthmus. _________________________________________________________ Right lobe: Measures 4.2 x 1.6 x 2.0 cm. Nodule # 1: Location: Isthmus; Superior Size: 1.6 x 1.3 x 1.4 cm. Composition: solid/almost completely solid (2), there is an eccentric cystic component. Echogenicity: isoechoic (1) Shape: not taller-than-wide (0) Margins: ill-defined (0) Echogenic foci: none (0) ACR TI-RADS total points: 3. ACR TI-RADS risk category: TR3 (3 points). ACR TI-RADS recommendations: *Given size (>/= 1.5 - 2.4 cm) and appearance, a follow-up ultrasound in 1 year should be considered based on TI-RADS  criteria. Nodule # 2: Location: Right; Inferior Size: 0.6 x 0.4 x 0.5 cm Composition: solid/almost completely solid (2) Echogenicity: hypoechoic (2) Shape: not taller-than-wide (0) Margins: ill-defined (0) Echogenic foci: none (0) ACR TI-RADS total points: 4. ACR TI-RADS risk category: TR4 (4-6 points). ACR TI-RADS recommendations: Given size (<0.9 cm) and appearance, this nodule does NOT meet TI-RADS criteria for biopsy or dedicated follow-up. _________________________________________________________ Left lobe: Measures 4.1 x 1.6 x 1.9 cm. Nodule # 3: Location: Left; Superior Size: 1.0 x 1.0 x 1.0 cm. Composition: mixed cystic and solid (1) Echogenicity: hypoechoic (2) Shape: not taller-than-wide (0) Margins: smooth (0) Echogenic foci: none (0) ACR TI-RADS total points: 3. ACR TI-RADS risk category: TR3 (3 points). ACR TI-RADS recommendations: Given size (<1.4 cm) and appearance, this nodule does NOT meet TI-RADS criteria for biopsy or dedicated follow-up. There is a cystic structure in the superior left thyroid lobe measuring 1.0 x 0.7 x 0.7 cm. IMPRESSION: Solid and cystic nodules in the thyroid. Largest nodule measures 1.6 cm in the superior right lobe. This nodule meets criteria for 1 year follow-up. The above is in keeping with the ACR TI-RADS recommendations - J Am Coll Radiol 2017;14:587-595. Electronically Signed   By: Markus Daft M.D.   On: 09/27/2016 07:59   Ir Removal Of Plural Cath W/cuff  Result Date: 10/10/2016 INDICATION: 70 year old female with a history of malignant right-sided pleural effusion and PleurX catheter placed 10/07/2016. She presents today for malfunctioning catheter. EXAM: IR REMOVAL OF PLEURAL CATH WITH CUFF MEDICATIONS: The patient is currently admitted to the hospital and receiving intravenous antibiotics. The antibiotics were administered within an appropriate time frame prior to the initiation of the procedure. ANESTHESIA/SEDATION: None COMPLICATIONS: None PROCEDURE: Informed  written consent was obtained from the patient after a thorough discussion of the procedural risks, benefits and alternatives. All questions were addressed. Maximal Sterile Barrier Technique was utilized including caps, mask, sterile gowns, sterile gloves, sterile drape, hand hygiene and skin antiseptic. A timeout was performed prior to the initiation of  the procedure. Patient was positioned supine position on the fluoroscopy table. 1% lidocaine was used for local anesthesia. Spot images of the chest were performed. Contrast injection was performed of the catheter. Glidewire was advanced through the catheter in attempt to reposition. Retention suture was ligated in order to Dermabond the skin. Catheter was damaged during the manipulation requiring attempt at exchange. Stiff wire was advanced through the catheter which was removed from the stiff wire. Combination of the peel-away sheath, Kumpe catheter were used in order to stiff in the tract an allow passage of the new catheter over the wiring catheter combination. Exchange of the catheter failed, with inability to passed through the soft tissue tract and enter the pleura. Catheter was removed and dressing was placed. Patient tolerated the procedure well and remained hemodynamically stable throughout. No complications were encountered and no significant blood loss encountered. FINDINGS: Initial image demonstrates catheter coiled at the base of the lung, potentially subpulmonic. Contrast injection demonstrates contrast accumulating within the pleural is a space. Wire manipulation failed to manipulate the catheter to more superior position. Images demonstrate failed catheter exchange given the damaged catheter. IMPRESSION: Status post failed attempt at exchange of a tunneled right chest PleurX catheter. Signed, Dulcy Fanny. Earleen Newport, DO [facility] Radiology PLAN: The patient will return for a scheduled visit at the [facility]  with the chest x-ray to determine need for new PleurX catheter placement versus potential repeat thoracentesis preceding her oncology plan. Electronically Signed   By: Corrie Mckusick D.O.   On: 10/10/2016 16:52   US Thoracentesis Asp Pleural Space W/img Guide  Result Date: 10/01/2016 INDICATION: Recurrent right pleural effusion of unknown etiology. Request for diagnostic and therapeutic thoracentesis. EXAM: ULTRASOUND GUIDED DIAGNOSTIC AND THERAPEUTIC THORACENTESIS MEDICATIONS: 1% Lidocaine COMPLICATIONS: None immediate. PROCEDURE: An ultrasound guided thoracentesis was thoroughly discussed with the patient and questions answered. The benefits, risks, alternatives and complications were also discussed. The patient understands and wishes to proceed with the procedure. Written consent was obtained. Ultrasound was performed to localize and mark an adequate pocket of fluid in the right chest. The area was then prepped and draped in the normal sterile fashion. 1% Lidocaine was used for local anesthesia. Under ultrasound guidance a Safe-T-Centesis catheter was introduced. Thoracentesis was performed. The catheter was removed and a dressing applied. FINDINGS: A total of approximately 1.2L of serosanguineous fluid was removed. Samples were sent to the laboratory as requested by the clinical team. IMPRESSION: Successful ultrasound guided right thoracentesis yielding 1.2L of pleural fluid. Read by: Saverio Danker, PA-C Electronically Signed   By: Lucrezia Europe M.D.   On: 10/01/2016 14:31   US Thoracentesis Asp Pleural Space W/img Guide  Result Date: 09/25/2016 INDICATION: Ex-smoker with remote history of breast cancer, COPD, rheumatoid arthritis, dyspnea, pneumonia, right pleural effusion. Request made for diagnostic and therapeutic right thoracentesis. EXAM: ULTRASOUND GUIDED DIAGNOSTIC AND THERAPEUTIC RIGHT THORACENTESIS MEDICATIONS: None. COMPLICATIONS: None immediate. PROCEDURE: An ultrasound guided thoracentesis was  thoroughly discussed with the patient and questions answered. The benefits, risks, alternatives and complications were also discussed. The patient understands and wishes to proceed with the procedure. Written consent was obtained. Ultrasound was performed to localize and mark an adequate pocket of fluid in the right chest. The area was then prepped and draped in the normal sterile fashion. 1% Lidocaine was used for local anesthesia. Under ultrasound guidance a Safe-T-Centesis catheter was introduced. Thoracentesis was performed. The catheter was removed and a dressing applied. FINDINGS: A total of approximately 1.5  liters of hazy, amber fluid was removed. Samples were sent to the laboratory as requested by the clinical team. IMPRESSION: Successful ultrasound guided diagnostic and therapeutic right thoracentesis yielding 1.5 liters of pleural fluid. Read by: Rowe Robert, PA-C Electronically Signed   By: Marybelle Killings M.D.   On: 09/25/2016 16:15    ASSESSMENT AND PLAN: This is a very pleasant 70 years old white female with: 1) stage IV non-small cell lung cancer, adenocarcinoma with negative EGFR, ALK, ROS 1, BRAF mutations. 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 Unfortunately the patient has no actionable mutations. I had a lengthy discussion with the patient and her daughter today about her current disease stage, prognosis and treatment options. We discussed the goals of care and the patient and her daughter understand that she has incurable condition and also treatment will be off palliative nature. I gave the patient the option of palliative care and hospice referral versus consideration of treatment with systemic chemotherapy with carboplatin for AUC of 5, Alimta 500 MG/M2 and Avastin 15  MG/KG every 3 weeks. The patient is interested in proceeding with systemic chemotherapy. I discussed with her the adverse effect of this treatment including but not limited to alopecia, myelosuppression, nausea and vomiting, peripheral neuropathy, liver or renal dysfunction. I also discussed with her the adverse effect of Avastin including pulmonary hemorrhage, GI perforation, wound healing delay as well as hypertension and proteinuria The patient is interested in proceeding with the systemic chemotherapy. She is expected to start the first cycle of this treatment on 10/31/2016. I will arrange for the patient to have a chemotherapy education class before the first dose of her treatment. I will call her pharmacy with prescription for Compazine 10 mg by mouth every 6 hours as needed for nausea, folic acid 1 mg by mouth daily and Decadron 4 mg by mouth twice a day the day before, day of and day after chemotherapy every 3 weeks. She will receive vitamin B 12 injection today. 2) worsening back and left hip pain: I referred the patient to Dr. Sondra Come for consideration of palliative radiotherapy. I also started the patient on MS Contin 30 mg by mouth every 12 hours in addition to Percocet 10/325 mg by mouth every 4 hours as needed for pain. 3) intravenous access: I referred the patient to Dr. Servando Snare for consideration of Port-A-Cath placement. I will also call her pharmacy with Emla cream to be applied to the Port-A-Cath site before treatment. We'll may skip treatment with Avastin during the first cycle because of the Port-A-Cath placement. 4) anemia of neoplastic disease: We will continue to monitor her hemoglobin and hematocrit closely and consider the patient for transfusion if needed. 5) COPD: The patient will continue on Symbicort, prednisone and albuterol. I will arrange for the patient to come back for follow-up visit in 2 weeks for evaluation and management of any adverse effect of her treatment. She was  seen during the multidisciplinary thoracic oncology clinic today by medical oncology, thoracic navigator, social worker and physical therapist. The patient voices understanding of current disease status and treatment options and is in agreement with the current care plan.  All questions were answered. The patient knows to call the clinic with any problems, questions or concerns. We can certainly see the patient much sooner if necessary.  I spent 35  minutes counseling the patient face to face. The total time spent in the appointment was 45 minutes.  Disclaimer: This note was dictated with voice recognition software. Similar sounding words can inadvertently be transcribed and may not be corrected upon review.

## 2016-10-24 NOTE — Progress Notes (Signed)
Assisted patient to car following [place].  Patient's oxygen tank appeared to be empty.  I told the patient that I would call her oxygen provider to have another tank brought to the cancer center but patient did not want to wait and wanted to get home.  Patient's sister said she did not live far and had additional tanks at home and that she would go in and get her a tank and bring it to the car before patient tried to go in to her home.  I offered again to call but patient declined.  Patient's color was good and lips were pink.  I called patient at home and she did have another tank and had changed to a full tank.

## 2016-10-24 NOTE — Progress Notes (Signed)
START ON PATHWAY REGIMEN - Non-Small Cell Lung  [id number]: Carboplatin AUC=5 + Pemetrexed 500 mg/m2 + Bevacizumab 15 mg/kg q21 Days x 4 Cycles   A cycle is every 21 days:     Carboplatin (Paraplatin(R)) AUC=5 in 250 mL NS IV over 1 hour Dose Mod: None     Pemetrexed (Alimta(R)) 500 mg/m2 in 100 mL NS IV over 10 minutes, manufacturer recommends not administering to patients with CrCl < 45 mL/min Dose Mod: None     Bevacizumab (Avastin(R)) 15 mg/kg in 100 mL NS IV over 90 minutes first infusion, 60 minutes second infusion and 30 minutes all subsequent infusions if tolerated Dose Mod: None Additional Orders: * All AUC calculations intended to be used in [employer] formula Note: Patient to receive the following prior to the initiation of therapy: 1) Dexamethasone 4 mg orally twice daily x 6 doses.  First dose 24 hours before chemotherapy. 2) Folic acid >= 826 mcg orally daily.  First dose at least 5 days prior to the first dose of pemetrexed. 3) Vitamin B12 1,000 mcg intramuscularly every 9 weeks.  First dose at least 5 days prior to the first dose of pemetrexed.  **Always confirm dose/schedule in your pharmacy ordering system**    Patient Characteristics: Stage IV Metastatic, Non Squamous, Initial Chemotherapy/Immunotherapy, PS = 0, 1, PD-L1 Expression Positive 1-49% (TPS) / Negative / Not Tested / Not a Candidate for Immunotherapy Check here if patient was staged using an edition prior to AJCC Staging - 8th Edition (i.e., prior to November 04, 2016)? false AJCC T Category: T3 Current Disease Status: Distant Metastases AJCC N Category: N2 AJCC M Category: M1c AJCC 8 Stage Grouping: IVB Histology: Non Squamous Cell ROS1 Rearrangement Status: Negative T790M Mutation Status: Not Applicable - EGFR Mutation Negative/Unknown Other Mutations/Biomarkers: No Other Actionable Mutations PD-L1 Expression Status: Quantity Not Sufficient Chemotherapy/Immunotherapy LOT: Initial  Chemotherapy/Immunotherapy Molecular Targeted Therapy: Not Appropriate ALK Translocation Status: Negative Would you be surprised if this patient died  in the next year? I would NOT be surprised if this patient died in the next year EGFR Mutation Status: Negative/Wild Type BRAF [id number] Mutation Status: Negative Performance Status: PS = 0, 1  Intent of Therapy: Non-Curative / Palliative Intent, Discussed with Patient

## 2016-10-24 NOTE — Progress Notes (Signed)
[address]       [place] [address]             [phone]                    Chrysa Poth [place] Medical Record #[id number] Date of Birth: 1946-06-25  Referring: Merrilee Seashore, MD Primary Care: Merrilee Seashore, MD  Chief Complaint:   No chief complaint on file.   History of Present Illness:    [name] 70 y.o. female is seen in the office  today for consideration of port placement. 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 She has had milgnant effusion on right, plurix place by radiology, but leaking around tube and was removed.   Current Activity/ Functional Status:  Patient is independent with mobility/ambulation, transfers, ADL's, IADL's.   Zubrod Score: At the time of surgery this patient's most appropriate activity status/level should be described as: '[]'$     0    Normal activity, no symptoms '[]'$     1    Restricted in physical strenuous activity but ambulatory, able to do out light work '[x]'$     2    Ambulatory and capable of self care, unable to do work activities, up and about               >50 % of waking hours                              '[]'$     3    Only limited self care, in bed greater than 50% of waking hours '[]'$     4    Completely disabled, no self care, confined to bed or chair '[]'$     5    Moribund   Past Medical History:  Diagnosis Date  . Cancer [facility])    right breast cancer  . COPD (chronic obstructive pulmonary disease) ([place])   . Encounter for antineoplastic chemotherapy 10/24/2016  . Oxygen dependent     Past Surgical History:  Procedure Laterality Date  . ABDOMINAL HYSTERECTOMY  1990  . BACK SURGERY    . BREAST  SURGERY Right    lumpectomy w/ radiation  . IR GENERIC HISTORICAL  10/07/2016   IR GUIDED DRAIN W CATHETER PLACEMENT 10/07/2016 Sandi Mariscal, MD [facility]-INTERV RAD  . IR GENERIC HISTORICAL  10/10/2016   IR REMOVAL OF PLURAL CATH W/CUFF 10/10/2016 Corrie Mckusick, DO [facility]-INTERV RAD    Family History  Problem Relation Age of Onset  . Breast cancer Mother   . AAA (abdominal aortic aneurysm) Father   . Lung cancer Sister   . Cancer Other   . Diabetes Neg Hx   . CVA Neg Hx   . CAD Neg Hx     Social History   Social History  . Marital status: Single    Spouse name: N/A  . Number of children: N/A  . Years of education: N/A   Occupational History  . Not on file.   Social History Main Topics  . Smoking status: Former Smoker    Packs/day: 3.00    Years: 39.00    Types: Cigarettes    Quit  date: 12/03/2013  . Smokeless tobacco: Never Used     Comment: Using e-cig frequently 04/15/14  . Alcohol use No  . Drug use: No  . Sexual activity: Not on file   Other Topics Concern  . Not on file   Social History Narrative  . No narrative on file    History  Smoking Status  . Former Smoker  . Packs/day: 3.00  . Years: 39.00  . Types: Cigarettes  . Quit date: 12/03/2013  Smokeless Tobacco  . Never Used    Comment: Using e-cig frequently 04/15/14    History  Alcohol Use No     No Known Allergies  Current Outpatient Prescriptions  Medication Sig Dispense Refill  . albuterol (PROVENTIL HFA;VENTOLIN HFA) 108 (90 BASE) MCG/ACT inhaler Inhale 2 puffs into the lungs every 6 (six) hours as needed for wheezing or shortness of breath.     Marland Kitchen atorvastatin (LIPITOR) 40 MG tablet Take 40 mg by mouth daily.    . bisoprolol (ZEBETA) 10 MG tablet Take 1 tablet (10 mg total) by mouth daily. 30 tablet 0  . dexamethasone (DECADRON) 4 MG tablet Take 1 tablet (4 mg total) by mouth 2 (two) times daily with a meal. 40 tablet 1  . feeding supplement, ENSURE ENLIVE, (ENSURE ENLIVE) LIQD Take 237 mLs by mouth 2  (two) times daily between meals. (Patient not taking: Reported on 10/24/2016) 660 mL 12  . folic acid (FOLVITE) 1 MG tablet Take 1 tablet (1 mg total) by mouth daily. 30 tablet 4  . FOLIC ACID PO Take 1 mg by mouth daily.     . furosemide (LASIX) 40 MG tablet Take 1 tablet (40 mg total) by mouth daily. 30 tablet 0  . guaiFENesin (MUCINEX) 600 MG 12 hr tablet Take 1 tablet (600 mg total) by mouth 2 (two) times daily. 10 tablet 0  . lidocaine-prilocaine (EMLA) cream Apply 1 application topically as needed. 30 g 0  . methotrexate (RHEUMATREX) 2.5 MG tablet Take 15 mg by mouth once a week. Every Tuesday.  0  . morphine (MS CONTIN) 30 MG 12 hr tablet Take 1 tablet (30 mg total) by mouth every 12 (twelve) hours. 60 tablet 0  . oxyCODONE-acetaminophen (PERCOCET) 10-325 MG per tablet Take 1 tablet by mouth every 6 (six) hours as needed for pain. (Patient taking differently: Take 1 tablet by mouth 5 (five) times daily as needed for pain. ) 30 tablet 0  . predniSONE (DELTASONE) 5 MG tablet Take 10 mg by mouth every morning.  0  . pregabalin (LYRICA) 200 MG capsule Take 200 mg by mouth daily.     . prochlorperazine (COMPAZINE) 10 MG tablet Take 1 tablet (10 mg total) by mouth every 6 (six) hours as needed for nausea or vomiting. 30 tablet 0  . SYMBICORT 160-4.5 MCG/ACT inhaler USE 2 PUFFS FIRST THING IN THE MORNING AND 2 PUFFS ABOUT 12 HOURS LATER 10.2 Inhaler 5   No current facility-administered medications for this visit.    Facility-Administered Medications Ordered in Other Visits  Medication Dose Route Frequency Provider Last Rate Last Dose  . fludeoxyglucose F - 18 (FDG) injection 63.0 millicurie  16.0 millicurie Intravenous Once PRN Genia Del, MD          Review of Systems:     Cardiac Review of Systems: Y or N  Chest Pain [   y ]  Resting SOB [ y  ] Exertional SOB  Blue.Reese  ]  Orthopnea [  y]  Pedal Edema [  n ]    Palpitations [ n ] Syncope  [n  ]   Presyncope [  y ]  General Review of  Systems: [Y] = yes [  ]=no Constitional: recent weight change [  y];  Wt loss over the last 3 months [   ] anorexia Blue.Reese  ]; fatigue [  y]; nausea [  ]; night sweats [  ]; fever [  ]; or chills [  ];          Dental: poor dentition[  ]; Last Dentist visit:   Eye : blurred vision [  ]; diplopia [   ]; vision changes [  ];  Amaurosis fugax[  ]; Resp: cough [  ];  wheezing[  ];  hemoptysis[  ]; shortness of breath[  ]; paroxysmal nocturnal dyspnea[  ]; dyspnea on exertion[  ]; or orthopnea[  ];  GI:  gallstones[  ], vomiting[  ];  dysphagia[  ]; melena[  ];  hematochezia [  ]; heartburn[  ];   Hx of  Colonoscopy[  ]; GU: kidney stones [  ]; hematuria[  ];   dysuria [  ];  nocturia[  ];  history of     obstruction [  ]; urinary frequency [  ]             Skin: rash, swelling[  ];, hair loss[  ];  peripheral edema[  ];  or itching[  ]; Musculosketetal: myalgias[  ];  joint swelling[  ];  joint erythema[  ];  joint pain[  ];  back pain[  ];  Heme/Lymph: bruising[  ];  bleeding[  ];  anemia[  ];  Neuro: TIA[  ];  headaches[  ];  stroke[  ];  vertigo[  ];  seizures[  ];   paresthesias[  ];  difficulty walking[  ];  Psych:depression[  ]; anxiety[  ];  Endocrine: diabetes[  ];  thyroid dysfunction[  ];  Immunizations: Flu up to date [  ]; Pneumococcal up to date [  ];  Other:  Physical Exam: See epic flow sheet  PHYSICAL EXAMINATION: General appearance: alert, cooperative, appears older than stated age and no distress Head: Normocephalic, without obvious abnormality, atraumatic Neck: no adenopathy, no carotid bruit, no JVD, supple, symmetrical, trachea midline and thyroid not enlarged, symmetric, no tenderness/mass/nodules Lymph nodes: Cervical, supraclavicular, and axillary nodes normal. Resp: diminished breath sounds RLL and RML Back: symmetric, no curvature. ROM normal. No CVA tenderness. Cardio: regular rate and rhythm, S1, S2 normal, no murmur, click, rub or gallop GI: soft, non-tender; bowel  sounds normal; no masses,  no organomegaly Extremities: extremities normal, atraumatic, no cyanosis or edema and Homans sign is negative, no sign of DVT Neurologic: Grossly normal  Diagnostic Studies & Laboratory data:     Recent Radiology Findings:   Ct Angio Chest Pe W Or Wo Contrast  Result Date: 10/14/2016 CLINICAL DATA:  Short of breath. Pleural drainage catheter removed 1 week prior. EXAM: CT ANGIOGRAPHY CHEST WITH CONTRAST TECHNIQUE: Multidetector CT imaging of the chest was performed using the standard protocol during bolus administration of intravenous contrast. Multiplanar CT image reconstructions and MIPs were obtained to evaluate the vascular anatomy. CONTRAST:  70 mL Isovue COMPARISON:  CT 09/24/2016 FINDINGS: Cardiovascular: Coronary artery calcification and aortic atherosclerotic calcification. No pericardial fluid. Mediastinum/Nodes: No axillary or supraclavicular adenopathy. No mediastinal adenopathy. Lungs/Pleura: There are multiple small pockets of loculated pleural fluid along the posterior aspect of the RIGHT hemithorax. Interval  reduction of moderate dependent effusion effusion seen on comparison exam. Some fluid extends along the fissure. No pneumothorax. Rounded nodule in the anterior RIGHT middle lobe measuring 9 mm (image 63, series 11. Nodule in the anterior RIGHT upper lobe additionally measuring 16 mm on image 44, series. These 2 pulmonary nodules are not changed comparison exam. LEFT lung clear Upper Abdomen: Limited view of the liver, kidneys, pancreas are unremarkable. Normal adrenal glands. Musculoskeletal: No aggressive osseous lesion. Review of the MIP images confirms the above findings. IMPRESSION: 1. Multiple small pockets of loculated pleural fluid within the RIGHT hemithorax replaces the previous seen moderate pleural effusion. 2. Stable pulmonary nodules the RIGHT upper lobe and RIGHT middle lobe of undetermined etiology. 3. No pneumothorax Electronically Signed    By: Suzy Bouchard M.D.   On: 10/14/2016 17:00   Ct Angio Chest Pe W Or Wo Contrast  Result Date: 09/25/2016 CLINICAL DATA:  Subacute onset of right-sided chest pain. Increasing respiratory distress. Leukocytosis and elevated D-dimer. Initial encounter. EXAM: CT ANGIOGRAPHY CHEST WITH CONTRAST TECHNIQUE: Multidetector CT imaging of the chest was performed using the standard protocol during bolus administration of intravenous contrast. Multiplanar CT image reconstructions and MIPs were obtained to evaluate the vascular anatomy. CONTRAST:  100 mL of Isovue 370 IV contrast COMPARISON:  None. FINDINGS: Cardiovascular: There is no evidence of significant pulmonary embolus. Evaluation for pulmonary embolus is suboptimal in areas of airspace consolidation and due to motion artifact. Scattered calcification is noted along the aortic arch and descending thoracic aorta. The heart remains borderline normal in size. Scattered coronary artery calcifications are seen. The great vessels are unremarkable in appearance. Mediastinum/Nodes: Visualized mediastinal nodes remain normal in size. No pericardial effusion is identified. Bilateral hypoattenuating nodules are noted within the thyroid gland, measuring up to 1.7 cm on the left. No axillary lymphadenopathy is seen. The patient is status post right-sided mastectomy. Lungs/Pleura: A moderate right-sided pleural effusion is noted, with partial consolidation at the right lung base. There is a 1.7 x 1.0 cm pleural-based nodule anteriorly at the right middle lobe (image 49 of 95). Malignancy cannot be excluded. No pneumothorax is seen. Upper Abdomen: The visualized portions of the liver and spleen are grossly unremarkable. The visualized portions of the adrenal glands are within normal limits. Musculoskeletal: No acute osseous abnormalities are identified. An hemangioma is noted at vertebral body T9. The visualized musculature is unremarkable in appearance. Review of the MIP  images confirms the above findings. IMPRESSION: 1. No evidence of significant pulmonary embolus. 2. Moderate right-sided pleural effusion, with partial consolidation at the right lung base. This could reflect pneumonia. 3. Apparent 1.7 x 1.0 cm pleural-based nodule anteriorly at the right middle lobe. Malignancy cannot be excluded. PET/CT would be helpful for further evaluation. Alternatively, diagnostic thoracentesis might be considered. 4. **An incidental finding of potential clinical significance has been found. 1.7 cm hypoattenuating nodule at the left thyroid lobe. Consider further evaluation with thyroid ultrasound. If patient is clinically hyperthyroid, consider nuclear medicine thyroid uptake and scan.** 5. Scattered coronary artery calcifications seen. 6. 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 Electronically Signed   By: Garald Balding M.D.   On: 09/25/2016 00:58   Mr Jeri Cos GG Contrast  Result Date: 10/04/2016 CLINICAL DATA:  New diagnosis of lung cancer. History of breast cancer, hypertension rheumatoid arthritis. Assess for intracranial metastasis. EXAM: MRI HEAD WITHOUT AND WITH CONTRAST TECHNIQUE: Multiplanar, multiecho pulse sequences of the brain and surrounding structures were obtained without and with intravenous contrast. CONTRAST:  67m MULTIHANCE GADOBENATE DIMEGLUMINE 529 MG/ML IV SOLN COMPARISON:  None. FINDINGS: Mild motion degraded examination. INTRACRANIAL CONTENTS: No reduced diffusion to suggest acute ischemia or hypercellular tumor. No susceptibility artifact to suggest hemorrhage. The ventricles and sulci are normal for patient's age. Patchy supratentorial white matter FLAIR T2 hyperintensities are less than expected for age, compatible with chronic small vessel ischemic disease. No suspicious parenchymal signal, masses, mass effect. No abnormal intraparenchymal or extra-axial enhancement. No abnormal extra-axial fluid collections. No extra-axial masses. VASCULAR: Normal major  intracranial vascular flow voids present at skull base. SKULL AND UPPER CERVICAL SPINE: No abnormal sellar expansion. No suspicious calvarial bone marrow signal. Craniocervical junction maintained. Small RIGHT exostosis. SINUSES/ORBITS: RIGHT maxillary mucosal retention cyst. Small RIGHT mastoid effusion.The included ocular globes and orbital contents are non-suspicious. OTHER: None. IMPRESSION: No intracranial metastasis. Negative motion degraded MRI head with and without contrast for age. Electronically Signed   By: CElon AlasM.D.   On: 10/04/2016 18:35   Ir Guided DNiel HummerW Catheter Placement  Result Date: 10/07/2016 CLINICAL DATA:  History of lung cancer, now with recurrent symptomatic right-sided malignant effusion. Please perform tunneled right-sided pleural drainage catheter for palliative purposes. EXAM: INSERTION OF TUNNELED RIGHT SIDED PLEURAL DRAINAGE CATHETER COMPARISON:  Ultrasound-guided thoracentesis - 10/01/2016; 09/25/2016; chest radiograph - 10/07/2016 MEDICATIONS: Ancef 2 gm IV; Antibiotic was administered in an appropriate time interval for the procedure. ANESTHESIA/SEDATION: Moderate (conscious) sedation was employed during this procedure. A total of Versed 2 mg and Fentanyl 50 mcg was administered intravenously. Moderate Sedation Time: 14 minutes. The patient's level of consciousness and vital signs were monitored continuously by radiology nursing throughout the procedure under my direct supervision. FLUOROSCOPY TIME:  FLUOROSCOPY TIME 36 seconds (240.1mGy) COMPLICATIONS: None immediate. PROCEDURE: The procedure, risks, benefits, and alternatives were explained to the patient, who wish to proceed with the placement of this permanent pleural catheter as the patient is seeking palliative care. The patient understand and consent to the procedure. The right inferior lateral chest and upper abdomen were prepped with Chlorhexidine in a sterile fashion, and a sterile drape was applied  covering the operative field. A sterile gown and sterile gloves were used for the procedure. Initial ultrasound scanning and fluoroscopic imaging demonstrates a recurrent moderate to large pleural effusion. Under direct ultrasound guidance, the right inferior lateral pleural space was accessed with a Yueh sheath needle after the overlying soft tissues were anesthetized with 1% lidocaine with epinephrine. An Amplatz super stiff wire was then advanced under fluoroscopy into the pleural space. A 15.5 French tunneled Pleur-X catheter was tunneled from an incision within the right upper abdominal quadrant to the access site. The pleural access site was serially dilated under fluoroscopy, ultimately allowing placement of a peel-away sheath. The catheter was advanced through the peel-away sheath. The sheath was then removed. Final catheter positioning was confirmed with a fluoroscopic radiographic image. The access incision was closed with subcutaneous subcuticular 4-0 Vicryl, Dermabond and Steri-Strips. A Prolene retention suture was applied at the catheter exit site. Large volume thoracentesis was performed through the new catheter utilizing provided bulb vacuum assisted drainage bag. The patient tolerated the above procedure well without immediate postprocedural complication. FINDINGS: Preprocedural ultrasound scanning demonstrates a recurrent moderate sized right sided pleural effusion. After ultrasound and fluoroscopic guided placement, the catheter is coiled within the caudal aspect of the right hemi thorax at the location of the patient's recurrent malignant effusion. Following catheter placement, approximately 1.3 L of serous though slightly blood tinged pleural fluid was removed.  IMPRESSION: Successful placement of permanent, tunneled right pleural drainage catheter via lateral approach. Approximately 1.3 L of serous though slightly blood tinged pleural fluid was removed after catheter placement. Electronically  Signed   By: Sandi Mariscal M.D.   On: 10/07/2016 16:42   Nm Pet Image Initial (pi) Skull Base To Thigh  Result Date: 10/22/2016 CLINICAL DATA:  Initial treatment strategy for right lung adenocarcinoma. EXAM: NUCLEAR MEDICINE PET SKULL BASE TO THIGH TECHNIQUE: 11.4 mCi F-18 FDG was injected intravenously. Full-ring PET imaging was performed from the skull base to thigh after the radiotracer. CT data was obtained and used for attenuation correction and anatomic localization. FASTING BLOOD GLUCOSE:  Value: 160 mg/dl COMPARISON:  Chest CTA on 10/14/2016 FINDINGS: NECK No hypermetabolic lymph nodes in the neck. CHEST Small multiloculated right pleural effusion shows diffuse hypermetabolic activity, consistent with malignant pleural effusion. A spiculated nodule in the anterior right upper lobe abutting the pleural surface on image 96/0 is hypermetabolic, with SUV max of 12.2. A 10 mm smoothly marginated pulmonary nodule in the right middle lobe on image 48/7 shows mild hypermetabolism with SUV max of 4.4. No suspicious pulmonary nodule seen in the left lung. 8 mm subcarinal lymph node on image 45/4 is hypermetabolic with SUV max of 8.3. Hypermetabolic activity also seen in right hilum with SUV max of 12.2 ABDOMEN/PELVIS Multiple small hypermetabolic lesions are seen throughout the right and left hepatic lobes, consistent with diffuse liver metastases. 1.8 cm left adrenal mass is hypermetabolic with SUV max of 09.8, consistent with left adrenal metastasis. No other hypermetabolic masses or lymphadenopathy identified within the abdomen or pelvis. Colonic diverticulosis is noted, without evidence of diverticulitis. Previous hysterectomy. SKELETON Diffuse hypermetabolic lytic bone metastases are seen throughout the spine, bilateral ribs, bilateral scapulae and right humeral head, pelvis, and bilateral hips, consistent with diffuse bone metastases. Several pathologic right rib fractures are noted. IMPRESSION: 16 mm  hypermetabolic spiculated nodule in the anterior right upper lobe, and 10 mm hypermetabolic smoothly marginated nodule in right middle lobe, which could represent primary bronchogenic carcinoma or pulmonary metastases. Small malignant multiloculated right pleural effusion. Small hypermetabolic right hilar and subcarinal mediastinal lymph nodes, consistent with metastatic disease. Diffuse liver metastases. Small left adrenal metastasis. Diffuse lytic bone metastases. Several pathologic right rib fractures also noted. Electronically Signed   By: Earle Gell M.D.   On: 10/22/2016 11:53   DUs Thyroid  Result Date: 09/27/2016 CLINICAL DATA:  Incidental on CT.  Thyroid nodule seen on CT. EXAM: THYROID ULTRASOUND TECHNIQUE: Ultrasound examination of the thyroid gland and adjacent soft tissues was performed. COMPARISON:  Chest CT 09/24/2016 FINDINGS: Parenchymal Echotexture: Mildly heterogenous Estimated total number of nodules >/= 1 cm: 3 Number of spongiform nodules >/=  2 cm not described below (TR1): 0 Number of mixed cystic and solid nodules >/= 1.5 cm not described below (TR2): 0 _________________________________________________________ Isthmus: Measures 0.4 cm in thickness. No discrete nodules are identified within the thyroid isthmus. _________________________________________________________ Right lobe: Measures 4.2 x 1.6 x 2.0 cm. Nodule # 1: Location: Isthmus; Superior Size: 1.6 x 1.3 x 1.4 cm. Composition: solid/almost completely solid (2), there is an eccentric cystic component. Echogenicity: isoechoic (1) Shape: not taller-than-wide (0) Margins: ill-defined (0) Echogenic foci: none (0) ACR TI-RADS total points: 3. ACR TI-RADS risk category: TR3 (3 points). ACR TI-RADS recommendations: *Given size (>/= 1.5 - 2.4 cm) and appearance, a follow-up ultrasound in 1 year should be considered based on TI-RADS criteria. Nodule # 2: Location: Right; Inferior Size: 0.6 x  0.4 x 0.5 cm Composition: solid/almost  completely solid (2) Echogenicity: hypoechoic (2) Shape: not taller-than-wide (0) Margins: ill-defined (0) Echogenic foci: none (0) ACR TI-RADS total points: 4. ACR TI-RADS risk category: TR4 (4-6 points). ACR TI-RADS recommendations: Given size (<0.9 cm) and appearance, this nodule does NOT meet TI-RADS criteria for biopsy or dedicated follow-up. _________________________________________________________ Left lobe: Measures 4.1 x 1.6 x 1.9 cm. Nodule # 3: Location: Left; Superior Size: 1.0 x 1.0 x 1.0 cm. Composition: mixed cystic and solid (1) Echogenicity: hypoechoic (2) Shape: not taller-than-wide (0) Margins: smooth (0) Echogenic foci: none (0) ACR TI-RADS total points: 3. ACR TI-RADS risk category: TR3 (3 points). ACR TI-RADS recommendations: Given size (<1.4 cm) and appearance, this nodule does NOT meet TI-RADS criteria for biopsy or dedicated follow-up. There is a cystic structure in the superior left thyroid lobe measuring 1.0 x 0.7 x 0.7 cm. IMPRESSION: Solid and cystic nodules in the thyroid. Largest nodule measures 1.6 cm in the superior right lobe. This nodule meets criteria for 1 year follow-up. The above is in keeping with the ACR TI-RADS recommendations - J Am Coll Radiol 2017;14:587-595. Electronically Signed   By: Markus Daft M.D.   On: 09/27/2016 07:59   Ir Removal Of Plural Cath W/cuff  Result Date: 10/10/2016 INDICATION: 70 year old female with a history of malignant right-sided pleural effusion and PleurX catheter placed 10/07/2016. She presents today for malfunctioning catheter. EXAM: IR REMOVAL OF PLEURAL CATH WITH CUFF MEDICATIONS: The patient is currently admitted to the hospital and receiving intravenous antibiotics. The antibiotics were administered within an appropriate time frame prior to the initiation of the procedure. ANESTHESIA/SEDATION: None COMPLICATIONS: None PROCEDURE: Informed written consent was obtained from the patient after a thorough discussion of the procedural risks,  benefits and alternatives. All questions were addressed. Maximal Sterile Barrier Technique was utilized including caps, mask, sterile gowns, sterile gloves, sterile drape, hand hygiene and skin antiseptic. A timeout was performed prior to the initiation of the procedure. Patient was positioned supine position on the fluoroscopy table. 1% lidocaine was used for local anesthesia. Spot images of the chest were performed. Contrast injection was performed of the catheter. Glidewire was advanced through the catheter in attempt to reposition. Retention suture was ligated in order to Dermabond the skin. Catheter was damaged during the manipulation requiring attempt at exchange. Stiff wire was advanced through the catheter which was removed from the stiff wire. Combination of the peel-away sheath, Kumpe catheter were used in order to stiff in the tract an allow passage of the new catheter over the wiring catheter combination. Exchange of the catheter failed, with inability to passed through the soft tissue tract and enter the pleura. Catheter was removed and dressing was placed. Patient tolerated the procedure well and remained hemodynamically stable throughout. No complications were encountered and no significant blood loss encountered. FINDINGS: Initial image demonstrates catheter coiled at the base of the lung, potentially subpulmonic. Contrast injection demonstrates contrast accumulating within the pleural is a space. Wire manipulation failed to manipulate the catheter to more superior position. Images demonstrate failed catheter exchange given the damaged catheter. IMPRESSION: Status post failed attempt at exchange of a tunneled right chest PleurX catheter. Signed, Dulcy Fanny. Earleen Newport, DO [facility] Radiology PLAN: The patient will return for a scheduled visit at the [facility] with the chest x-ray to determine need for new PleurX catheter placement versus potential repeat  thoracentesis preceding her oncology plan. Electronically Signed   By: Corrie Mckusick D.O.  On: 10/10/2016 16:52     I have independently reviewed the above radiologic studies.  Recent Lab Findings: Lab Results  Component Value Date   WBC 11.6 (H) 10/24/2016   HGB 10.5 (L) 10/24/2016   HCT 34.2 (L) 10/24/2016   PLT 302 10/24/2016   GLUCOSE 137 10/24/2016   CHOL 152 12/05/2013   TRIG 155 (H) 12/05/2013   HDL 48 12/05/2013   LDLCALC 73 12/05/2013   ALT 25 10/24/2016   AST 33 10/24/2016   NA 142 10/24/2016   K 3.3 (L) 10/24/2016   CL 101 10/17/2016   CREATININE 0.7 10/24/2016   BUN 19.3 10/24/2016   CO2 31 (H) 10/24/2016   TSH 0.083 (L) 10/15/2016   INR 0.97 09/25/2016   HGBA1C 6.4 (H) 12/03/2013      Assessment / Plan:   Patient to start chemo in the coming week. Asked by Dr Julien Nordmann to place portacath. Offered to patient to proceed  tomorrow morning, but she declined and wanted to have done in afternoon next wee. She notes she is too tired to proceed tomorrow. Will see if Dr Roxan Hockey can do next week.   I have discussed with her port placement including risks.    I  spent 20 minutes counseling the patient face to face and 50% or more the  time was spent in counseling and coordination of care. The total time spent in the appointment was 30 minutes.  Grace Isaac MD      [address] [place] [address] Office [phone]   Beeper [phone]  10/24/2016 6:26 PM

## 2016-10-24 NOTE — Progress Notes (Signed)
[place] Clinical Social Work  Clinical Social Work met with patient/family  at [employer] appointment to offer support and assess for psychosocial needs.  Patient was accompanied by her sister, Baker Janus.  The patient shared she has two sisters, Baker Janus and Dorian Pod.  Patient lives with sister, Dorian Pod, states sister is somewhat supportive in helping at home, but not supportive emotionally.  Patient's sister Baker Janus brings her to medical appointments and serves as a strong support.  The patient shared her emotional concerns are related to "losing my independence" the last few weeks.  She reported she is in significant pain and is unable to perform majority of daily living activities.  CSW provided brief emotional support and discussed additional support available at the cancer center including counseling and support groups.  Patient indicated insurance was a concern with the uncertainty of what insurance would cover and food because she has had no appetite.  CSW encouraged patient to meet with dietitian regarding loss of appetite and will notify dietitian.    ONCBCN DISTRESS SCREENING 10/24/2016  Screening Type Initial Screening  Distress experienced in past week (1-10) 10  Practical problem type Insurance;Food  Emotional problem type Depression;Nervousness/Anxiety;Adjusting to illness;Isolation/feeling alone  Physical Problem type Pain;Nausea/vomiting;Getting around;Bathing/dressing;Breathing;Sleep/insomnia;Mouth sores/swallowing;Loss of appetitie;Constipation/diarrhea;Changes in urination;Tingling hands/feet;Skin dry/itchy  Referral to clinical social work Yes    Clinical Social Work briefly discussed Clinical Social Work role and [employer] support programs/services.  Clinical Social Work encouraged patient to call with any additional questions or concerns.   Polo Riley, MSW, LCSW, OSW-C Clinical Social Worker [facility] [phone]

## 2016-10-24 NOTE — Therapy (Signed)
[place], [place], [address] Phone: [phone]   Fax:  [phone]  Physical Therapy Evaluation  Patient Details  Name: [name] MRN: [id number] Date of Birth: 1946-10-14 Referring Provider: Dr. Curt Bears  Encounter Date: 10/24/2016      PT End of Session - 10/24/16 1801    Visit Number 1   Number of Visits 1   PT Start Time 2482   PT Stop Time 1630   PT Time Calculation (min) 27 min   Activity Tolerance Patient limited by pain   Behavior During Therapy [facility] for tasks assessed/performed      Past Medical History:  Diagnosis Date  . Cancer [facility])    right breast cancer  . COPD (chronic obstructive pulmonary disease) ([place])   . Encounter for antineoplastic chemotherapy 10/24/2016  . Oxygen dependent     Past Surgical History:  Procedure Laterality Date  . ABDOMINAL HYSTERECTOMY  1990  . BACK SURGERY    . BREAST SURGERY Right    lumpectomy w/ radiation  . IR GENERIC HISTORICAL  10/07/2016   IR GUIDED DRAIN W CATHETER PLACEMENT 10/07/2016 Sandi Mariscal, MD [facility]-INTERV RAD  . IR GENERIC HISTORICAL  10/10/2016   IR REMOVAL OF PLURAL CATH W/CUFF 10/10/2016 Corrie Mckusick, DO [facility]-INTERV RAD    There were no vitals filed for this visit.       Subjective Assessment - 10/24/16 1748    Subjective Having a lot of pain.   Patient is accompained by: Family member  sister   Pertinent History Patient presented with SOB.  Work-up showed right pleural effusion.  Further workup found pleural fluid positive for adenocarcinoma and that pt. has multiple bone lesions, lymphadenopathy so is stage IV.  She is expected to have chemotherapy and palliative radiation.  Ex-smoker quit 11/2013 after 117 pack-years; on O2.  h/o breast cancer s/p lumpectomy and radiation.   Patient Stated Goals get infor from all lung clinic providers   Currently in Pain? Yes   Pain Score 10-Worst pain ever   Pain Location Back   Pain  Orientation Left;Lower   Pain Descriptors / Indicators Sharp   Aggravating Factors  walking, moving   Pain Relieving Factors being still            [facility] PT Assessment - 10/24/16 0001      Assessment   Medical Diagnosis stage IV lung adenocarcinoma   Referring Provider Dr. Curt Bears   Onset Date/Surgical Date 09/25/16   Prior Therapy none, but says she is to start home health PT soon     Precautions   Precautions Other (comment)   Precaution Comments bony metastases     Restrictions   Weight Bearing Restrictions No     Balance Screen   Has the patient fallen in the past 6 months No   Has the patient had a decrease in activity level because of a fear of falling?  No   Is the patient reluctant to leave their home because of a fear of falling?  No     Home Environment   Living Environment Private residence   Type of Home Apartment   Home Access Stairs to enter   Entrance Stairs-Number of Steps 8   Entrance Stairs-Rails Left     Prior Function   Level of Independence Requires assistive device for independence   Leisure no regular exercise     Cognition   Overall Cognitive Status Within Functional Limits for tasks assessed  Observation/Other Assessments   Observations heavy woman sitting in a wheelchair with O2 via nasal canula     Functional Tests   Functional tests Sit to Stand     Sit to Stand   Comments unable to stand without pushing off from arms     Posture/Postural Control   Posture/Postural Control Postural limitations   Postural Limitations Rounded Shoulders;Forward head  in wheelchair     ROM / Strength   AROM / PROM / Strength AROM     AROM   Overall AROM Comments unable to assess trunk AROM due to pain and bony metastases     Ambulation/Gait   Ambulation/Gait No   Assistive device Rolling walker  pt. in [facility] today; has walker at home   Gait Comments sister reports she can walk 10-12 steps at a time     Balance   Balance Assessed No   unable due to pain                           PT Education - 10/24/16 1800    Education provided Yes   Education Details energy conservation, posture, breathing, Cure article on staying active, PT info   Person(s) Educated Patient;Other (comment)  sister   Methods Explanation;Handout   Comprehension Verbalized understanding               Lung Clinic Goals - 10/24/16 1809      Patient will be able to verbalize understanding of the benefit of exercise to decrease fatigue.   Status Achieved     Patient will be able to verbalize the importance of posture.   Status Achieved     Patient will be able to demonstrate diaphragmatic breathing for improved lung function.   Status Achieved     Patient will be able to verbalize understanding of the role of physical therapy to prevent functional decline and who to contact if physical therapy is needed.   Status Achieved             Plan - 10/24/16 1802    Clinical Impression Statement Pt.'s status was discussed at multidisciplinary conference prior to assessment.  Patient with stage IV adenocarcinoma of the lung with a high level of pain that limited therapist's ability to assess many things today.  She was in a wheelchair and reported 10/10 pain, made worse with movement.  She is expected to have chemotherapy and possibly radiation.  She reports significantly limited mobility currently, using a walker some of the time and negotiating steps with a railing on one side and holding to the wall on the opposite side.   Rehab Potential Fair   PT Frequency One time visit   PT Treatment/Interventions Patient/family education   PT Next Visit Plan None at this time.   Recommended Other Services She reports she expects to start home health physical therapy soon.   Consulted and Agree with Plan of Care Patient      Patient will benefit from skilled therapeutic intervention in order to improve the following deficits and  impairments:  Pain, Decreased mobility, Difficulty walking  Visit Diagnosis: Acute left-sided low back pain with left-sided sciatica - Plan: PT plan of care cert/re-cert  Other abnormalities of gait and mobility - Plan: PT plan of care cert/re-cert      G-Codes - 70/35/00 1809    Functional Limitation Mobility: Walking and moving around   Mobility: Walking and Moving Around Current Status ([id number]) At  least 80 percent but less than 100 percent impaired, limited or restricted   Mobility: Walking and Moving Around Goal Status [phone]) At least 80 percent but less than 100 percent impaired, limited or restricted   Mobility: Walking and Moving Around Discharge Status [phone]) At least 80 percent but less than 100 percent impaired, limited or restricted       Problem List Patient Active Problem List   Diagnosis Date Noted  . Encounter for antineoplastic chemotherapy 10/24/2016  . COPD exacerbation ([place]) 10/14/2016  . Pleural effusion 10/14/2016  . Malignant pleural effusion 10/08/2016  . Adenocarcinoma of lung, right ([place]) 10/08/2016  . Essential hypertension 10/02/2016  . Acute on chronic respiratory failure with hypoxia ([place]) 10/02/2016  . Recurrent right pleural effusion 10/02/2016  . Leukocytosis 10/02/2016  . Hypokalemia 10/02/2016  . Right lower lobe pneumonia ([place]) 10/02/2016  . Thyroid nodule 10/02/2016  . Adenocarcinoma of right lung, stage 4 ([place]) 10/02/2016  . Chest pain in adult   . Exudative pleural effusion   . Lung nodule seen on imaging study 09/26/2016  . COPD GOLD II 12/03/2013    Samatha Anspach 10/24/2016, 6:12 PM  [place], [place], [address] Phone: [phone]   Fax:  [phone]  Name: Averyana Pillars MRN: [id number] Date of Birth: 01-Apr-1946  Serafina Royals, PT 10/24/16 6:12 PM

## 2016-10-24 NOTE — Progress Notes (Signed)
Thoracic Location of Tumor / Histology: stage IV non-small cell lung cancer, adenocarcinoma with negative EGFR, ALK, ROS 1, BRAF mutations. 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  Patient presented with symptoms of: significant shortness breath and CT of the chest at that time showed large right pleural effusion.   Biopsies revealed:   10/01/16 Diagnosis PLEURAL FLUID, RIGHT (SPECIMEN 1 OF 1 COLLECTED 10/01/16): POSITIVE FOR ADENOCARCINOMA.  09/25/16 Diagnosis PLEURAL FLUID, RIGHT (SPECIMEN 1 OF 1 COLLECTED 09/25/16): ADENOCARCINOMA, CONSISTENT WITH LUNG PRIMARY.  Tobacco/Marijuana/Snuff/ETOH use: former smoker.  Smoked 3 ppd for 39 years.  Denies marijuana, snuff, ETOH use.  Past/Anticipated interventions by cardiothoracic surgery, if any: no  Past/Anticipated interventions by medical oncology, if any: systemic chemotherapy with carboplatin for AUC of 5, Alimta 500 MG/M2 and Avastin 15 MG/KG every 3 weeks to start on 10/31/16.  Signs/Symptoms  Weight changes, if any: unknown  Respiratory complaints, if any: shortness of breath, uses 3 L of oxygen via nasal canula.  Hemoptysis, if any: no  Pain issues, if any:  Yes - pain in her lower back and left hip at a 10/10.  She is taking Morphine 30 mg q 12 hours and percocet for breakthrough.  She has not started taking decadron yet.  SAFETY ISSUES:  Prior radiation? Yes - to right breast in [place] 40 years ago  Pacemaker/ICD? no   Possible current pregnancy?no  Is the patient on methotrexate? No - on hold  Current Complaints / other details:  Dr. Julien Nordmann is recommending palliative radiotherapy for worsening back and left hip pain.  Patient said she is not able to walk.  She is using a wheelchair today.  She is here  with her sister.  BP 118/87 (BP Location: Left Arm, Patient Position: Sitting)   Pulse (!) 111   Temp 99.1 F (37.3 C)   Ht 5' 6"  (1.676 m)   SpO2 95% Comment: 3L   Wt Readings from Last 3 Encounters:  10/15/16 230 lb 2.6 oz (104.4 kg)  09/24/16 241 lb 10 oz (109.6 kg)  03/09/15 268 lb (121.6 kg)

## 2016-10-25 ENCOUNTER — Ambulatory Visit
Admission: RE | Admit: 2016-10-25 | Discharge: 2016-10-25 | Disposition: A | Discharge status: Home / Self Care | Payer: Medicare Other | Source: Ambulatory Visit | Attending: Radiation Oncology | Admitting: Radiation Oncology

## 2016-10-25 ENCOUNTER — Ambulatory Visit: Admit: 2016-10-25 | Payer: Medicare Other | Admitting: Cardiothoracic Surgery

## 2016-10-25 ENCOUNTER — Encounter: Payer: Self-pay | Admitting: Radiation Oncology

## 2016-10-25 ENCOUNTER — Encounter (HOSPITAL_COMMUNITY): Payer: Self-pay

## 2016-10-25 VITALS — BP 118/87 | HR 111 | Temp 99.1°F | Ht 66.0 in

## 2016-10-25 DIAGNOSIS — J910 Malignant pleural effusion: Secondary | ICD-10-CM | POA: Insufficient documentation

## 2016-10-25 DIAGNOSIS — R911 Solitary pulmonary nodule: Secondary | ICD-10-CM | POA: Diagnosis not present

## 2016-10-25 DIAGNOSIS — Z79899 Other long term (current) drug therapy: Secondary | ICD-10-CM | POA: Insufficient documentation

## 2016-10-25 DIAGNOSIS — C3491 Malignant neoplasm of unspecified part of right bronchus or lung: Secondary | ICD-10-CM

## 2016-10-25 DIAGNOSIS — C7951 Secondary malignant neoplasm of bone: Secondary | ICD-10-CM | POA: Insufficient documentation

## 2016-10-25 DIAGNOSIS — R590 Localized enlarged lymph nodes: Secondary | ICD-10-CM | POA: Diagnosis not present

## 2016-10-25 SURGERY — INSERTION, TUNNELED CENTRAL VENOUS DEVICE, WITH PORT
Anesthesia: Monitor Anesthesia Care | Site: Chest | Laterality: Bilateral

## 2016-10-25 NOTE — Progress Notes (Signed)
Please see the Nurse Progress Note in the MD Initial Consult Encounter for this patient. 

## 2016-10-25 NOTE — Progress Notes (Signed)
Radiation Oncology         ([phone]) [phone] ________________________________  Initial outpatient Consultation  Name: [name] MRN: [id number]  Date: 10/25/2016  DOB: 1946/08/13  [id number],HALPF, MD  Curt Bears, MD   REFERRING PHYSICIAN: Curt Bears, MD  DIAGNOSIS: Stage IV (T3, N2, M1 B) non-small cell lung cancer, adenocarcinoma presented with right upper lobe pulmonary nodule in addition to satellite nodule in the right middle lobe, mediastinal lymphadenopathy as well as malignant right pleural effusion, liver, left adrenal and bone metastasis diagnosed in November 2017   HISTORY OF PRESENT ILLNESS::[name] 70 y.o. female who is seen out of the courtesy of Dr. Julien Nordmann for an opinion concerning radiation therapy as part of management of patient's advanced non-small cell lung cancer. She returns to the clinic today  accompanied by her sister Baker Janus. The patient  presented with significant shortness breath and CT of the chest at that time showed large right pleural effusion. She underwent ultrasound-guided right thoracentesis and the final pathology was consistent with adenocarcinoma of lung primary. She also had a right Pleurx catheter placed by interventional radiology but continues to have leak around the catheter and this was removed. During her hospitalization she had MRI of the brain that showed no evidence for metastatic disease to the brain. The patient had a PET scan performed recently . She also had molecular studies performed by [facility] one recently. She continues to have increasing fatigue and weakness she is very anxious. She has lack of appetite and lost a lot of weight recently. She also has low back pain more to the left and close to the left hip. She has no chest pain but continues to have shortness breath at baseline and increased with exertion and she is currently on home oxygen. She has mild cough with no hemoptysis. She also complains of nausea and diarrhea  but no vomiting or constipation.   PREVIOUS RADIATION THERAPY: No  PAST MEDICAL HISTORY:  has a past medical history of Back pain (10/24/2016); Cancer [facility]); COPD (chronic obstructive pulmonary disease) ([place]); Encounter for antineoplastic chemotherapy (10/24/2016); Goals of care, counseling/discussion (10/24/2016); and Oxygen dependent.    PAST SURGICAL HISTORY: Past Surgical History:  Procedure Laterality Date  . ABDOMINAL HYSTERECTOMY  1990  . BACK SURGERY    . BREAST SURGERY Right    lumpectomy w/ radiation  . IR GENERIC HISTORICAL  10/07/2016   IR GUIDED DRAIN W CATHETER PLACEMENT 10/07/2016 Sandi Mariscal, MD [facility]-INTERV RAD  . IR GENERIC HISTORICAL  10/10/2016   IR REMOVAL OF PLURAL CATH W/CUFF 10/10/2016 Corrie Mckusick, DO [facility]-INTERV RAD    FAMILY HISTORY: family history includes AAA (abdominal aortic aneurysm) in her father; Breast cancer in her mother; Cancer in her other; Lung cancer in her sister.  SOCIAL HISTORY:  reports that she quit smoking about 2 years ago. Her smoking use included Cigarettes. She has a 117.00 pack-year smoking history. She has never used smokeless tobacco. She reports that she does not drink alcohol or use drugs.  ALLERGIES: Patient has no known allergies.  MEDICATIONS:  Current Outpatient Prescriptions  Medication Sig Dispense Refill  . albuterol (PROVENTIL HFA;VENTOLIN HFA) 108 (90 BASE) MCG/ACT inhaler Inhale 2 puffs into the lungs every 6 (six) hours as needed for wheezing or shortness of breath.     Marland Kitchen atorvastatin (LIPITOR) 40 MG tablet Take 40 mg by mouth daily.    . bisoprolol (ZEBETA) 10 MG tablet Take 1 tablet (10 mg total) by mouth daily. 30 tablet 0  . folic  acid (FOLVITE) 1 MG tablet Take 1 tablet (1 mg total) by mouth daily. 30 tablet 4  . FOLIC ACID PO Take 1 mg by mouth daily.     . furosemide (LASIX) 40 MG tablet Take 1 tablet (40 mg total) by mouth daily. 30 tablet 0  . guaiFENesin (MUCINEX) 600 MG 12 hr tablet Take 1 tablet (600 mg total) by  mouth 2 (two) times daily. 10 tablet 0  . morphine (MS CONTIN) 30 MG 12 hr tablet Take 1 tablet (30 mg total) by mouth every 12 (twelve) hours. 60 tablet 0  . oxyCODONE-acetaminophen (PERCOCET) 10-325 MG per tablet Take 1 tablet by mouth every 6 (six) hours as needed for pain. (Patient taking differently: Take 1 tablet by mouth 5 (five) times daily as needed for pain. ) 30 tablet 0  . pregabalin (LYRICA) 200 MG capsule Take 200 mg by mouth daily.     . prochlorperazine (COMPAZINE) 10 MG tablet Take 1 tablet (10 mg total) by mouth every 6 (six) hours as needed for nausea or vomiting. 30 tablet 0  . SYMBICORT 160-4.5 MCG/ACT inhaler USE 2 PUFFS FIRST THING IN THE MORNING AND 2 PUFFS ABOUT 12 HOURS LATER 10.2 Inhaler 5  . dexamethasone (DECADRON) 4 MG tablet Take 1 tablet (4 mg total) by mouth 2 (two) times daily with a meal. (Patient not taking: Reported on 10/25/2016) 40 tablet 1  . feeding supplement, ENSURE ENLIVE, (ENSURE ENLIVE) LIQD Take 237 mLs by mouth 2 (two) times daily between meals. (Patient not taking: Reported on 10/25/2016) 237 mL 12  . lidocaine-prilocaine (EMLA) cream Apply 1 application topically as needed. (Patient not taking: Reported on 10/25/2016) 30 g 0  . methotrexate (RHEUMATREX) 2.5 MG tablet Take 15 mg by mouth once a week. Every Tuesday.  0  . predniSONE (DELTASONE) 5 MG tablet Take 10 mg by mouth every morning.  0   No current facility-administered medications for this encounter.    Facility-Administered Medications Ordered in Other Encounters  Medication Dose Route Frequency Provider Last Rate Last Dose  . fludeoxyglucose F - 18 (FDG) injection 00.9 millicurie  38.1 millicurie Intravenous Once PRN Genia Del, MD        REVIEW OF SYSTEMS:  A 12 point review of systems is documented in the electronic medical record. This was obtained by the nursing staff. However, I reviewed this with the patient to discuss relevant findings and make    PHYSICAL EXAM:  height is '5\' 6"'$   (1.676 m). Her temperature is 99.1 F (37.3 C). Her blood pressure is 118/87 and her pulse is 111 (abnormal). Her oxygen saturation is 95%.   General: Alert and oriented, in no acute distress, sits in a wheelchair during the examination, supplemental oxygen in place by nasal cannula, 3 L, appears quite fatigued HEENT: Head is normocephalic. Extraocular movements are intact. Oropharynx is clear. Neck: Neck is supple, no palpable cervical or supraclavicular lymphadenopathy. Heart: Regular in rate and rhythm with no murmurs, rubs, or gallops. Chest: Decreased breath sounds noted in the right lower lung field Abdomen: Soft, nontender, nondistended, with no rigidity or guarding. Extremities: No cyanosis or edema. Lymphatics: see Neck Exam Skin: No concerning lesions. Musculoskeletal: symmetric strength and muscle tone throughout. Neurologic: Cranial nerves II through XII are grossly intact. No obvious focalities. Speech is fluent. Coordination is intact. Psychiatric: Judgment and insight are intact. Affect is appropriate.     ECOG = 2  LABORATORY DATA:  Lab Results  Component Value Date   WBC  11.6 (H) 10/24/2016   HGB 10.5 (L) 10/24/2016   HCT 34.2 (L) 10/24/2016   MCV 86.4 10/24/2016   PLT 302 10/24/2016   NEUTROABS 9.6 (H) 10/24/2016   Lab Results  Component Value Date   NA 142 10/24/2016   K 3.3 (L) 10/24/2016   CL 101 10/17/2016   CO2 31 (H) 10/24/2016   GLUCOSE 137 10/24/2016   CREATININE 0.7 10/24/2016   CALCIUM 10.1 10/24/2016      RADIOGRAPHY: Dg Chest 1 View  Result Date: 10/01/2016 CLINICAL DATA:  Post right thoracentesis EXAM: CHEST 1 VIEW COMPARISON:  09/28/2016 FINDINGS: Decreasing right pleural effusion following thoracentesis. No pneumothorax. Small right effusion and right base atelectasis persists. Heart is borderline in size. Left lung is clear. IMPRESSION: No pneumothorax following right thoracentesis. Small residual right effusion with right base  atelectasis. Electronically Signed   By: Rolm Baptise M.D.   On: 10/01/2016 14:33   Dg Chest 2 View  Result Date: 10/16/2016 CLINICAL DATA:  Follow-up pleural effusion . EXAM: CHEST  2 VIEW COMPARISON:  CT 10/14/2016.  Chest x-ray 10/14/2016 09/25/2016. FINDINGS: Mediastinum and hilar structures are normal. Heart size normal. Right lower lobe atelectasis and infiltrate. Small right pleural effusion. No acute bony abnormality identified. IMPRESSION: Right lower lobe atelectasis and infiltrate again noted. With small right pleural effusion noted. Similar findings noted on prior exam. No pneumothorax. Electronically Signed   By: Marcello Moores  Register   On: 10/16/2016 10:32   Dg Chest 2 View  Result Date: 10/14/2016 CLINICAL DATA:  Left-sided breast pain and shortness of breath. History of lung cancer. EXAM: CHEST  2 VIEW COMPARISON:  Chest radiograph 10/07/2016 and chest CT 09/24/2016 FINDINGS: Cardiomediastinal contours are unchanged. Small right pleural effusion with loculated component along the right lateral hemithorax is slightly decreased in size. Multiple nodular opacities overlying the right lung are unchanged. There is atherosclerotic calcification within the aortic arch. No pneumothorax, focal airspace consolidation or pulmonary edema. IMPRESSION: 1. No acute cardiopulmonary disease. 2. Decreased size of partially loculated right pleural effusion and unchanged multifocal nodular opacities of the right hemithorax. 3. Aortic atherosclerosis. Electronically Signed   By: Ulyses Jarred M.D.   On: 10/14/2016 14:12   Dg Chest 2 View  Result Date: 10/07/2016 CLINICAL DATA:  Recurrent right pleural and fusion associated with shortness of breath and chest pain. History of COPD, former smoker, diabetes. Patient also has a history of right-sided breast malignancy. EXAM: CHEST  2 VIEW COMPARISON:  Chest x-rays of October 04, 2016, September 24, 2016, and CT scan of the chest of September 24, 2016. FINDINGS: The  left lung is mildly hyperinflated. There is no left-sided pleural effusion. On the right there is a small pleural effusion which has increased slightly in size over the past 3 days. There is no pneumothorax. There is stable ovoid density projecting over the posterior aspect of the right seventh rib and patchy interstitial density more inferiorly. Is calcification in the wall of the aortic arch. The heart and pulmonary vascularity are normal. The bony thorax exhibits no acute abnormality. The heart and pulmonary vascularity are normal. The mediastinum is normal in width. IMPRESSION: Slight interval increase in the volume of the small right pleural effusion. Stable underlying chronic bronchitic changes. Patchy density in the right mid lung is stable. Thoracic aortic atherosclerosis. Electronically Signed   By: David  Martinique M.D.   On: 10/07/2016 09:41   Dg Chest 2 View  Result Date: 10/04/2016 CLINICAL DATA:  Shortness of breath.  Ex-smoker.  EXAM: CHEST  2 VIEW COMPARISON:  10/01/2016. FINDINGS: Increasing RIGHT pleural effusion. Compressive atelectasis at the RIGHT base but no definite consolidation. Trace LEFT pleural effusion. No pneumothorax. Normal heart size. IMPRESSION: Beginning re-accumulation of RIGHT pleural effusion post thoracentesis. Electronically Signed   By: Staci Righter M.D.   On: 10/04/2016 10:28   Ct Angio Chest Pe W Or Wo Contrast  Result Date: 10/14/2016 CLINICAL DATA:  Short of breath. Pleural drainage catheter removed 1 week prior. EXAM: CT ANGIOGRAPHY CHEST WITH CONTRAST TECHNIQUE: Multidetector CT imaging of the chest was performed using the standard protocol during bolus administration of intravenous contrast. Multiplanar CT image reconstructions and MIPs were obtained to evaluate the vascular anatomy. CONTRAST:  70 mL Isovue COMPARISON:  CT 09/24/2016 FINDINGS: Cardiovascular: Coronary artery calcification and aortic atherosclerotic calcification. No pericardial fluid.  Mediastinum/Nodes: No axillary or supraclavicular adenopathy. No mediastinal adenopathy. Lungs/Pleura: There are multiple small pockets of loculated pleural fluid along the posterior aspect of the RIGHT hemithorax. Interval reduction of moderate dependent effusion effusion seen on comparison exam. Some fluid extends along the fissure. No pneumothorax. Rounded nodule in the anterior RIGHT middle lobe measuring 9 mm (image 63, series 11. Nodule in the anterior RIGHT upper lobe additionally measuring 16 mm on image 44, series. These 2 pulmonary nodules are not changed comparison exam. LEFT lung clear Upper Abdomen: Limited view of the liver, kidneys, pancreas are unremarkable. Normal adrenal glands. Musculoskeletal: No aggressive osseous lesion. Review of the MIP images confirms the above findings. IMPRESSION: 1. Multiple small pockets of loculated pleural fluid within the RIGHT hemithorax replaces the previous seen moderate pleural effusion. 2. Stable pulmonary nodules the RIGHT upper lobe and RIGHT middle lobe of undetermined etiology. 3. No pneumothorax Electronically Signed   By: Suzy Bouchard M.D.   On: 10/14/2016 17:00   Mr Jeri Cos QP Contrast  Result Date: 10/04/2016 CLINICAL DATA:  New diagnosis of lung cancer. History of breast cancer, hypertension rheumatoid arthritis. Assess for intracranial metastasis. EXAM: MRI HEAD WITHOUT AND WITH CONTRAST TECHNIQUE: Multiplanar, multiecho pulse sequences of the brain and surrounding structures were obtained without and with intravenous contrast. CONTRAST:  44m MULTIHANCE GADOBENATE DIMEGLUMINE 529 MG/ML IV SOLN COMPARISON:  None. FINDINGS: Mild motion degraded examination. INTRACRANIAL CONTENTS: No reduced diffusion to suggest acute ischemia or hypercellular tumor. No susceptibility artifact to suggest hemorrhage. The ventricles and sulci are normal for patient's age. Patchy supratentorial white matter FLAIR T2 hyperintensities are less than expected for age,  compatible with chronic small vessel ischemic disease. No suspicious parenchymal signal, masses, mass effect. No abnormal intraparenchymal or extra-axial enhancement. No abnormal extra-axial fluid collections. No extra-axial masses. VASCULAR: Normal major intracranial vascular flow voids present at skull base. SKULL AND UPPER CERVICAL SPINE: No abnormal sellar expansion. No suspicious calvarial bone marrow signal. Craniocervical junction maintained. Small RIGHT exostosis. SINUSES/ORBITS: RIGHT maxillary mucosal retention cyst. Small RIGHT mastoid effusion.The included ocular globes and orbital contents are non-suspicious. OTHER: None. IMPRESSION: No intracranial metastasis. Negative motion degraded MRI head with and without contrast for age. Electronically Signed   By: CElon AlasM.D.   On: 10/04/2016 18:35   Ir Guided DNiel HummerW Catheter Placement  Result Date: 10/07/2016 CLINICAL DATA:  History of lung cancer, now with recurrent symptomatic right-sided malignant effusion. Please perform tunneled right-sided pleural drainage catheter for palliative purposes. EXAM: INSERTION OF TUNNELED RIGHT SIDED PLEURAL DRAINAGE CATHETER COMPARISON:  Ultrasound-guided thoracentesis - 10/01/2016; 09/25/2016; chest radiograph - 10/07/2016 MEDICATIONS: Ancef 2 gm IV; Antibiotic was administered in an appropriate  time interval for the procedure. ANESTHESIA/SEDATION: Moderate (conscious) sedation was employed during this procedure. A total of Versed 2 mg and Fentanyl 50 mcg was administered intravenously. Moderate Sedation Time: 14 minutes. The patient's level of consciousness and vital signs were monitored continuously by radiology nursing throughout the procedure under my direct supervision. FLUOROSCOPY TIME:  FLUOROSCOPY TIME 36 seconds (09.3 mGy) COMPLICATIONS: None immediate. PROCEDURE: The procedure, risks, benefits, and alternatives were explained to the patient, who wish to proceed with the placement of this permanent  pleural catheter as the patient is seeking palliative care. The patient understand and consent to the procedure. The right inferior lateral chest and upper abdomen were prepped with Chlorhexidine in a sterile fashion, and a sterile drape was applied covering the operative field. A sterile gown and sterile gloves were used for the procedure. Initial ultrasound scanning and fluoroscopic imaging demonstrates a recurrent moderate to large pleural effusion. Under direct ultrasound guidance, the right inferior lateral pleural space was accessed with a Yueh sheath needle after the overlying soft tissues were anesthetized with 1% lidocaine with epinephrine. An Amplatz super stiff wire was then advanced under fluoroscopy into the pleural space. A 15.5 French tunneled Pleur-X catheter was tunneled from an incision within the right upper abdominal quadrant to the access site. The pleural access site was serially dilated under fluoroscopy, ultimately allowing placement of a peel-away sheath. The catheter was advanced through the peel-away sheath. The sheath was then removed. Final catheter positioning was confirmed with a fluoroscopic radiographic image. The access incision was closed with subcutaneous subcuticular 4-0 Vicryl, Dermabond and Steri-Strips. A Prolene retention suture was applied at the catheter exit site. Large volume thoracentesis was performed through the new catheter utilizing provided bulb vacuum assisted drainage bag. The patient tolerated the above procedure well without immediate postprocedural complication. FINDINGS: Preprocedural ultrasound scanning demonstrates a recurrent moderate sized right sided pleural effusion. After ultrasound and fluoroscopic guided placement, the catheter is coiled within the caudal aspect of the right hemi thorax at the location of the patient's recurrent malignant effusion. Following catheter placement, approximately 1.3 L of serous though slightly blood tinged pleural fluid  was removed. IMPRESSION: Successful placement of permanent, tunneled right pleural drainage catheter via lateral approach. Approximately 1.3 L of serous though slightly blood tinged pleural fluid was removed after catheter placement. Electronically Signed   By: Sandi Mariscal M.D.   On: 10/07/2016 16:42   Nm Pet Image Initial (pi) Skull Base To Thigh  Result Date: 10/22/2016 CLINICAL DATA:  Initial treatment strategy for right lung adenocarcinoma. EXAM: NUCLEAR MEDICINE PET SKULL BASE TO THIGH TECHNIQUE: 11.4 mCi F-18 FDG was injected intravenously. Full-ring PET imaging was performed from the skull base to thigh after the radiotracer. CT data was obtained and used for attenuation correction and anatomic localization. FASTING BLOOD GLUCOSE:  Value: 160 mg/dl COMPARISON:  Chest CTA on 10/14/2016 FINDINGS: NECK No hypermetabolic lymph nodes in the neck. CHEST Small multiloculated right pleural effusion shows diffuse hypermetabolic activity, consistent with malignant pleural effusion. A spiculated nodule in the anterior right upper lobe abutting the pleural surface on image 81/8 is hypermetabolic, with SUV max of 12.2. A 10 mm smoothly marginated pulmonary nodule in the right middle lobe on image 48/7 shows mild hypermetabolism with SUV max of 4.4. No suspicious pulmonary nodule seen in the left lung. 8 mm subcarinal lymph node on image 29/9 is hypermetabolic with SUV max of 8.3. Hypermetabolic activity also seen in right hilum with SUV max of 12.2 ABDOMEN/PELVIS Multiple small  hypermetabolic lesions are seen throughout the right and left hepatic lobes, consistent with diffuse liver metastases. 1.8 cm left adrenal mass is hypermetabolic with SUV max of 42.5, consistent with left adrenal metastasis. No other hypermetabolic masses or lymphadenopathy identified within the abdomen or pelvis. Colonic diverticulosis is noted, without evidence of diverticulitis. Previous hysterectomy. SKELETON Diffuse hypermetabolic lytic  bone metastases are seen throughout the spine, bilateral ribs, bilateral scapulae and right humeral head, pelvis, and bilateral hips, consistent with diffuse bone metastases. Several pathologic right rib fractures are noted. IMPRESSION: 16 mm hypermetabolic spiculated nodule in the anterior right upper lobe, and 10 mm hypermetabolic smoothly marginated nodule in right middle lobe, which could represent primary bronchogenic carcinoma or pulmonary metastases. Small malignant multiloculated right pleural effusion. Small hypermetabolic right hilar and subcarinal mediastinal lymph nodes, consistent with metastatic disease. Diffuse liver metastases. Small left adrenal metastasis. Diffuse lytic bone metastases. Several pathologic right rib fractures also noted. Electronically Signed   By: Earle Gell M.D.   On: 10/22/2016 11:53   Dg Chest Port 1 View  Result Date: 09/28/2016 CLINICAL DATA:  Productive cough today EXAM: PORTABLE CHEST 1 VIEW COMPARISON:  09/25/2016 FINDINGS: There is new airspace opacity obscuring the right hemidiaphragm. Atherosclerotic calcification of the aortic arch. The left lung appears clear.  Upper normal heart size. IMPRESSION: 1. Obscuration of the right hemidiaphragm with some blunting of the right lateral costophrenic angle. This could reflect recurrent right pleural effusion with passive atelectasis although right basilar pneumonia is not readily excluded. 2. Atherosclerotic aortic arch. Electronically Signed   By: Van Clines M.D.   On: 09/28/2016 10:26   US Thyroid  Result Date: 09/27/2016 CLINICAL DATA:  Incidental on CT.  Thyroid nodule seen on CT. EXAM: THYROID ULTRASOUND TECHNIQUE: Ultrasound examination of the thyroid gland and adjacent soft tissues was performed. COMPARISON:  Chest CT 09/24/2016 FINDINGS: Parenchymal Echotexture: Mildly heterogenous Estimated total number of nodules >/= 1 cm: 3 Number of spongiform nodules >/=  2 cm not described below (TR1): 0 Number  of mixed cystic and solid nodules >/= 1.5 cm not described below (TR2): 0 _________________________________________________________ Isthmus: Measures 0.4 cm in thickness. No discrete nodules are identified within the thyroid isthmus. _________________________________________________________ Right lobe: Measures 4.2 x 1.6 x 2.0 cm. Nodule # 1: Location: Isthmus; Superior Size: 1.6 x 1.3 x 1.4 cm. Composition: solid/almost completely solid (2), there is an eccentric cystic component. Echogenicity: isoechoic (1) Shape: not taller-than-wide (0) Margins: ill-defined (0) Echogenic foci: none (0) ACR TI-RADS total points: 3. ACR TI-RADS risk category: TR3 (3 points). ACR TI-RADS recommendations: *Given size (>/= 1.5 - 2.4 cm) and appearance, a follow-up ultrasound in 1 year should be considered based on TI-RADS criteria. Nodule # 2: Location: Right; Inferior Size: 0.6 x 0.4 x 0.5 cm Composition: solid/almost completely solid (2) Echogenicity: hypoechoic (2) Shape: not taller-than-wide (0) Margins: ill-defined (0) Echogenic foci: none (0) ACR TI-RADS total points: 4. ACR TI-RADS risk category: TR4 (4-6 points). ACR TI-RADS recommendations: Given size (<0.9 cm) and appearance, this nodule does NOT meet TI-RADS criteria for biopsy or dedicated follow-up. _________________________________________________________ Left lobe: Measures 4.1 x 1.6 x 1.9 cm. Nodule # 3: Location: Left; Superior Size: 1.0 x 1.0 x 1.0 cm. Composition: mixed cystic and solid (1) Echogenicity: hypoechoic (2) Shape: not taller-than-wide (0) Margins: smooth (0) Echogenic foci: none (0) ACR TI-RADS total points: 3. ACR TI-RADS risk category: TR3 (3 points). ACR TI-RADS recommendations: Given size (<1.4 cm) and appearance, this nodule does NOT meet TI-RADS criteria for biopsy  or dedicated follow-up. There is a cystic structure in the superior left thyroid lobe measuring 1.0 x 0.7 x 0.7 cm. IMPRESSION: Solid and cystic nodules in the thyroid. Largest nodule  measures 1.6 cm in the superior right lobe. This nodule meets criteria for 1 year follow-up. The above is in keeping with the ACR TI-RADS recommendations - J Am Coll Radiol 2017;14:587-595. Electronically Signed   By: Markus Daft M.D.   On: 09/27/2016 07:59   Ir Removal Of Plural Cath W/cuff  Result Date: 10/10/2016 INDICATION: 70 year old female with a history of malignant right-sided pleural effusion and PleurX catheter placed 10/07/2016. She presents today for malfunctioning catheter. EXAM: IR REMOVAL OF PLEURAL CATH WITH CUFF MEDICATIONS: The patient is currently admitted to the hospital and receiving intravenous antibiotics. The antibiotics were administered within an appropriate time frame prior to the initiation of the procedure. ANESTHESIA/SEDATION: None COMPLICATIONS: None PROCEDURE: Informed written consent was obtained from the patient after a thorough discussion of the procedural risks, benefits and alternatives. All questions were addressed. Maximal Sterile Barrier Technique was utilized including caps, mask, sterile gowns, sterile gloves, sterile drape, hand hygiene and skin antiseptic. A timeout was performed prior to the initiation of the procedure. Patient was positioned supine position on the fluoroscopy table. 1% lidocaine was used for local anesthesia. Spot images of the chest were performed. Contrast injection was performed of the catheter. Glidewire was advanced through the catheter in attempt to reposition. Retention suture was ligated in order to Dermabond the skin. Catheter was damaged during the manipulation requiring attempt at exchange. Stiff wire was advanced through the catheter which was removed from the stiff wire. Combination of the peel-away sheath, Kumpe catheter were used in order to stiff in the tract an allow passage of the new catheter over the wiring catheter combination. Exchange of the catheter failed, with inability to passed through the soft tissue tract and enter the  pleura. Catheter was removed and dressing was placed. Patient tolerated the procedure well and remained hemodynamically stable throughout. No complications were encountered and no significant blood loss encountered. FINDINGS: Initial image demonstrates catheter coiled at the base of the lung, potentially subpulmonic. Contrast injection demonstrates contrast accumulating within the pleural is a space. Wire manipulation failed to manipulate the catheter to more superior position. Images demonstrate failed catheter exchange given the damaged catheter. IMPRESSION: Status post failed attempt at exchange of a tunneled right chest PleurX catheter. Signed, Dulcy Fanny. Earleen Newport, DO [facility] Radiology PLAN: The patient will return for a scheduled visit at the [facility] with the chest x-ray to determine need for new PleurX catheter placement versus potential repeat thoracentesis preceding her oncology plan. Electronically Signed   By: Corrie Mckusick D.O.   On: 10/10/2016 16:52   US Thoracentesis Asp Pleural Space W/img Guide  Result Date: 10/01/2016 INDICATION: Recurrent right pleural effusion of unknown etiology. Request for diagnostic and therapeutic thoracentesis. EXAM: ULTRASOUND GUIDED DIAGNOSTIC AND THERAPEUTIC THORACENTESIS MEDICATIONS: 1% Lidocaine COMPLICATIONS: None immediate. PROCEDURE: An ultrasound guided thoracentesis was thoroughly discussed with the patient and questions answered. The benefits, risks, alternatives and complications were also discussed. The patient understands and wishes to proceed with the procedure. Written consent was obtained. Ultrasound was performed to localize and mark an adequate pocket of fluid in the right chest. The area was then prepped and draped in the normal sterile fashion. 1% Lidocaine was used for local anesthesia. Under ultrasound guidance a Safe-T-Centesis catheter was introduced. Thoracentesis was performed. The catheter  was removed and a dressing applied. FINDINGS: A total of approximately 1.2L of serosanguineous fluid was removed. Samples were sent to the laboratory as requested by the clinical team. IMPRESSION: Successful ultrasound guided right thoracentesis yielding 1.2L of pleural fluid. Read by: Saverio Danker, PA-C Electronically Signed   By: Lucrezia Europe M.D.   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 I discussed course of treatment side effects and potential toxicities of radiation therapy in this situation with the patient and her sister area she appears to understand and wishes to proceed with planned course of treatment.  PLAN: Simulation and planning on December 27 with treatments to begin December 28. I anticipate approximately 10 treatments.     ------------------------------------------------  Blair Promise, PhD, MD

## 2016-10-28 ENCOUNTER — Emergency Department (HOSPITAL_COMMUNITY): Payer: Medicare Other

## 2016-10-28 ENCOUNTER — Inpatient Hospital Stay (HOSPITAL_COMMUNITY)
Admission: EM | Admit: 2016-10-28 | Discharge: 2016-12-05 | DRG: 871 | Disposition: E | Payer: Medicare Other | Attending: Internal Medicine | Admitting: Internal Medicine

## 2016-10-28 ENCOUNTER — Other Ambulatory Visit: Payer: Self-pay

## 2016-10-28 ENCOUNTER — Encounter (HOSPITAL_COMMUNITY): Payer: Self-pay | Admitting: Emergency Medicine

## 2016-10-28 DIAGNOSIS — R531 Weakness: Secondary | ICD-10-CM

## 2016-10-28 DIAGNOSIS — J910 Malignant pleural effusion: Secondary | ICD-10-CM | POA: Diagnosis present

## 2016-10-28 DIAGNOSIS — Z9221 Personal history of antineoplastic chemotherapy: Secondary | ICD-10-CM

## 2016-10-28 DIAGNOSIS — M069 Rheumatoid arthritis, unspecified: Secondary | ICD-10-CM | POA: Diagnosis present

## 2016-10-28 DIAGNOSIS — Z7189 Other specified counseling: Secondary | ICD-10-CM | POA: Diagnosis not present

## 2016-10-28 DIAGNOSIS — C7951 Secondary malignant neoplasm of bone: Secondary | ICD-10-CM | POA: Diagnosis present

## 2016-10-28 DIAGNOSIS — I10 Essential (primary) hypertension: Secondary | ICD-10-CM | POA: Diagnosis present

## 2016-10-28 DIAGNOSIS — R0682 Tachypnea, not elsewhere classified: Secondary | ICD-10-CM

## 2016-10-28 DIAGNOSIS — W19XXXA Unspecified fall, initial encounter: Secondary | ICD-10-CM

## 2016-10-28 DIAGNOSIS — I2781 Cor pulmonale (chronic): Secondary | ICD-10-CM | POA: Diagnosis not present

## 2016-10-28 DIAGNOSIS — Z6833 Body mass index (BMI) 33.0-33.9, adult: Secondary | ICD-10-CM

## 2016-10-28 DIAGNOSIS — M545 Low back pain: Secondary | ICD-10-CM

## 2016-10-28 DIAGNOSIS — J449 Chronic obstructive pulmonary disease, unspecified: Secondary | ICD-10-CM | POA: Diagnosis not present

## 2016-10-28 DIAGNOSIS — E43 Unspecified severe protein-calorie malnutrition: Secondary | ICD-10-CM | POA: Diagnosis present

## 2016-10-28 DIAGNOSIS — Z515 Encounter for palliative care: Secondary | ICD-10-CM | POA: Diagnosis not present

## 2016-10-28 DIAGNOSIS — E876 Hypokalemia: Secondary | ICD-10-CM

## 2016-10-28 DIAGNOSIS — R630 Anorexia: Secondary | ICD-10-CM | POA: Diagnosis present

## 2016-10-28 DIAGNOSIS — Z87891 Personal history of nicotine dependence: Secondary | ICD-10-CM

## 2016-10-28 DIAGNOSIS — I2699 Other pulmonary embolism without acute cor pulmonale: Secondary | ICD-10-CM | POA: Diagnosis present

## 2016-10-28 DIAGNOSIS — Z7951 Long term (current) use of inhaled steroids: Secondary | ICD-10-CM

## 2016-10-28 DIAGNOSIS — C3490 Malignant neoplasm of unspecified part of unspecified bronchus or lung: Secondary | ICD-10-CM

## 2016-10-28 DIAGNOSIS — Z9981 Dependence on supplemental oxygen: Secondary | ICD-10-CM

## 2016-10-28 DIAGNOSIS — Z7952 Long term (current) use of systemic steroids: Secondary | ICD-10-CM

## 2016-10-28 DIAGNOSIS — I110 Hypertensive heart disease with heart failure: Secondary | ICD-10-CM | POA: Diagnosis not present

## 2016-10-28 DIAGNOSIS — J9600 Acute respiratory failure, unspecified whether with hypoxia or hypercapnia: Secondary | ICD-10-CM

## 2016-10-28 DIAGNOSIS — A419 Sepsis, unspecified organism: Secondary | ICD-10-CM | POA: Diagnosis not present

## 2016-10-28 DIAGNOSIS — I248 Other forms of acute ischemic heart disease: Secondary | ICD-10-CM | POA: Diagnosis present

## 2016-10-28 DIAGNOSIS — C3491 Malignant neoplasm of unspecified part of right bronchus or lung: Secondary | ICD-10-CM | POA: Diagnosis present

## 2016-10-28 DIAGNOSIS — R509 Fever, unspecified: Secondary | ICD-10-CM

## 2016-10-28 DIAGNOSIS — C787 Secondary malignant neoplasm of liver and intrahepatic bile duct: Secondary | ICD-10-CM | POA: Diagnosis present

## 2016-10-28 DIAGNOSIS — I5031 Acute diastolic (congestive) heart failure: Secondary | ICD-10-CM | POA: Diagnosis not present

## 2016-10-28 DIAGNOSIS — C7972 Secondary malignant neoplasm of left adrenal gland: Secondary | ICD-10-CM | POA: Diagnosis present

## 2016-10-28 DIAGNOSIS — J9602 Acute respiratory failure with hypercapnia: Secondary | ICD-10-CM | POA: Diagnosis present

## 2016-10-28 DIAGNOSIS — F419 Anxiety disorder, unspecified: Secondary | ICD-10-CM | POA: Diagnosis not present

## 2016-10-28 DIAGNOSIS — Z853 Personal history of malignant neoplasm of breast: Secondary | ICD-10-CM

## 2016-10-28 DIAGNOSIS — Z66 Do not resuscitate: Secondary | ICD-10-CM | POA: Diagnosis not present

## 2016-10-28 DIAGNOSIS — J189 Pneumonia, unspecified organism: Secondary | ICD-10-CM | POA: Diagnosis present

## 2016-10-28 DIAGNOSIS — R0602 Shortness of breath: Secondary | ICD-10-CM

## 2016-10-28 DIAGNOSIS — Z79899 Other long term (current) drug therapy: Secondary | ICD-10-CM

## 2016-10-28 DIAGNOSIS — M549 Dorsalgia, unspecified: Secondary | ICD-10-CM | POA: Diagnosis present

## 2016-10-28 DIAGNOSIS — G4700 Insomnia, unspecified: Secondary | ICD-10-CM | POA: Diagnosis not present

## 2016-10-28 LAB — CBC
HEMATOCRIT: 31.4 % — AB (ref 36.0–46.0)
Hemoglobin: 9.5 g/dL — ABNORMAL LOW (ref 12.0–15.0)
MCH: 26.3 pg (ref 26.0–34.0)
MCHC: 30.3 g/dL (ref 30.0–36.0)
MCV: 87 fL (ref 78.0–100.0)
PLATELETS: 295 10*3/uL (ref 150–400)
RBC: 3.61 MIL/uL — ABNORMAL LOW (ref 3.87–5.11)
RDW: 17 % — AB (ref 11.5–15.5)
WBC: 14 10*3/uL — AB (ref 4.0–10.5)

## 2016-10-28 LAB — CBC WITH DIFFERENTIAL/PLATELET
BASOS PCT: 0 %
Basophils Absolute: 0 10*3/uL (ref 0.0–0.1)
EOS ABS: 0 10*3/uL (ref 0.0–0.7)
Eosinophils Relative: 0 %
HEMATOCRIT: 34.4 % — AB (ref 36.0–46.0)
Hemoglobin: 10.6 g/dL — ABNORMAL LOW (ref 12.0–15.0)
LYMPHS ABS: 2 10*3/uL (ref 0.7–4.0)
Lymphocytes Relative: 14 %
MCH: 26.5 pg (ref 26.0–34.0)
MCHC: 30.8 g/dL (ref 30.0–36.0)
MCV: 86 fL (ref 78.0–100.0)
MONO ABS: 0.8 10*3/uL (ref 0.1–1.0)
MONOS PCT: 6 %
Neutro Abs: 11.7 10*3/uL — ABNORMAL HIGH (ref 1.7–7.7)
Neutrophils Relative %: 80 %
Platelets: 302 10*3/uL (ref 150–400)
RBC: 4 MIL/uL (ref 3.87–5.11)
RDW: 16.7 % — AB (ref 11.5–15.5)
WBC: 14.6 10*3/uL — ABNORMAL HIGH (ref 4.0–10.5)

## 2016-10-28 LAB — URINALYSIS, ROUTINE W REFLEX MICROSCOPIC
BACTERIA UA: NONE SEEN
Bilirubin Urine: NEGATIVE
Glucose, UA: NEGATIVE mg/dL
Hgb urine dipstick: NEGATIVE
KETONES UR: 20 mg/dL — AB
Leukocytes, UA: NEGATIVE
Nitrite: NEGATIVE
PROTEIN: 30 mg/dL — AB
Specific Gravity, Urine: 1.021 (ref 1.005–1.030)
pH: 5 (ref 5.0–8.0)

## 2016-10-28 LAB — COMPREHENSIVE METABOLIC PANEL
ALBUMIN: 2.5 g/dL — AB (ref 3.5–5.0)
ALT: 25 U/L (ref 14–54)
AST: 41 U/L (ref 15–41)
Alkaline Phosphatase: 108 U/L (ref 38–126)
Anion gap: 13 (ref 5–15)
BILIRUBIN TOTAL: 1.1 mg/dL (ref 0.3–1.2)
BUN: 27 mg/dL — AB (ref 6–20)
CHLORIDE: 96 mmol/L — AB (ref 101–111)
CO2: 32 mmol/L (ref 22–32)
CREATININE: 0.68 mg/dL (ref 0.44–1.00)
Calcium: 9.9 mg/dL (ref 8.9–10.3)
GFR calc non Af Amer: >60 mL/min (ref >60)
Glucose, Bld: 99 mg/dL (ref 65–99)
Potassium: 3.2 mmol/L — ABNORMAL LOW (ref 3.5–5.1)
SODIUM: 141 mmol/L (ref 135–145)
Total Protein: 6.7 g/dL (ref 6.5–8.1)

## 2016-10-28 LAB — TROPONIN I
Troponin I: 0.03 ng/mL (ref <0.03)
Troponin I: 0.03 ng/mL (ref <0.03)
Troponin I: 0.03 ng/mL (ref <0.03)

## 2016-10-28 LAB — CREATININE, SERUM
Creatinine, Ser: 0.64 mg/dL (ref 0.44–1.00)
GFR calc Af Amer: >60 mL/min (ref >60)
GFR calc non Af Amer: >60 mL/min (ref >60)

## 2016-10-28 LAB — LACTIC ACID, PLASMA
LACTIC ACID, VENOUS: 1.4 mmol/L (ref 0.5–1.9)
LACTIC ACID, VENOUS: 1.1 mmol/L (ref 0.5–1.9)

## 2016-10-28 MED ORDER — FUROSEMIDE 40 MG PO TABS
40.0000 mg | ORAL_TABLET | Freq: Every day | ORAL | Status: DC
  Filled 2016-10-28: qty 1

## 2016-10-28 MED ORDER — PREGABALIN 75 MG PO CAPS
200.0000 mg | ORAL_CAPSULE | Freq: Every day | ORAL | Status: DC
  Administered 2016-10-28: 20:00:00 200 mg via ORAL
  Filled 2016-10-28: qty 1

## 2016-10-28 MED ORDER — MORPHINE SULFATE ER 30 MG PO TBCR
30.0000 mg | EXTENDED_RELEASE_TABLET | Freq: Two times a day (BID) | ORAL | Status: DC
  Administered 2016-10-28: 30 mg via ORAL
  Filled 2016-10-28: qty 1

## 2016-10-28 MED ORDER — ATORVASTATIN CALCIUM 40 MG PO TABS
40.0000 mg | ORAL_TABLET | Freq: Every evening | ORAL | Status: DC
  Administered 2016-10-28 – 2016-11-04 (×8): 40 mg via ORAL
  Filled 2016-10-28 (×8): qty 1

## 2016-10-28 MED ORDER — OXYCODONE-ACETAMINOPHEN 10-325 MG PO TABS: 1.0000 | ORAL_TABLET | Freq: Four times a day (QID) | ORAL | Status: DC | PRN

## 2016-10-28 MED ORDER — ENSURE ENLIVE PO LIQD: 237.0000 mL | Freq: Two times a day (BID) | ORAL | Status: DC

## 2016-10-28 MED ORDER — GUAIFENESIN ER 600 MG PO TB12
600.0000 mg | ORAL_TABLET | Freq: Two times a day (BID) | ORAL | Status: DC
  Administered 2016-10-28 – 2016-11-02 (×9): 600 mg via ORAL
  Filled 2016-10-28 (×13): qty 1

## 2016-10-28 MED ORDER — SODIUM CHLORIDE 0.9 % IV BOLUS (SEPSIS)
2000.0000 mL | Freq: Once | INTRAVENOUS | Status: AC
  Administered 2016-10-28: 2000 mL via INTRAVENOUS

## 2016-10-28 MED ORDER — LIDOCAINE-PRILOCAINE 2.5-2.5 % EX CREA
1.0000 "application " | TOPICAL_CREAM | Freq: Every day | CUTANEOUS | Status: DC | PRN
  Filled 2016-10-28: qty 5

## 2016-10-28 MED ORDER — HYDROMORPHONE HCL 1 MG/ML IJ SOLN
0.5000 mg | INTRAMUSCULAR | Status: DC | PRN
  Administered 2016-10-31 – 2016-11-03 (×5): 0.5 mg via INTRAVENOUS
  Filled 2016-10-28 (×6): qty 0.5

## 2016-10-28 MED ORDER — POTASSIUM CHLORIDE CRYS ER 20 MEQ PO TBCR
40.0000 meq | EXTENDED_RELEASE_TABLET | Freq: Once | ORAL | Status: AC
  Administered 2016-10-28: 40 meq via ORAL
  Filled 2016-10-28: qty 2

## 2016-10-28 MED ORDER — OXYCODONE HCL 5 MG PO TABS
5.0000 mg | ORAL_TABLET | Freq: Four times a day (QID) | ORAL | Status: DC | PRN
  Administered 2016-10-31 – 2016-11-01 (×2): 5 mg via ORAL
  Filled 2016-10-28 (×2): qty 1

## 2016-10-28 MED ORDER — IPRATROPIUM-ALBUTEROL 0.5-2.5 (3) MG/3ML IN SOLN: 3.0000 mL | RESPIRATORY_TRACT | Status: DC | PRN

## 2016-10-28 MED ORDER — SODIUM CHLORIDE 0.9 % IV SOLN
INTRAVENOUS | Status: DC
  Administered 2016-10-28 – 2016-10-29 (×3): via INTRAVENOUS
  Administered 2016-10-30: 125 mL/h via INTRAVENOUS
  Administered 2016-10-30 – 2016-11-01 (×4): via INTRAVENOUS

## 2016-10-28 MED ORDER — BISOPROLOL FUMARATE 5 MG PO TABS
10.0000 mg | ORAL_TABLET | Freq: Every day | ORAL | Status: DC
  Administered 2016-10-28: 10 mg via ORAL
  Filled 2016-10-28 (×2): qty 2

## 2016-10-28 MED ORDER — MOMETASONE FURO-FORMOTEROL FUM 200-5 MCG/ACT IN AERO
2.0000 | INHALATION_SPRAY | Freq: Two times a day (BID) | RESPIRATORY_TRACT | Status: DC
  Administered 2016-10-28: 2 via RESPIRATORY_TRACT
  Filled 2016-10-28: qty 8.8

## 2016-10-28 MED ORDER — ALBUTEROL SULFATE (2.5 MG/3ML) 0.083% IN NEBU
2.5000 mg | INHALATION_SOLUTION | Freq: Four times a day (QID) | RESPIRATORY_TRACT | Status: DC | PRN
Start: 2016-10-28 — End: 2016-11-01
  Administered 2016-10-30: 2.5 mg via RESPIRATORY_TRACT
  Filled 2016-10-28: qty 3

## 2016-10-28 MED ORDER — ENOXAPARIN SODIUM 40 MG/0.4ML ~~LOC~~ SOLN
40.0000 mg | SUBCUTANEOUS | Status: DC
  Administered 2016-10-28: 40 mg via SUBCUTANEOUS
  Filled 2016-10-28: qty 0.4

## 2016-10-28 MED ORDER — PREDNISONE 10 MG PO TABS
10.0000 mg | ORAL_TABLET | Freq: Every day | ORAL | Status: DC
  Administered 2016-10-29: 10 mg via ORAL
  Filled 2016-10-28: qty 2

## 2016-10-28 MED ORDER — MORPHINE SULFATE (PF) 2 MG/ML IV SOLN
2.0000 mg | INTRAVENOUS | Status: DC | PRN
  Administered 2016-10-29 – 2016-11-05 (×13): 2 mg via INTRAVENOUS
  Filled 2016-10-28 (×13): qty 1

## 2016-10-28 MED ORDER — OXYCODONE-ACETAMINOPHEN 5-325 MG PO TABS
1.0000 | ORAL_TABLET | Freq: Four times a day (QID) | ORAL | Status: DC | PRN
  Administered 2016-10-28: 1 via ORAL
  Filled 2016-10-28: qty 1

## 2016-10-28 NOTE — ED Provider Notes (Signed)
[facility] DEPT Provider Note   CSN: [id number] Arrival date & time: 10/12/2016  1359     History   Chief Complaint Chief Complaint  Patient presents with  . Weakness    HPI [name] is a 70 y.o. female.  70 year old female presents with diffuse whole-body weakness times several days due to decreased oral intake. Recently diagnosed with stage IV lung cancer with metastases to bone and has had increasing anorexia as well as some dyspnea. She is chronically on home oxygen. She is scheduled to start chemotherapy tomorrow. Denies any fever or chills. No vomiting or black or bloody stools. No abdominal chest discomfort. Weakness is worse with any activity. Called EMS and was transported here.      Past Medical History:  Diagnosis Date  . Back pain 10/24/2016  . Cancer [facility])    right breast cancer  . COPD (chronic obstructive pulmonary disease) ([place])   . Encounter for antineoplastic chemotherapy 10/24/2016  . Goals of care, counseling/discussion 10/24/2016  . Oxygen dependent     Patient Active Problem List   Diagnosis Date Noted  . Encounter for antineoplastic chemotherapy 10/24/2016  . Goals of care, counseling/discussion 10/24/2016  . Back pain 10/24/2016  . COPD exacerbation ([place]) 10/14/2016  . Pleural effusion 10/14/2016  . Malignant pleural effusion 10/08/2016  . Adenocarcinoma of lung, right ([place]) 10/08/2016  . Essential hypertension 10/02/2016  . Acute on chronic respiratory failure with hypoxia ([place]) 10/02/2016  . Recurrent right pleural effusion 10/02/2016  . Leukocytosis 10/02/2016  . Hypokalemia 10/02/2016  . Right lower lobe pneumonia ([place]) 10/02/2016  . Thyroid nodule 10/02/2016  . Adenocarcinoma of right lung, stage 4 ([place]) 10/02/2016  . Chest pain in adult   . Exudative pleural effusion   . Lung nodule seen on imaging study 09/26/2016  . COPD GOLD II 12/03/2013    Past Surgical History:  Procedure Laterality Date  . ABDOMINAL HYSTERECTOMY   1990  . BACK SURGERY    . BREAST SURGERY Right    lumpectomy w/ radiation  . IR GENERIC HISTORICAL  10/07/2016   IR GUIDED DRAIN W CATHETER PLACEMENT 10/07/2016 Sandi Mariscal, MD [facility]-INTERV RAD  . IR GENERIC HISTORICAL  10/10/2016   IR REMOVAL OF PLURAL CATH W/CUFF 10/10/2016 Corrie Mckusick, DO [facility]-INTERV RAD    OB History    No data available       Home Medications    Prior to Admission medications   Medication Sig Start Date End Date Taking? Authorizing Provider  albuterol (PROVENTIL HFA;VENTOLIN HFA) 108 (90 BASE) MCG/ACT inhaler Inhale 2 puffs into the lungs every 6 (six) hours as needed for wheezing or shortness of breath.     Historical Provider, MD  atorvastatin (LIPITOR) 40 MG tablet Take 40 mg by mouth daily.    Historical Provider, MD  bisoprolol (ZEBETA) 10 MG tablet Take 1 tablet (10 mg total) by mouth daily. 10/18/16   Nishant Dhungel, MD  dexamethasone (DECADRON) 4 MG tablet Take 1 tablet (4 mg total) by mouth 2 (two) times daily with a meal. Patient not taking: Reported on 10/25/2016 10/24/16   Curt Bears, MD  feeding supplement, ENSURE ENLIVE, (ENSURE ENLIVE) LIQD Take 237 mLs by mouth 2 (two) times daily between meals. Patient not taking: Reported on 10/25/2016 10/08/16   Flonnie Overman Dhungel, MD  folic acid (FOLVITE) 1 MG tablet Take 1 tablet (1 mg total) by mouth daily. 10/24/16   Curt Bears, MD  FOLIC ACID PO Take 1 mg by mouth daily.  Historical Provider, MD  furosemide (LASIX) 40 MG tablet Take 1 tablet (40 mg total) by mouth daily. 10/18/16   Nishant Dhungel, MD  guaiFENesin (MUCINEX) 600 MG 12 hr tablet Take 1 tablet (600 mg total) by mouth 2 (two) times daily. 10/17/16   Nishant Dhungel, MD  lidocaine-prilocaine (EMLA) cream Apply 1 application topically as needed. Patient not taking: Reported on 10/25/2016 10/24/16   Curt Bears, MD  methotrexate (RHEUMATREX) 2.5 MG tablet Take 15 mg by mouth once a week. Every Tuesday. 08/27/16   Historical Provider, MD    morphine (MS CONTIN) 30 MG 12 hr tablet Take 1 tablet (30 mg total) by mouth every 12 (twelve) hours. 10/24/16   Curt Bears, MD  oxyCODONE-acetaminophen (PERCOCET) 10-325 MG per tablet Take 1 tablet by mouth every 6 (six) hours as needed for pain. Patient taking differently: Take 1 tablet by mouth 5 (five) times daily as needed for pain.  12/08/13   Theodis Blaze, MD  predniSONE (DELTASONE) 5 MG tablet Take 10 mg by mouth every morning. 08/30/16   Historical Provider, MD  pregabalin (LYRICA) 200 MG capsule Take 200 mg by mouth daily.     Historical Provider, MD  prochlorperazine (COMPAZINE) 10 MG tablet Take 1 tablet (10 mg total) by mouth every 6 (six) hours as needed for nausea or vomiting. 10/24/16   Curt Bears, MD  SYMBICORT 160-4.5 MCG/ACT inhaler USE 2 PUFFS FIRST THING IN THE MORNING AND 2 PUFFS ABOUT 12 HOURS LATER 05/22/15   Tanda Rockers, MD    Family History Family History  Problem Relation Age of Onset  . Breast cancer Mother   . AAA (abdominal aortic aneurysm) Father   . Lung cancer Sister   . Cancer Other   . Diabetes Neg Hx   . CVA Neg Hx   . CAD Neg Hx     Social History Social History  Substance Use Topics  . Smoking status: Former Smoker    Packs/day: 3.00    Years: 39.00    Types: Cigarettes    Quit date: 12/03/2013  . Smokeless tobacco: Never Used     Comment: Using e-cig frequently 04/15/14  . Alcohol use No     Allergies   Patient has no known allergies.   Review of Systems Review of Systems  All other systems reviewed and are negative.    Physical Exam Updated Vital Signs BP 147/85   Pulse 112   Temp 98 F (36.7 C) (Oral)   Resp 15   SpO2 91%   Physical Exam  Constitutional: She is oriented to person, place, and time. She appears well-developed and well-nourished.  Non-toxic appearance. No distress.  HENT:  Head: Normocephalic and atraumatic.  Eyes: Conjunctivae, EOM and lids are normal. Pupils are equal, round, and reactive to  light.  Neck: Normal range of motion. Neck supple. No tracheal deviation present. No thyroid mass present.  Cardiovascular: Regular rhythm and normal heart sounds.  Tachycardia present.  Exam reveals no gallop.   No murmur heard. Pulmonary/Chest: Effort normal and breath sounds normal. No stridor. No respiratory distress. She has no decreased breath sounds. She has no wheezes. She has no rhonchi. She has no rales.  Abdominal: Soft. Normal appearance and bowel sounds are normal. She exhibits no distension. There is no tenderness. There is no rebound and no CVA tenderness.  Musculoskeletal: Normal range of motion. She exhibits no edema or tenderness.  Neurological: She is alert and oriented to person, place, and time. She  has normal strength. No cranial nerve deficit or sensory deficit. GCS eye subscore is 4. GCS verbal subscore is 5. GCS motor subscore is 6.  Skin: Skin is warm and dry. No abrasion and no rash noted. There is pallor.  Psychiatric: She has a normal mood and affect. Her speech is normal and behavior is normal.  Nursing note and vitals reviewed.    ED Treatments / Results  Labs (all labs ordered are listed, but only abnormal results are displayed) Labs Reviewed  URINE CULTURE  CBC WITH DIFFERENTIAL/PLATELET  COMPREHENSIVE METABOLIC PANEL  URINALYSIS, ROUTINE W REFLEX MICROSCOPIC  TROPONIN I    EKG  EKG Interpretation None       Radiology No results found.  Procedures Procedures (including critical care time)  Medications Ordered in ED Medications  0.9 %  sodium chloride infusion (not administered)  sodium chloride 0.9 % bolus 2,000 mL (not administered)     Initial Impression / Assessment and Plan / ED Course  I have reviewed the triage vital signs and the nursing notes.  Pertinent labs & imaging results that were available during my care of the patient were reviewed by me and considered in my medical decision making (see chart for details).  Clinical  Course    ED ECG REPORT   Date: 10/23/2016  Rate: 108  Rhythm: sinus tachycardia  QRS Axis: normal  Intervals: QT prolonged  ST/T Wave abnormalities: nonspecific ST changes  Conduction Disutrbances:none  Narrative Interpretation:   Old EKG Reviewed: none available  I have personally reviewed the EKG tracing and agree with the computerized printout as noted.   Final Clinical Impressions(s) / ED Diagnoses   Final diagnoses:  SOB (shortness of breath)   Patient given IV fluids remains tachycardic. Troponin elevated noted and likely from demand ischemia. Will be admitted to the hospitalist service New Prescriptions New Prescriptions   No medications on file     Lacretia Leigh, MD 10/29/2016 [phone]

## 2016-10-28 NOTE — ED Triage Notes (Signed)
Per [facility] pt from home, per family pt had declined over the 5 past days, gl weakness. Decreased appetite. Alert and oriented x 4. Hx lung and bone Cancer. Scheduled to start first chemo for tomorrow. No coughing nor shortness of breath .

## 2016-10-28 NOTE — ED Notes (Signed)
Pt made aware urine needed for UA. Unable to provide at this time. Will check back.

## 2016-10-28 NOTE — ED Notes (Signed)
Attempted to call report to [facility]W. Nurse unable to receive at this time-will call back.

## 2016-10-28 NOTE — H&P (Signed)
History and Physical    [name] POE:[id number] DOB: 11-22-1945 DOA: 10/06/2016  PCP: Donna Seashore, MD  Patient coming from: Home  Chief Complaint: Weakness  HPI: [name] is a 70 y.o. 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  Patient states that she fell on the way to the bathroom.  Patients roommate and her sisters daughter attempted to get her up but were unsuccessful.  Patient's sister is bedside and states that patient has not eaten more than a few bites of a banana in the past week and is so weak that she falls because she cannot use her legs.  Patient's sister states that patient is in significant amount of pain and it is unbearable.  Patient reports pain in her lower left back.  Patient does have metastases in her hip and pelvis.  Patient can no longer even ambulate with walker.  Sister states in her current state patient absolutely cannot go home due to her weakness and pain.  Patient sister states patient possibly has appointment for [facility] placement this upcoming Thursday, December 28.   ED Course: Patient evaluated by ED physician.  Potassium slightly low at 3.2, Troponin slightly elevated, WBC increased at 14.6.  Patient is tachypneic, tachycardic, afebrile, lactic acid was not ordered.  TRH was asked to admit for dyspnea, and generalized weakness.  Review of Systems: As per HPI otherwise 10 point review of systems negative.    Past Medical History:  Diagnosis Date  . Back pain 10/24/2016  . Cancer [facility])    right breast cancer  . COPD (chronic obstructive pulmonary disease) ([place])   . Encounter for antineoplastic chemotherapy 10/24/2016  . Goals of care, counseling/discussion 10/24/2016  . Oxygen dependent     Past Surgical History:  Procedure Laterality Date  . ABDOMINAL  HYSTERECTOMY  1990  . BACK SURGERY    . BREAST SURGERY Right    lumpectomy w/ radiation  . IR GENERIC HISTORICAL  10/07/2016   IR GUIDED DRAIN W CATHETER PLACEMENT 10/07/2016 Donna Mariscal, MD [facility]-INTERV RAD  . IR GENERIC HISTORICAL  10/10/2016   IR REMOVAL OF PLURAL CATH W/CUFF 10/10/2016 Donna Mckusick, DO [facility]-INTERV RAD     reports that she quit smoking about 2 years ago. Her smoking use included Cigarettes. She has a 117.00 pack-year smoking history. She has never used smokeless tobacco. She reports that she does not drink alcohol or use drugs.  No Known Allergies  Family History  Problem Relation Age of Onset  . Breast cancer Mother   . AAA (abdominal aortic aneurysm) Father   . Lung cancer Sister   . Cancer Other   . Diabetes Neg Hx   . CVA Neg Hx   . CAD Neg Hx     Prior to Admission medications   Medication Sig Start Date End Date Taking? Authorizing Provider  albuterol (PROVENTIL HFA;VENTOLIN HFA) 108 (90 BASE) MCG/ACT inhaler Inhale 2 puffs into the lungs every 6 (six) hours as needed for wheezing or shortness of breath.    Yes Historical Provider, MD  atorvastatin (LIPITOR) 40 MG tablet Take 40 mg by mouth every evening.    Yes Historical Provider, MD  bisoprolol (ZEBETA) 10 MG tablet Take 1 tablet (10 mg total) by mouth daily. 10/18/16  Yes Donna Dhungel, MD  folic acid (FOLVITE) 1 MG tablet Take  1 tablet (1 mg total) by mouth daily. 10/24/16  Yes Donna Bears, MD  furosemide (LASIX) 40 MG tablet Take 1 tablet (40 mg total) by mouth daily. 10/18/16  Yes Donna Dhungel, MD  guaiFENesin (MUCINEX) 600 MG 12 hr tablet Take 1 tablet (600 mg total) by mouth 2 (two) times daily. 10/17/16  Yes Donna Dhungel, MD  lidocaine-prilocaine (EMLA) cream Apply 1 application topically as needed. Patient taking differently: Apply 1 application topically daily as needed (port access).  10/24/16  Yes Donna Bears, MD  morphine (MS CONTIN) 30 MG 12 hr tablet Take 1 tablet (30 mg total) by  mouth every 12 (twelve) hours. 10/24/16  Yes Donna Bears, MD  oxyCODONE-acetaminophen (PERCOCET) 10-325 MG per tablet Take 1 tablet by mouth every 6 (six) hours as needed for pain. Patient taking differently: Take 1 tablet by mouth 5 (five) times daily as needed for pain.  12/08/13  Yes Donna Blaze, MD  predniSONE (DELTASONE) 5 MG tablet Take 10 mg by mouth every morning. 08/30/16  Yes Historical Provider, MD  pregabalin (LYRICA) 200 MG capsule Take 200 mg by mouth daily.    Yes Historical Provider, MD  SYMBICORT 160-4.5 MCG/ACT inhaler USE 2 PUFFS FIRST THING IN THE MORNING AND 2 PUFFS ABOUT 12 HOURS LATER 05/22/15  Yes Donna Rockers, MD  dexamethasone (DECADRON) 4 MG tablet Take 1 tablet (4 mg total) by mouth 2 (two) times daily with a meal. Patient not taking: Reported on 10/23/2016 10/24/16   Donna Bears, MD  feeding supplement, ENSURE ENLIVE, (ENSURE ENLIVE) LIQD Take 237 mLs by mouth 2 (two) times daily between meals. Patient not taking: Reported on 10/25/2016 10/08/16   Donna Overman Dhungel, MD  prochlorperazine (COMPAZINE) 10 MG tablet Take 1 tablet (10 mg total) by mouth every 6 (six) hours as needed for nausea or vomiting. Patient not taking: Reported on 10/14/2016 10/24/16   Donna Bears, MD    Physical Exam: Vitals:   10/06/2016 1404 11/01/2016 1407 10/11/2016 1629  BP:  147/85 159/69  Pulse:  112 115  Resp:  15 (!) 37  Temp:  98 F (36.7 C)   TempSrc:  Oral   SpO2: 96% 91% 90%      Constitutional: NAD, calm, comfortable Vitals:   10/29/2016 1404 11/03/2016 1407 10/22/2016 1629  BP:  147/85 159/69  Pulse:  112 115  Resp:  15 (!) 37  Temp:  98 F (36.7 C)   TempSrc:  Oral   SpO2: 96% 91% 90%   Eyes: PERRL, lids and conjunctivae normal ENMT: Mucous membranes are moist. Posterior pharynx clear of any exudate or lesions.Normal dentition.  Neck: normal, supple, no masses, no thyromegaly Respiratory: pursed lip breathing, expiratory wheezing in most lung fields, on 2.5L [place].    Cardiovascular:  Tachycardic rate, regular rhythm, no murmurs / rubs / gallops. No extremity edema. 2+ pedal pulses. No carotid bruits.  Abdomen: no tenderness, no masses palpated. No hepatosplenomegaly. Bowel sounds positive.  Musculoskeletal: no clubbing / cyanosis. No joint deformity upper and lower extremities. Good ROM, no contractures. Normal muscle tone.  Skin: no rashes, ulcers.  Lesion on posterior left elbow Neurologic: CN 2-12 grossly intact. Sensation intact, DTR normal. Strength 5/5 in all 4.  Psychiatric: Questionable insight and recall. Alert and oriented x 3. Normal mood.     Labs on Admission: I have personally reviewed following labs and imaging studies  CBC:  Recent Labs Lab 10/24/16 1455 10/23/2016 1448  WBC 11.6* 14.6*  NEUTROABS 9.6* 11.7*  HGB  10.5* 10.6*  HCT 34.2* 34.4*  MCV 86.4 86.0  PLT 302 124   Basic Metabolic Panel:  Recent Labs Lab 10/24/16 1455 10/18/2016 1448  NA 142 141  K 3.3* 3.2*  CL  --  96*  CO2 31* 32  GLUCOSE 137 99  BUN 19.3 27*  CREATININE 0.7 0.68  CALCIUM 10.1 9.9   GFR: Estimated Creatinine Clearance: 77.6 mL/min (by C-G formula based on SCr of 0.68 mg/dL). Liver Function Tests:  Recent Labs Lab 10/24/16 1455 10/22/2016 1448  AST 33 41  ALT 25 25  ALKPHOS 120 108  BILITOT 0.73 1.1  PROT 7.2 6.7  ALBUMIN 2.4* 2.5*   No results for input(s): LIPASE, AMYLASE in the last 168 hours. No results for input(s): AMMONIA in the last 168 hours. Coagulation Profile: No results for input(s): INR, PROTIME in the last 168 hours. Cardiac Enzymes:  Recent Labs Lab 10/25/2016 1448  TROPONINI 0.03*   BNP (last 3 results) No results for input(s): PROBNP in the last 8760 hours. HbA1C: No results for input(s): HGBA1C in the last 72 hours. CBG:  Recent Labs Lab 10/22/16 0745  GLUCAP 160*   Lipid Profile: No results for input(s): CHOL, HDL, LDLCALC, TRIG, CHOLHDL, LDLDIRECT in the last 72 hours. Thyroid Function Tests: No  results for input(s): TSH, T4TOTAL, FREET4, T3FREE, THYROIDAB in the last 72 hours. Anemia Panel: No results for input(s): VITAMINB12, FOLATE, FERRITIN, TIBC, IRON, RETICCTPCT in the last 72 hours. Urine analysis:    Component Value Date/Time   COLORURINE AMBER (A) 10/23/2016 1629   APPEARANCEUR HAZY (A) 10/15/2016 1629   LABSPEC 1.021 10/15/2016 1629   PHURINE 5.0 10/14/2016 1629   GLUCOSEU NEGATIVE 10/24/2016 1629   HGBUR NEGATIVE 10/16/2016 1629   BILIRUBINUR NEGATIVE 11/02/2016 1629   KETONESUR 20 (A) 10/29/2016 1629   PROTEINUR 30 (A) 10/21/2016 1629   UROBILINOGEN 0.2 04/30/2011 1340   NITRITE NEGATIVE 10/12/2016 1629   LEUKOCYTESUR NEGATIVE 10/24/2016 1629   Sepsis Labs: !!!!!!!!!!!!!!!!!!!!!!!!!!!!!!!!!!!!!!!!!!!! '@LABRCNTIP'$ (procalcitonin:4,lacticidven:4) )No results found for this or any previous visit (from the past 240 hour(s)).   Radiological Exams on Admission: Dg Chest 2 View  Result Date: 10/29/2016 CLINICAL DATA:  Shortness of breath.  History COPD.  Former smoker. EXAM: CHEST  2 VIEW COMPARISON:  PET-CT 10/22/2016 FINDINGS: Cardiomediastinal silhouette is normal. Mediastinal contours appear intact. Calcific atherosclerotic disease of the aorta seen. There is no evidence of pneumothorax. Nodular densities overlying the right hemithorax likely represent loculated pleural effusion. Malignant appearing nodules demonstrated by recent PET-CT in the right upper and right middle lobe are poorly visualized. Mild interstitial thickening on the right. Osseous structures are without acute abnormality. Soft tissues are grossly normal. IMPRESSION: No significant change in findings suggestive of right lung malignancy with potential lymphangitic spread of disease. Electronically Signed   By: Fidela Salisbury M.D.   On: 11/02/2016 15:04    EKG: Independently reviewed. Sinus tachycardia.  Borderline elongated QT interval  Assessment/Plan Principal Problem:   Weakness Active  Problems:   COPD GOLD II   Essential hypertension   Hypokalemia   Goals of care, counseling/discussion   Back pain   Non-small cell carcinoma of lung (HCC)    Weakness - PT/OT consult - SW consult as patient will likely not be able to return back to previous apartment - progressively worsening  Anorexia - nutrition consult placed  Non- small cell carcinoma of the lung - supposed to go for radiation mapping tomorrow - patient postponed port placement due to weakness this past  week - per notes from Dr. Julien Nordmann of oncology chemotherapy is palliative only - patient sister says she was just diagnosed 5 weeks ago - palliative care consulted  Hypokalemia - given 15mq in ED - will repeat BMP in am - replace as necessary  Back Pain - secondary to metastases - patient states at home she is on MS Contin and Oxycodone - will add PRN morphine and diluadid for breakthru pain     DVT prophylaxis: lovenox Code Status:  Full code Family Communication: Patient's sister is bedside  Disposition Plan: may need placement in SNF Consults called: none Admission status:  Observation- Telemetry   ALoretha StaplerMD [facility] Pager [phone] If 7PM-7AM, please contact night-coverage [email or web address] Password [email or web address] 10/23/2016, 5:26 PM

## 2016-10-28 NOTE — ED Notes (Signed)
Bed: TM93 Expected date:  Expected time:  Means of arrival:  Comments: EMS-cancer/FTT

## 2016-10-29 ENCOUNTER — Encounter (HOSPITAL_COMMUNITY): Payer: Self-pay | Admitting: Radiology

## 2016-10-29 ENCOUNTER — Observation Stay (HOSPITAL_COMMUNITY): Payer: Medicare Other

## 2016-10-29 DIAGNOSIS — J910 Malignant pleural effusion: Secondary | ICD-10-CM | POA: Diagnosis present

## 2016-10-29 DIAGNOSIS — J449 Chronic obstructive pulmonary disease, unspecified: Secondary | ICD-10-CM | POA: Diagnosis present

## 2016-10-29 DIAGNOSIS — Z515 Encounter for palliative care: Secondary | ICD-10-CM | POA: Diagnosis not present

## 2016-10-29 DIAGNOSIS — E43 Unspecified severe protein-calorie malnutrition: Secondary | ICD-10-CM | POA: Diagnosis present

## 2016-10-29 DIAGNOSIS — J189 Pneumonia, unspecified organism: Secondary | ICD-10-CM | POA: Diagnosis present

## 2016-10-29 DIAGNOSIS — M5440 Lumbago with sciatica, unspecified side: Secondary | ICD-10-CM | POA: Diagnosis not present

## 2016-10-29 DIAGNOSIS — Z853 Personal history of malignant neoplasm of breast: Secondary | ICD-10-CM | POA: Diagnosis not present

## 2016-10-29 DIAGNOSIS — M069 Rheumatoid arthritis, unspecified: Secondary | ICD-10-CM | POA: Diagnosis present

## 2016-10-29 DIAGNOSIS — J9601 Acute respiratory failure with hypoxia: Secondary | ICD-10-CM | POA: Diagnosis not present

## 2016-10-29 DIAGNOSIS — R531 Weakness: Secondary | ICD-10-CM

## 2016-10-29 DIAGNOSIS — Z9981 Dependence on supplemental oxygen: Secondary | ICD-10-CM | POA: Diagnosis not present

## 2016-10-29 DIAGNOSIS — J9600 Acute respiratory failure, unspecified whether with hypoxia or hypercapnia: Secondary | ICD-10-CM

## 2016-10-29 DIAGNOSIS — R000 Tachycardia, unspecified: Secondary | ICD-10-CM | POA: Diagnosis not present

## 2016-10-29 DIAGNOSIS — E876 Hypokalemia: Secondary | ICD-10-CM | POA: Diagnosis present

## 2016-10-29 DIAGNOSIS — Z79899 Other long term (current) drug therapy: Secondary | ICD-10-CM | POA: Diagnosis not present

## 2016-10-29 DIAGNOSIS — I2699 Other pulmonary embolism without acute cor pulmonale: Secondary | ICD-10-CM | POA: Diagnosis present

## 2016-10-29 DIAGNOSIS — Z7951 Long term (current) use of inhaled steroids: Secondary | ICD-10-CM | POA: Diagnosis not present

## 2016-10-29 DIAGNOSIS — C3431 Malignant neoplasm of lower lobe, right bronchus or lung: Secondary | ICD-10-CM | POA: Diagnosis not present

## 2016-10-29 DIAGNOSIS — I5031 Acute diastolic (congestive) heart failure: Secondary | ICD-10-CM | POA: Diagnosis not present

## 2016-10-29 DIAGNOSIS — Z7189 Other specified counseling: Secondary | ICD-10-CM | POA: Diagnosis not present

## 2016-10-29 DIAGNOSIS — R630 Anorexia: Secondary | ICD-10-CM | POA: Diagnosis present

## 2016-10-29 DIAGNOSIS — C7951 Secondary malignant neoplasm of bone: Secondary | ICD-10-CM | POA: Diagnosis present

## 2016-10-29 DIAGNOSIS — A419 Sepsis, unspecified organism: Secondary | ICD-10-CM | POA: Diagnosis present

## 2016-10-29 DIAGNOSIS — C3491 Malignant neoplasm of unspecified part of right bronchus or lung: Secondary | ICD-10-CM | POA: Diagnosis present

## 2016-10-29 DIAGNOSIS — I248 Other forms of acute ischemic heart disease: Secondary | ICD-10-CM | POA: Diagnosis present

## 2016-10-29 DIAGNOSIS — I10 Essential (primary) hypertension: Secondary | ICD-10-CM | POA: Diagnosis not present

## 2016-10-29 DIAGNOSIS — Z7952 Long term (current) use of systemic steroids: Secondary | ICD-10-CM | POA: Diagnosis not present

## 2016-10-29 DIAGNOSIS — Z87891 Personal history of nicotine dependence: Secondary | ICD-10-CM | POA: Diagnosis not present

## 2016-10-29 DIAGNOSIS — J9602 Acute respiratory failure with hypercapnia: Secondary | ICD-10-CM | POA: Diagnosis present

## 2016-10-29 DIAGNOSIS — C3490 Malignant neoplasm of unspecified part of unspecified bronchus or lung: Secondary | ICD-10-CM | POA: Diagnosis not present

## 2016-10-29 DIAGNOSIS — C7972 Secondary malignant neoplasm of left adrenal gland: Secondary | ICD-10-CM | POA: Diagnosis present

## 2016-10-29 DIAGNOSIS — C787 Secondary malignant neoplasm of liver and intrahepatic bile duct: Secondary | ICD-10-CM | POA: Diagnosis present

## 2016-10-29 DIAGNOSIS — Z9221 Personal history of antineoplastic chemotherapy: Secondary | ICD-10-CM | POA: Diagnosis not present

## 2016-10-29 LAB — BASIC METABOLIC PANEL
ANION GAP: 9 (ref 5–15)
BUN: 22 mg/dL — AB (ref 6–20)
CALCIUM: 9.4 mg/dL (ref 8.9–10.3)
CO2: 31 mmol/L (ref 22–32)
CREATININE: 0.85 mg/dL (ref 0.44–1.00)
Chloride: 103 mmol/L (ref 101–111)
GFR calc Af Amer: >60 mL/min (ref >60)
GLUCOSE: 89 mg/dL (ref 65–99)
Potassium: 3.9 mmol/L (ref 3.5–5.1)
Sodium: 143 mmol/L (ref 135–145)

## 2016-10-29 LAB — CBC
HEMATOCRIT: 32 % — AB (ref 36.0–46.0)
HEMOGLOBIN: 9.5 g/dL — AB (ref 12.0–15.0)
MCH: 26.6 pg (ref 26.0–34.0)
MCHC: 29.7 g/dL — AB (ref 30.0–36.0)
MCV: 89.6 fL (ref 78.0–100.0)
Platelets: 298 10*3/uL (ref 150–400)
RBC: 3.57 MIL/uL — ABNORMAL LOW (ref 3.87–5.11)
RDW: 17.5 % — AB (ref 11.5–15.5)
WBC: 11.2 10*3/uL — ABNORMAL HIGH (ref 4.0–10.5)

## 2016-10-29 LAB — URINALYSIS, COMPLETE (UACMP) WITH MICROSCOPIC
BILIRUBIN URINE: NEGATIVE
Glucose, UA: NEGATIVE mg/dL
Hgb urine dipstick: NEGATIVE
KETONES UR: NEGATIVE mg/dL
Nitrite: NEGATIVE
PH: 5 (ref 5.0–8.0)
PROTEIN: 30 mg/dL — AB
Specific Gravity, Urine: 1.021 (ref 1.005–1.030)

## 2016-10-29 LAB — COMPREHENSIVE METABOLIC PANEL
ALK PHOS: 94 U/L (ref 38–126)
ALT: 23 U/L (ref 14–54)
ANION GAP: 8 (ref 5–15)
AST: 40 U/L (ref 15–41)
Albumin: 2.2 g/dL — ABNORMAL LOW (ref 3.5–5.0)
BILIRUBIN TOTAL: 0.8 mg/dL (ref 0.3–1.2)
BUN: 22 mg/dL — AB (ref 6–20)
CALCIUM: 9.2 mg/dL (ref 8.9–10.3)
CO2: 30 mmol/L (ref 22–32)
Chloride: 104 mmol/L (ref 101–111)
Creatinine, Ser: 0.72 mg/dL (ref 0.44–1.00)
GFR calc Af Amer: >60 mL/min (ref >60)
Glucose, Bld: 92 mg/dL (ref 65–99)
POTASSIUM: 3.9 mmol/L (ref 3.5–5.1)
Sodium: 142 mmol/L (ref 135–145)
TOTAL PROTEIN: 6.1 g/dL — AB (ref 6.5–8.1)

## 2016-10-29 LAB — PROCALCITONIN: Procalcitonin: 0.21 ng/mL

## 2016-10-29 LAB — URINE CULTURE: Culture: <10000 — AB

## 2016-10-29 LAB — BLOOD GAS, ARTERIAL
ACID-BASE EXCESS: 4.2 mmol/L — AB (ref 0.0–2.0)
BICARBONATE: 30.5 mmol/L — AB (ref 20.0–28.0)
Drawn by: 270211
O2 Content: 4 L/min
O2 Saturation: 87.5 %
PCO2 ART: 58.8 mmHg — AB (ref 32.0–48.0)
PH ART: 7.336 — AB (ref 7.350–7.450)
PO2 ART: 59.2 mmHg — AB (ref 83.0–108.0)
Patient temperature: 99.1

## 2016-10-29 LAB — LACTIC ACID, PLASMA
LACTIC ACID, VENOUS: 1.2 mmol/L (ref 0.5–1.9)
Lactic Acid, Venous: 1 mmol/L (ref 0.5–1.9)

## 2016-10-29 LAB — TROPONIN I: TROPONIN I: 0.03 ng/mL — AB (ref <0.03)

## 2016-10-29 LAB — CORTISOL: CORTISOL PLASMA: 37.9 ug/dL

## 2016-10-29 MED ORDER — BUDESONIDE 0.5 MG/2ML IN SUSP
0.5000 mg | Freq: Two times a day (BID) | RESPIRATORY_TRACT | Status: DC
  Administered 2016-10-29 – 2016-11-04 (×14): 0.5 mg via RESPIRATORY_TRACT
  Filled 2016-10-29 (×14): qty 2

## 2016-10-29 MED ORDER — HEPARIN BOLUS VIA INFUSION
2500.0000 [IU] | Freq: Once | INTRAVENOUS | Status: AC
  Administered 2016-10-29: 2500 [IU] via INTRAVENOUS
  Filled 2016-10-29: qty 2500

## 2016-10-29 MED ORDER — SODIUM CHLORIDE 0.9 % IV BOLUS (SEPSIS)
500.0000 mL | Freq: Once | INTRAVENOUS | Status: AC
  Administered 2016-10-29: 500 mL via INTRAVENOUS

## 2016-10-29 MED ORDER — IOPAMIDOL (ISOVUE-370) INJECTION 76%
INTRAVENOUS | Status: AC
  Administered 2016-10-29: 90 mL
  Filled 2016-10-29: qty 100

## 2016-10-29 MED ORDER — METHYLPREDNISOLONE SODIUM SUCC 125 MG IJ SOLR
60.0000 mg | Freq: Four times a day (QID) | INTRAMUSCULAR | Status: DC
  Administered 2016-10-29 – 2016-10-31 (×8): 60 mg via INTRAVENOUS
  Filled 2016-10-29 (×8): qty 2

## 2016-10-29 MED ORDER — ADULT MULTIVITAMIN W/MINERALS CH
1.0000 | ORAL_TABLET | Freq: Every day | ORAL | Status: DC
  Administered 2016-10-29 – 2016-11-01 (×3): 1 via ORAL
  Filled 2016-10-29 (×6): qty 1

## 2016-10-29 MED ORDER — DEXTROSE 5 % IV SOLN
1.0000 g | Freq: Three times a day (TID) | INTRAVENOUS | Status: DC
  Administered 2016-10-29 – 2016-10-31 (×6): 1 g via INTRAVENOUS
  Filled 2016-10-29 (×6): qty 1

## 2016-10-29 MED ORDER — IPRATROPIUM-ALBUTEROL 0.5-2.5 (3) MG/3ML IN SOLN
3.0000 mL | Freq: Four times a day (QID) | RESPIRATORY_TRACT | Status: DC
  Administered 2016-10-29 (×2): 3 mL via RESPIRATORY_TRACT
  Filled 2016-10-29 (×2): qty 3

## 2016-10-29 MED ORDER — DEXTROSE 5 % IV SOLN
1.0000 g | INTRAVENOUS | Status: AC
  Administered 2016-10-29: 1 g via INTRAVENOUS
  Filled 2016-10-29: qty 1

## 2016-10-29 MED ORDER — VANCOMYCIN HCL IN DEXTROSE 750-5 MG/150ML-% IV SOLN
750.0000 mg | Freq: Two times a day (BID) | INTRAVENOUS | Status: DC
Start: 2016-10-29 — End: 2016-10-31
  Administered 2016-10-29 – 2016-10-31 (×4): 750 mg via INTRAVENOUS
  Filled 2016-10-29 (×4): qty 150

## 2016-10-29 MED ORDER — VANCOMYCIN HCL 10 G IV SOLR
1500.0000 mg | Freq: Once | INTRAVENOUS | Status: AC
  Administered 2016-10-29: 1500 mg via INTRAVENOUS
  Filled 2016-10-29: qty 1500

## 2016-10-29 MED ORDER — ACETAMINOPHEN 325 MG PO TABS
650.0000 mg | ORAL_TABLET | ORAL | Status: DC | PRN
  Filled 2016-10-29: qty 2

## 2016-10-29 MED ORDER — ENSURE ENLIVE PO LIQD
237.0000 mL | Freq: Three times a day (TID) | ORAL | Status: DC
  Administered 2016-10-29 – 2016-10-30 (×2): 237 mL via ORAL

## 2016-10-29 MED ORDER — IPRATROPIUM-ALBUTEROL 0.5-2.5 (3) MG/3ML IN SOLN
3.0000 mL | Freq: Four times a day (QID) | RESPIRATORY_TRACT | Status: DC
  Administered 2016-10-30 – 2016-11-01 (×12): 3 mL via RESPIRATORY_TRACT
  Filled 2016-10-29 (×12): qty 3

## 2016-10-29 MED ORDER — NALOXONE HCL 0.4 MG/ML IJ SOLN
0.4000 mg | INTRAMUSCULAR | Status: DC | PRN
  Administered 2016-10-29 (×2): 0.4 mg via INTRAVENOUS
  Filled 2016-10-29 (×2): qty 1

## 2016-10-29 MED ORDER — NALOXONE HCL 0.4 MG/ML IJ SOLN: 0.4000 mg | INTRAMUSCULAR | Status: DC | PRN

## 2016-10-29 MED ORDER — HEPARIN (PORCINE) IN NACL 100-0.45 UNIT/ML-% IJ SOLN
1300.0000 [IU]/h | INTRAMUSCULAR | Status: DC
  Administered 2016-10-29 – 2016-10-31 (×3): 1300 [IU]/h via INTRAVENOUS
  Filled 2016-10-29 (×4): qty 250

## 2016-10-29 NOTE — Progress Notes (Signed)
ANTICOAGULATION CONSULT NOTE - Initial Consult  Pharmacy Consult for Heparin Indication: pulmonary embolus  No Known Allergies  Patient Measurements: Height: '5\' 6"'$  (167.6 cm) Weight: 219 lb 9.3 oz (99.6 kg) IBW/kg (Calculated) : 59.3 Heparin Dosing Weight: 81 kg  Vital Signs: Temp: 100.2 F (37.9 C) (12/26 1202) Temp Source: Axillary (12/26 1202) BP: 139/45 (12/26 1536) Pulse Rate: 95 (12/26 1536)  Labs:  Recent Labs  10/10/2016 1448 10/16/2016 1822 10/26/2016 2249 10/29/16 0430 10/29/16 0908  HGB 10.6* 9.5*  --   --  9.5*  HCT 34.4* 31.4*  --   --  32.0*  PLT 302 295  --   --  298  CREATININE 0.68 0.64  --  0.85 0.72  TROPONINI 0.03* 0.03* 0.03* 0.03*  --     Estimated Creatinine Clearance: 75.7 mL/min (by C-G formula based on SCr of 0.72 mg/dL).   Medical History: Past Medical History:  Diagnosis Date  . Back pain 10/24/2016  . Cancer [facility]    right breast cancer  . COPD (chronic obstructive pulmonary disease) ([place])   . Encounter for antineoplastic chemotherapy 10/24/2016  . Goals of care, counseling/discussion 10/24/2016  . Oxygen dependent     Assessment:  70 yr female admitted on 12/25 with weakness.  PMH significant for recent diagnosis of NSCLC and was to begin radiation/palliative chemo this week.  Patient known to pharmacy for Vancomycin and Cefepime dosing for pneumonia.   CTAngio ordered and result was + PE  Upon admission patient was ordered Lovenox '40mg'$  sq q24h for VTE prophylaxis - last dose was given 12/25 @ 20:24  Pharmacy consulted to dose IV heparin for PE  Goal of Therapy:  Heparin level 0.3-0.7 units/ml Monitor platelets by anticoagulation protocol: Yes   Plan:   D/C Lovenox order  Heparin 2500 unit IV bolus x 1 (Lovenox dose was 20 hr ago) followed by heparin infusion @ 1300 units/hr  Check heparin level 8 hr after heparin started  Follow heparin level & CBC daily  Jarren Para, Toribio Harbour, PharmD 10/29/2016,4:11 PM

## 2016-10-29 NOTE — Consult Note (Addendum)
PULMONARY / CRITICAL CARE MEDICINE   Name: [name] MRN: [id number] DOB: 26-Sep-1946    ADMISSION DATE:  10/12/2016 CONSULTATION DATE:  10/29/16  REFERRING MD: Derrek Gu MD  CHIEF COMPLAINT:  Altered mental status, respiratory distress  HISTORY OF PRESENT ILLNESS:   70 year-old with recent diagnosis of  Stage IV (T3, N2, M1 B) non-small cell lung cancer, adenocarcinoma, with right malignant pleural effusion, liver, adrenal, bone metastasis, COPD [on 2 L home oxygen], hypertension, rheumatoid arthritis. She was diagnosed with malignancy in Nov 2017. She follows with Dr. Rogue Jury and the intention was to start palliative chemotherapy. She had a rt Pleurx catheter placed which had to be removed due to leakage around the catheter site.  She was admitted on 12/26 for generalized weakness, tachypnea, tachycardia, dyspnea. Chest x-ray shows bibasilar infiltrates concerning for HCAP with rt loculated effusion. Rapid response called on 12/26 for increasing confusion, dyspnea, T of 100.5. Transferred to stepdown and PCCM consulted.   PAST MEDICAL HISTORY :  She  has a past medical history of Back pain (10/24/2016); Cancer [facility]); COPD (chronic obstructive pulmonary disease) ([place]); Encounter for antineoplastic chemotherapy (10/24/2016); Goals of care, counseling/discussion (10/24/2016); and Oxygen dependent.  PAST SURGICAL HISTORY: She  has a past surgical history that includes Back surgery; Breast surgery (Right); Abdominal hysterectomy (1990); ir generic historical (10/07/2016); and ir generic historical (10/10/2016).  No Known Allergies  No current facility-administered medications on file prior to encounter.    Current Outpatient Prescriptions on File Prior to Encounter  Medication Sig  . albuterol (PROVENTIL HFA;VENTOLIN HFA) 108 (90 BASE) MCG/ACT inhaler Inhale 2 puffs into the lungs every 6 (six) hours as needed for wheezing or shortness of breath.   Marland Kitchen atorvastatin (LIPITOR) 40 MG tablet  Take 40 mg by mouth every evening.   . bisoprolol (ZEBETA) 10 MG tablet Take 1 tablet (10 mg total) by mouth daily.  . folic acid (FOLVITE) 1 MG tablet Take 1 tablet (1 mg total) by mouth daily.  . furosemide (LASIX) 40 MG tablet Take 1 tablet (40 mg total) by mouth daily.  Marland Kitchen guaiFENesin (MUCINEX) 600 MG 12 hr tablet Take 1 tablet (600 mg total) by mouth 2 (two) times daily.  Marland Kitchen lidocaine-prilocaine (EMLA) cream Apply 1 application topically as needed. (Patient taking differently: Apply 1 application topically daily as needed (port access). )  . morphine (MS CONTIN) 30 MG 12 hr tablet Take 1 tablet (30 mg total) by mouth every 12 (twelve) hours.  Marland Kitchen oxyCODONE-acetaminophen (PERCOCET) 10-325 MG per tablet Take 1 tablet by mouth every 6 (six) hours as needed for pain. (Patient taking differently: Take 1 tablet by mouth 5 (five) times daily as needed for pain. )  . predniSONE (DELTASONE) 5 MG tablet Take 10 mg by mouth every morning.  . pregabalin (LYRICA) 200 MG capsule Take 200 mg by mouth daily.   . SYMBICORT 160-4.5 MCG/ACT inhaler USE 2 PUFFS FIRST THING IN THE MORNING AND 2 PUFFS ABOUT 12 HOURS LATER  . dexamethasone (DECADRON) 4 MG tablet Take 1 tablet (4 mg total) by mouth 2 (two) times daily with a meal. (Patient not taking: Reported on 10/20/2016)  . feeding supplement, ENSURE ENLIVE, (ENSURE ENLIVE) LIQD Take 237 mLs by mouth 2 (two) times daily between meals. (Patient not taking: Reported on 10/07/2016)  . prochlorperazine (COMPAZINE) 10 MG tablet Take 1 tablet (10 mg total) by mouth every 6 (six) hours as needed for nausea or vomiting. (Patient not taking: Reported on 10/10/2016)    FAMILY  HISTORY:  Her indicated that her mother is deceased. She indicated that her father is deceased. She indicated that her sister is alive. She indicated that the status of her neg hx is unknown. She indicated that the status of her other is unknown.    SOCIAL HISTORY: She  reports that she quit smoking  about 2 years ago. Her smoking use included Cigarettes. She has a 117.00 pack-year smoking history. She has never used smokeless tobacco. She reports that she does not drink alcohol or use drugs.  REVIEW OF SYSTEMS:   Unable to obtain due to confusion  SUBJECTIVE:    VITAL SIGNS: BP (!) 121/48 (BP Location: Right Arm)   Pulse (!) 116   Temp 99.4 F (37.4 C) (Axillary)   Resp 18   Ht '5\' 6"'$  (1.676 m)   Wt 100.7 kg (222 lb 0.1 oz)   SpO2 (!) 89%   BMI 35.83 kg/m   HEMODYNAMICS:    VENTILATOR SETTINGS:    INTAKE / OUTPUT: I/O last 3 completed shifts: In: 3893.8 [I.V.:1893.8; IV [id number]:9735] Out: -   PHYSICAL EXAMINATION: General:  Moderate respiratory distress, confused Neuro:  No focal deficits HEENT: Moist mucous membranes, no thyromegaly, JVD  CardiovascularTachycardia, regular rate and rhythm Lungs: Diffuse bilateral wheezes, no crackles, diminished breath sounds at the base Abdomen: Distended, + BS Musculoskeletal; Normal tone and bulk, Trace edema Skin:  Intact  LABS:  BMET  Recent Labs Lab 10/21/2016 1448 10/21/2016 1822 10/29/16 0430 10/29/16 0908  NA 141  --  143 142  K 3.2*  --  3.9 3.9  CL 96*  --  103 104  CO2 32  --  31 30  BUN 27*  --  22* 22*  CREATININE 0.68 0.64 0.85 0.72  GLUCOSE 99  --  89 92    Electrolytes  Recent Labs Lab 10/11/2016 1448 10/29/16 0430 10/29/16 0908  CALCIUM 9.9 9.4 9.2    CBC  Recent Labs Lab 11/03/2016 1448 10/20/2016 1822 10/29/16 0908  WBC 14.6* 14.0* 11.2*  HGB 10.6* 9.5* 9.5*  HCT 34.4* 31.4* 32.0*  PLT 302 295 298    Coag's No results for input(s): APTT, INR in the last 168 hours.  Sepsis Markers  Recent Labs Lab 10/20/2016 1822 11/01/2016 2003 10/29/16 0908  LATICACIDVEN 1.4 1.1 1.2  PROCALCITON  --   --  0.21    ABG  Recent Labs Lab 10/29/16 0930  PHART 7.336*  PCO2ART 58.8*  PO2ART 59.2*    Liver Enzymes  Recent Labs Lab 10/24/16 1455 10/29/2016 1448 10/29/16 0908  AST 33  41 40  ALT '25 25 23  '$ ALKPHOS 120 108 94  BILITOT 0.73 1.1 0.8  ALBUMIN 2.4* 2.5* 2.2*    Cardiac Enzymes  Recent Labs Lab 10/06/2016 1822 10/04/2016 2249 10/29/16 0430  TROPONINI 0.03* 0.03* 0.03*    Glucose No results for input(s): GLUCAP in the last 168 hours.  Imaging Dg Chest 2 View  Result Date: 10/11/2016 CLINICAL DATA:  Shortness of breath.  History COPD.  Former smoker. EXAM: CHEST  2 VIEW COMPARISON:  PET-CT 10/22/2016 FINDINGS: Cardiomediastinal silhouette is normal. Mediastinal contours appear intact. Calcific atherosclerotic disease of the aorta seen. There is no evidence of pneumothorax. Nodular densities overlying the right hemithorax likely represent loculated pleural effusion. Malignant appearing nodules demonstrated by recent PET-CT in the right upper and right middle lobe are poorly visualized. Mild interstitial thickening on the right. Osseous structures are without acute abnormality. Soft tissues are grossly normal. IMPRESSION: No significant  change in findings suggestive of right lung malignancy with potential lymphangitic spread of disease. Electronically Signed   By: Fidela Salisbury M.D.   On: 10/23/2016 15:04   Dg Chest Port 1 View  Result Date: 10/29/2016 CLINICAL DATA:  Fever.  Unresponsive patient. EXAM: PORTABLE CHEST 1 VIEW COMPARISON:  Chest x-ray 10/12/2016.  Chest CT 10/14/2016 FINDINGS: 0813 hours. The cardio pericardial silhouette is enlarged. Bibasilar atelectasis/ infiltrate. Right pleural effusion with apparent loculation. The visualized bony structures of the thorax are intact. Telemetry leads overlie the chest. IMPRESSION: Cardiomegaly with bibasilar collapse/ consolidation and right greater than left pleural effusion, likely loculated. Electronically Signed   By: Misty Stanley M.D.   On: 10/29/2016 08:42    STUDIES:  CXR 12/26 > bibasal consolidation. Right greater than left effusion   CULTURES: Bcx X 2 12/26:  ANTIBIOTICS: Vancomycin  12/26 > Cefepime 12/26 >  SIGNIFICANT EVENTS:  LINES/TUBES:  DISCUSSION: 55 year with newly diagnosed stage IV adenocarcinoma of the lung with COPD. Now with acute hypercarbic respiratory failure with concern for HCAP, worsening malignant effusion. Transferred to SDU for bipap.  ASSESSMENT / PLAN: Stage IV lung cancer COPD Hypercarbic resp failiure with AECOP, may have HCAP, sepsis (but normal LA) R/O PE Rheumatoid arthritis (on chronic pred 10 mg)  - Start Bipap. Recheck ABG - CTA of chest when stable - Start pulmicort nebs, duonebs - IV solumedrol - Broad abx coverage with vanco, cefepime - Follow cultures, LA and Pct.  FAMILY  - Updates: No family at bedside. - Inter-disciplinary family meet or Palliative Care meeting due by:  11/05/16   Critical care time- 35 mins.  Marshell Garfinkel MD [facility] Pager [phone] If no answer or after 3pm call: [phone] 10/29/2016, 11:19 AM

## 2016-10-29 NOTE — Progress Notes (Signed)
Initial Nutrition Assessment  DOCUMENTATION CODES:   Severe malnutrition in context of acute illness/injury, Obesity unspecified  INTERVENTION:  Encouraged intake of adequate calories and protein through small, frequent meals.   Provide Ensure Enlive po TID, each supplement provides 350 kcal and 20 grams of protein. Patient requesting they be mixed in ice cream.  Provide multivitamin with minerals daily.  NUTRITION DIAGNOSIS:   Increased nutrient needs related to catabolic illness, cancer and cancer related treatments as evidenced by estimated needs.  GOAL:   Patient will meet greater than or equal to 90% of their needs  MONITOR:   PO intake, Supplement acceptance, Diet advancement, Weight trends, Labs, I & O's  REASON FOR ASSESSMENT:   Consult Poor PO  ASSESSMENT:   70 y.o. female with medical history significant of metastatic non small cell lung cancer with recurrent malignant right pleural effusion, COPD (on home oxygen of  [place]), HTN and RA who presents to the ED with general increasing weakness.  Patient states that she fell on the way to the bathroom.  Patients roommate and her sisters daughter attempted to get her up but were unsuccessful.  Patient's sister is bedside and states that patient has not eaten more than a few bites of a banana in the past week and is so weak that she falls because she cannot use her legs.  Patient's sister states that patient is in significant amount of pain and it is unbearable.  Patient reports pain in her lower left back.  Patient does have metastases in her hip and pelvis.   -Patient being followed by Palliative Medicine. Per note today patient to remain full code for now with continued aggressive treatment.  Spoke with patient and sister at bedside. She reports poor appetite for the past 1-1.5 weeks with decreased intake of only bites of banana and toast throughout the day. Patient reports her appetite is improving now and she believes she  will be able to eat better when diet advanced. Patient endorses nausea and constipation. Denies abdominal pain or difficulty chewing/swallowing.   UBW 241 lbs. Patient has lost 22 lbs (9% body weight) over 1 month, which is significant for time frame.  Medications reviewed and include: methylprednisolone 60 mg Q6hrs, vancomycin, NS @ 125 ml/hr.  Labs reviewed: BUN 22.   Nutrition-Focused physical exam completed. Findings are no fat depletion, no muscle depletion, and mild edema.   Patient meets criteria for severe acute malnutrition in setting of intake </= 50% for >/= 5 days, 9% weight loss in 1 month.  Discussed with RN. Patient's diet likely to be advanced tonight.   Diet Order:  Diet NPO time specified Except for: Sips with Meds  Skin:  Reviewed, no issues  Last BM:  Unknown  Height:   Ht Readings from Last 1 Encounters:  10/29/16 '5\' 6"'$  (1.676 m)    Weight:   Wt Readings from Last 1 Encounters:  10/29/16 219 lb 9.3 oz (99.6 kg)    Ideal Body Weight:  59.1 kg  BMI:  Body mass index is 35.44 kg/m.  Estimated Nutritional Needs:   Kcal:  1800-2000 (18-20 kcal/kg)  Protein:  120-130 grams (1.2-1.3 grams/kg)  Fluid:  >/= 1.8 L/day  EDUCATION NEEDS:   No education needs identified at this time  Willey Blade, MS, RD, LDN Pager: [phone] After Hours Pager: [phone]

## 2016-10-29 NOTE — Evaluation (Signed)
Physical Therapy Evaluation Patient Details Name: [name] MRN: [id number] DOB: 07-13-1946 Today's Date: 10/29/2016   History of Present Illness  70 y.o. female admitted with fall. PMH of metastatic lung ca (mets to pelvis and hip), COPD, RA.   Clinical Impression  Pt admitted with above diagnosis. Pt currently with functional limitations due to the deficits listed below (see PT Problem List). +2 total assist for bed mobility. Pt quite lethargic but does briefly open eyes and respond verbally to questions. Sat on edge of bed x 3 minutes with slouched posture. Minimal participation 2* lethargy. SaO2 89% on 4L O2 (pt is mouth breathing), HR 116 at rest.  SNF recommended.  Pt will benefit from skilled PT to increase their independence and safety with mobility to allow discharge to the venue listed below.       Follow Up Recommendations SNF;Supervision/Assistance - 24 hour    Equipment Recommendations  Wheelchair (measurements PT)    Recommendations for Other Services       Precautions / Restrictions Precautions Precautions: Fall Precaution Comments: monitor O2 Restrictions Weight Bearing Restrictions: No      Mobility  Bed Mobility Overal bed mobility: Needs Assistance Bed Mobility: Supine to Sit     Supine to sit: +2 for physical assistance;Total assist     General bed mobility comments: pt 10%, assist to raise trunk and advance BLEs, limited by lethargy   HR 116 at rest, SaO2 89% on 4L O2 (pt mouth breathing) at rest and with activity  Transfers                 General transfer comment: NT 2* lethargy and poor sitting balance  Ambulation/Gait                Stairs            Wheelchair Mobility    Modified Rankin (Stroke Patients Only)       Balance Overall balance assessment: Needs assistance Sitting-balance support: Feet unsupported;Bilateral upper extremity supported Sitting balance-Leahy Scale: Fair Sitting balance - Comments:  kyphotic posture, unable to lift head to neutral with assistance, likely limited by lethargy  Sat on EOB x 3 minutes with BUE supported                                   Pertinent Vitals/Pain Pain Assessment: No/denies pain    Home Living Family/patient expects to be discharged to:: Private residence Living Arrangements: Other relatives (sister) Available Help at Discharge: Family Type of Home: Apartment Home Access: Stairs to enter Entrance Stairs-Rails: Left Entrance Stairs-Number of Steps: 1 flight Home Layout: One level Home Equipment: Environmental consultant - 2 wheels;Cane - single point;Bedside commode Additional Comments: info obtained from prior admission, pt lethargic and inconsistent with responses to questions    Prior Function Level of Independence: Independent with assistive device(s)         Comments: uses cane vs RW (RW more so now)     Hand Dominance   Dominant Hand: Right    Extremity/Trunk Assessment   Upper Extremity Assessment Upper Extremity Assessment: Defer to OT evaluation    Lower Extremity Assessment Lower Extremity Assessment: Generalized weakness (+3/5 B knee extension, possibly limited by lethargy)    Cervical / Trunk Assessment Cervical / Trunk Assessment: Kyphotic  Communication   Communication: No difficulties  Cognition Arousal/Alertness: Lethargic;Suspect due to medications Behavior During Therapy: Flat affect Overall Cognitive Status: Impaired/Different from baseline  Area of Impairment: Problem solving               General Comments: pt very lethargic, opens eyes briefly and answers some questions though answers are inconsistent with home info in chart, follows 1 step commands, oriented to self and location    General Comments      Exercises     Assessment/Plan    PT Assessment Patient needs continued PT services  PT Problem List Decreased strength;Decreased activity tolerance;Decreased balance;Decreased  cognition;Decreased mobility;Obesity          PT Treatment Interventions Gait training;Therapeutic activities;Therapeutic exercise;Functional mobility training;Balance training;Patient/family education    PT Goals (Current goals can be found in the Care Plan section)  Acute Rehab PT Goals Patient Stated Goal: return to independence PT Goal Formulation: With patient Time For Goal Achievement: 11/12/16 Potential to Achieve Goals: Fair    Frequency Min 3X/week   Barriers to discharge        Co-evaluation               End of Session Equipment Utilized During Treatment: Oxygen Activity Tolerance: Patient limited by lethargy Patient left: in bed;with call bell/phone within reach;with family/visitor present;with bed alarm set Nurse Communication: Mobility status (HR 116, SaO2 89% on 4L O2)    Functional Assessment Tool Used: clinical judgement Functional Limitation: Mobility: Walking and moving around Mobility: Walking and Moving Around Current Status [phone]): At least 80 percent but less than 100 percent impaired, limited or restricted Mobility: Walking and Moving Around Goal Status [phone]): At least 40 percent but less than 60 percent impaired, limited or restricted    Time: 0840-0904 PT Time Calculation (min) (ACUTE ONLY): 24 min   Charges:   PT Evaluation $PT Eval Moderate Complexity: 1 Procedure PT Treatments $Therapeutic Activity: 8-22 mins   PT G Codes:   PT G-Codes **NOT FOR INPATIENT CLASS** Functional Assessment Tool Used: clinical judgement Functional Limitation: Mobility: Walking and moving around Mobility: Walking and Moving Around Current Status ([id number]): At least 80 percent but less than 100 percent impaired, limited or restricted Mobility: Walking and Moving Around Goal Status [phone]): At least 40 percent but less than 60 percent impaired, limited or restricted    Philomena Doheny 10/29/2016, 9:13 AM [phone]

## 2016-10-29 NOTE — Progress Notes (Signed)
Patient running a temperature of 101.0 axillary. RN informed. No order for anti-pyretics. On-call provider paged, order received to give tylenol. RN in patient's room to give medication. Unable to fully arouse. Patient responding to painful stimuli saying, "Yes ma'am," when name called. VSS. Patient received 30 mg of MS Contin and 200 mg of Lyrica at  20:24 last pm. No prn's given. RRT called. Patient opening eyes and answering more questions. Order received to give narcan. Held per nursing judgement as patient is waking up. Oncoming RN to speak with attending about medications.

## 2016-10-29 NOTE — Progress Notes (Addendum)
[facility] PROGRESS NOTE  [name] HEN:[id number] DOB: 1946/04/16 DOA: 10/27/2016   PCP: Merrilee Seashore, MD     Assessment/Plan: Principal Problem:   Weakness Active Problems:   COPD GOLD II   Essential hypertension   Hypokalemia   Goals of care, counseling/discussion   Back pain   Non-small cell carcinoma of lung ([place])    70 y.o. 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  Patient states that she fell on the way to the bathroom.  Patients roommate and her sisters daughter attempted to get her up but were unsuccessful. Patient evaluated by ED physician.  Potassium slightly low at 3.2, Troponin slightly elevated, WBC increased at 14.6.  Patient is tachypneic, tachycardic, afebrile, lactic acid was not ordered.  TRH was asked to admit for dyspnea, and generalized weakness. Patient was scheduled to start post chemotherapy 12/26  Assessment and plan Sepsis likely secondary to pneumonia Meets sepsis criteria Tachycardic tachypneic, febrile Will panculture Focused sepsis protocol initiated-check pro-calcitonin, lactic acid, ABG Start patient on broad-spectrum antibiotics Notify oncology Initial chest x-ray did not show any significant changes, suspected lymphangitis spread CT scan 12/12 showed multiple small pockets of loculated pleural fluid within the right hemithorax/pulmonary nodules right upper and right middle lobe Check UA Due to tachypnea patient placed on BiPAP Discussed CODE STATUS would like to be a full code Notified critical care, in case the patient decompensates ABG noted  ACUTE PE Nonocclusive Start heparin gtt, need to make a decision about Lovenox vs NOAC at DC   Weakness likely secondary to above -Hold off on PT/OT consult - SW consult as patient will  likely not be able to return back to previous apartment - progressively worsening Discontinue sedating medications  Anorexia - nutrition consult placed  Non- small cell carcinoma of the lung - supposed to go for radiation mapping tomorrow - patient postponed port placement due to weakness this past week - per notes from Dr. Julien Nordmann of oncology chemotherapy is palliative only, notified Dr. Julien Nordmann that patient is being moved to stepdown, and would like to be a full code - patient sister says she was just diagnosed 5 weeks ago - palliative care consulted  Hypokalemia - given 33mq in ED - will repeat BMP in am Check magnesium   Back Pain - secondary to metastases Hold MS Contin and Oxycodone given somnolence - will add PRN morphine and diluadid for breakthru pain      DVT prophylaxsis heparin gtt  Code Status:  Full code    Family Communication: Discussed in detail with the patient/neice, all imaging results, lab results explained to the patient   Disposition Plan:  Transfer to stepdown, critical care consulted      Consultants:  Called Dr. MJulien Nordmann12/26  Critical care  Procedures:  None  Antibiotics: Anti-infectives    None         HPI/Subjective: Apparently lethargic this morning and febrile at 5 AM, Responded to 2 doses of Narcan, noncommunicative Understands that she may need intubation if her breathing gets worse  Objective: Vitals:   10/04/2016 1855 10/10/2016 2046 10/29/2016 2138 10/29/16 0519  BP:   (!) 100/52 (!) 121/48  Pulse:   89 93  Resp:   18 18  Temp:   97.7 F (36.5 C) (!) 101 F (38.3  C)  TempSrc:   Oral Axillary  SpO2:  90% 97% 97%  Weight: 100.7 kg (222 lb 0.1 oz)     Height: '5\' 6"'$  (1.676 m)       Intake/Output Summary (Last 24 hours) at 10/29/16 8315 Last data filed at 10/29/16 0600  Gross per 24 hour  Intake          3893.75 ml  Output                0 ml  Net          3893.75 ml     Exam:  Examination:  General exam: Appears calm and comfortable  Respiratory system: Clear to auscultation. Respiratory effort normal. Cardiovascular system: S1 & S2 heard, RRR. No JVD, murmurs, rubs, gallops or clicks. No pedal edema. Gastrointestinal system: Abdomen is nondistended, soft and nontender. No organomegaly or masses felt. Normal bowel sounds heard. Central nervous system: Alert and oriented. No focal neurological deficits. Extremities: Symmetric 5 x 5 power. Skin: No rashes, lesions or ulcers Psychiatry: Judgement and insight appear normal. Mood & affect appropriate.     Data Reviewed: I have personally reviewed following labs and imaging studies  Micro Results No results found for this or any previous visit (from the past 240 hour(s)).  Radiology Reports Dg Chest 1 View  Result Date: 10/01/2016 CLINICAL DATA:  Post right thoracentesis EXAM: CHEST 1 VIEW COMPARISON:  09/28/2016 FINDINGS: Decreasing right pleural effusion following thoracentesis. No pneumothorax. Small right effusion and right base atelectasis persists. Heart is borderline in size. Left lung is clear. IMPRESSION: No pneumothorax following right thoracentesis. Small residual right effusion with right base atelectasis. Electronically Signed   By: Rolm Baptise M.D.   On: 10/01/2016 14:33   Dg Chest 2 View  Result Date: 10/04/2016 CLINICAL DATA:  Shortness of breath.  History COPD.  Former smoker. EXAM: CHEST  2 VIEW COMPARISON:  PET-CT 10/22/2016 FINDINGS: Cardiomediastinal silhouette is normal. Mediastinal contours appear intact. Calcific atherosclerotic disease of the aorta seen. There is no evidence of pneumothorax. Nodular densities overlying the right hemithorax likely represent loculated pleural effusion. Malignant appearing nodules demonstrated by recent PET-CT in the right upper and right middle lobe are poorly visualized. Mild interstitial thickening on the right. Osseous structures are without  acute abnormality. Soft tissues are grossly normal. IMPRESSION: No significant change in findings suggestive of right lung malignancy with potential lymphangitic spread of disease. Electronically Signed   By: Fidela Salisbury M.D.   On: 10/10/2016 15:04   Dg Chest 2 View  Result Date: 10/16/2016 CLINICAL DATA:  Follow-up pleural effusion . EXAM: CHEST  2 VIEW COMPARISON:  CT 10/14/2016.  Chest x-ray 10/14/2016 09/25/2016. FINDINGS: Mediastinum and hilar structures are normal. Heart size normal. Right lower lobe atelectasis and infiltrate. Small right pleural effusion. No acute bony abnormality identified. IMPRESSION: Right lower lobe atelectasis and infiltrate again noted. With small right pleural effusion noted. Similar findings noted on prior exam. No pneumothorax. Electronically Signed   By: Marcello Moores  Register   On: 10/16/2016 10:32   Dg Chest 2 View  Result Date: 10/14/2016 CLINICAL DATA:  Left-sided breast pain and shortness of breath. History of lung cancer. EXAM: CHEST  2 VIEW COMPARISON:  Chest radiograph 10/07/2016 and chest CT 09/24/2016 FINDINGS: Cardiomediastinal contours are unchanged. Small right pleural effusion with loculated component along the right lateral hemithorax is slightly decreased in size. Multiple nodular opacities overlying the right lung are unchanged. There is atherosclerotic calcification within the aortic arch. No  pneumothorax, focal airspace consolidation or pulmonary edema. IMPRESSION: 1. No acute cardiopulmonary disease. 2. Decreased size of partially loculated right pleural effusion and unchanged multifocal nodular opacities of the right hemithorax. 3. Aortic atherosclerosis. Electronically Signed   By: Ulyses Jarred M.D.   On: 10/14/2016 14:12   Dg Chest 2 View  Result Date: 10/07/2016 CLINICAL DATA:  Recurrent right pleural and fusion associated with shortness of breath and chest pain. History of COPD, former smoker, diabetes. Patient also has a history of  right-sided breast malignancy. EXAM: CHEST  2 VIEW COMPARISON:  Chest x-rays of October 04, 2016, September 24, 2016, and CT scan of the chest of September 24, 2016. FINDINGS: The left lung is mildly hyperinflated. There is no left-sided pleural effusion. On the right there is a small pleural effusion which has increased slightly in size over the past 3 days. There is no pneumothorax. There is stable ovoid density projecting over the posterior aspect of the right seventh rib and patchy interstitial density more inferiorly. Is calcification in the wall of the aortic arch. The heart and pulmonary vascularity are normal. The bony thorax exhibits no acute abnormality. The heart and pulmonary vascularity are normal. The mediastinum is normal in width. IMPRESSION: Slight interval increase in the volume of the small right pleural effusion. Stable underlying chronic bronchitic changes. Patchy density in the right mid lung is stable. Thoracic aortic atherosclerosis. Electronically Signed   By: David  Martinique M.D.   On: 10/07/2016 09:41   Dg Chest 2 View  Result Date: 10/04/2016 CLINICAL DATA:  Shortness of breath.  Ex-smoker. EXAM: CHEST  2 VIEW COMPARISON:  10/01/2016. FINDINGS: Increasing RIGHT pleural effusion. Compressive atelectasis at the RIGHT base but no definite consolidation. Trace LEFT pleural effusion. No pneumothorax. Normal heart size. IMPRESSION: Beginning re-accumulation of RIGHT pleural effusion post thoracentesis. Electronically Signed   By: Staci Righter M.D.   On: 10/04/2016 10:28   Ct Angio Chest Pe W Or Wo Contrast  Result Date: 10/14/2016 CLINICAL DATA:  Short of breath. Pleural drainage catheter removed 1 week prior. EXAM: CT ANGIOGRAPHY CHEST WITH CONTRAST TECHNIQUE: Multidetector CT imaging of the chest was performed using the standard protocol during bolus administration of intravenous contrast. Multiplanar CT image reconstructions and MIPs were obtained to evaluate the vascular anatomy.  CONTRAST:  70 mL Isovue COMPARISON:  CT 09/24/2016 FINDINGS: Cardiovascular: Coronary artery calcification and aortic atherosclerotic calcification. No pericardial fluid. Mediastinum/Nodes: No axillary or supraclavicular adenopathy. No mediastinal adenopathy. Lungs/Pleura: There are multiple small pockets of loculated pleural fluid along the posterior aspect of the RIGHT hemithorax. Interval reduction of moderate dependent effusion effusion seen on comparison exam. Some fluid extends along the fissure. No pneumothorax. Rounded nodule in the anterior RIGHT middle lobe measuring 9 mm (image 63, series 11. Nodule in the anterior RIGHT upper lobe additionally measuring 16 mm on image 44, series. These 2 pulmonary nodules are not changed comparison exam. LEFT lung clear Upper Abdomen: Limited view of the liver, kidneys, pancreas are unremarkable. Normal adrenal glands. Musculoskeletal: No aggressive osseous lesion. Review of the MIP images confirms the above findings. IMPRESSION: 1. Multiple small pockets of loculated pleural fluid within the RIGHT hemithorax replaces the previous seen moderate pleural effusion. 2. Stable pulmonary nodules the RIGHT upper lobe and RIGHT middle lobe of undetermined etiology. 3. No pneumothorax Electronically Signed   By: Suzy Bouchard M.D.   On: 10/14/2016 17:00   Mr Jeri Cos QI Contrast  Result Date: 10/04/2016 CLINICAL DATA:  New diagnosis of lung  cancer. History of breast cancer, hypertension rheumatoid arthritis. Assess for intracranial metastasis. EXAM: MRI HEAD WITHOUT AND WITH CONTRAST TECHNIQUE: Multiplanar, multiecho pulse sequences of the brain and surrounding structures were obtained without and with intravenous contrast. CONTRAST:  60m MULTIHANCE GADOBENATE DIMEGLUMINE 529 MG/ML IV SOLN COMPARISON:  None. FINDINGS: Mild motion degraded examination. INTRACRANIAL CONTENTS: No reduced diffusion to suggest acute ischemia or hypercellular tumor. No susceptibility artifact to  suggest hemorrhage. The ventricles and sulci are normal for patient's age. Patchy supratentorial white matter FLAIR T2 hyperintensities are less than expected for age, compatible with chronic small vessel ischemic disease. No suspicious parenchymal signal, masses, mass effect. No abnormal intraparenchymal or extra-axial enhancement. No abnormal extra-axial fluid collections. No extra-axial masses. VASCULAR: Normal major intracranial vascular flow voids present at skull base. SKULL AND UPPER CERVICAL SPINE: No abnormal sellar expansion. No suspicious calvarial bone marrow signal. Craniocervical junction maintained. Small RIGHT exostosis. SINUSES/ORBITS: RIGHT maxillary mucosal retention cyst. Small RIGHT mastoid effusion.The included ocular globes and orbital contents are non-suspicious. OTHER: None. IMPRESSION: No intracranial metastasis. Negative motion degraded MRI head with and without contrast for age. Electronically Signed   By: CElon AlasM.D.   On: 10/04/2016 18:35   Ir Guided DNiel HummerW Catheter Placement  Result Date: 10/07/2016 CLINICAL DATA:  History of lung cancer, now with recurrent symptomatic right-sided malignant effusion. Please perform tunneled right-sided pleural drainage catheter for palliative purposes. EXAM: INSERTION OF TUNNELED RIGHT SIDED PLEURAL DRAINAGE CATHETER COMPARISON:  Ultrasound-guided thoracentesis - 10/01/2016; 09/25/2016; chest radiograph - 10/07/2016 MEDICATIONS: Ancef 2 gm IV; Antibiotic was administered in an appropriate time interval for the procedure. ANESTHESIA/SEDATION: Moderate (conscious) sedation was employed during this procedure. A total of Versed 2 mg and Fentanyl 50 mcg was administered intravenously. Moderate Sedation Time: 14 minutes. The patient's level of consciousness and vital signs were monitored continuously by radiology nursing throughout the procedure under my direct supervision. FLUOROSCOPY TIME:  FLUOROSCOPY TIME 36 seconds (254.6mGy)  COMPLICATIONS: None immediate. PROCEDURE: The procedure, risks, benefits, and alternatives were explained to the patient, who wish to proceed with the placement of this permanent pleural catheter as the patient is seeking palliative care. The patient understand and consent to the procedure. The right inferior lateral chest and upper abdomen were prepped with Chlorhexidine in a sterile fashion, and a sterile drape was applied covering the operative field. A sterile gown and sterile gloves were used for the procedure. Initial ultrasound scanning and fluoroscopic imaging demonstrates a recurrent moderate to large pleural effusion. Under direct ultrasound guidance, the right inferior lateral pleural space was accessed with a Yueh sheath needle after the overlying soft tissues were anesthetized with 1% lidocaine with epinephrine. An Amplatz super stiff wire was then advanced under fluoroscopy into the pleural space. A 15.5 French tunneled Pleur-X catheter was tunneled from an incision within the right upper abdominal quadrant to the access site. The pleural access site was serially dilated under fluoroscopy, ultimately allowing placement of a peel-away sheath. The catheter was advanced through the peel-away sheath. The sheath was then removed. Final catheter positioning was confirmed with a fluoroscopic radiographic image. The access incision was closed with subcutaneous subcuticular 4-0 Vicryl, Dermabond and Steri-Strips. A Prolene retention suture was applied at the catheter exit site. Large volume thoracentesis was performed through the new catheter utilizing provided bulb vacuum assisted drainage bag. The patient tolerated the above procedure well without immediate postprocedural complication. FINDINGS: Preprocedural ultrasound scanning demonstrates a recurrent moderate sized right sided pleural effusion. After ultrasound and fluoroscopic guided  placement, the catheter is coiled within the caudal aspect of the right  hemi thorax at the location of the patient's recurrent malignant effusion. Following catheter placement, approximately 1.3 L of serous though slightly blood tinged pleural fluid was removed. IMPRESSION: Successful placement of permanent, tunneled right pleural drainage catheter via lateral approach. Approximately 1.3 L of serous though slightly blood tinged pleural fluid was removed after catheter placement. Electronically Signed   By: Sandi Mariscal M.D.   On: 10/07/2016 16:42   Nm Pet Image Initial (pi) Skull Base To Thigh  Result Date: 10/22/2016 CLINICAL DATA:  Initial treatment strategy for right lung adenocarcinoma. EXAM: NUCLEAR MEDICINE PET SKULL BASE TO THIGH TECHNIQUE: 11.4 mCi F-18 FDG was injected intravenously. Full-ring PET imaging was performed from the skull base to thigh after the radiotracer. CT data was obtained and used for attenuation correction and anatomic localization. FASTING BLOOD GLUCOSE:  Value: 160 mg/dl COMPARISON:  Chest CTA on 10/14/2016 FINDINGS: NECK No hypermetabolic lymph nodes in the neck. CHEST Small multiloculated right pleural effusion shows diffuse hypermetabolic activity, consistent with malignant pleural effusion. A spiculated nodule in the anterior right upper lobe abutting the pleural surface on image 38/2 is hypermetabolic, with SUV max of 12.2. A 10 mm smoothly marginated pulmonary nodule in the right middle lobe on image 48/7 shows mild hypermetabolism with SUV max of 4.4. No suspicious pulmonary nodule seen in the left lung. 8 mm subcarinal lymph node on image 50/5 is hypermetabolic with SUV max of 8.3. Hypermetabolic activity also seen in right hilum with SUV max of 12.2 ABDOMEN/PELVIS Multiple small hypermetabolic lesions are seen throughout the right and left hepatic lobes, consistent with diffuse liver metastases. 1.8 cm left adrenal mass is hypermetabolic with SUV max of 39.7, consistent with left adrenal metastasis. No other hypermetabolic masses or  lymphadenopathy identified within the abdomen or pelvis. Colonic diverticulosis is noted, without evidence of diverticulitis. Previous hysterectomy. SKELETON Diffuse hypermetabolic lytic bone metastases are seen throughout the spine, bilateral ribs, bilateral scapulae and right humeral head, pelvis, and bilateral hips, consistent with diffuse bone metastases. Several pathologic right rib fractures are noted. IMPRESSION: 16 mm hypermetabolic spiculated nodule in the anterior right upper lobe, and 10 mm hypermetabolic smoothly marginated nodule in right middle lobe, which could represent primary bronchogenic carcinoma or pulmonary metastases. Small malignant multiloculated right pleural effusion. Small hypermetabolic right hilar and subcarinal mediastinal lymph nodes, consistent with metastatic disease. Diffuse liver metastases. Small left adrenal metastasis. Diffuse lytic bone metastases. Several pathologic right rib fractures also noted. Electronically Signed   By: Earle Gell M.D.   On: 10/22/2016 11:53   Ir Removal Of Plural Cath W/cuff  Result Date: 10/10/2016 INDICATION: 70 year old female with a history of malignant right-sided pleural effusion and PleurX catheter placed 10/07/2016. She presents today for malfunctioning catheter. EXAM: IR REMOVAL OF PLEURAL CATH WITH CUFF MEDICATIONS: The patient is currently admitted to the hospital and receiving intravenous antibiotics. The antibiotics were administered within an appropriate time frame prior to the initiation of the procedure. ANESTHESIA/SEDATION: None COMPLICATIONS: None PROCEDURE: Informed written consent was obtained from the patient after a thorough discussion of the procedural risks, benefits and alternatives. All questions were addressed. Maximal Sterile Barrier Technique was utilized including caps, mask, sterile gowns, sterile gloves, sterile drape, hand hygiene and skin antiseptic. A timeout was performed prior to the initiation of the procedure.  Patient was positioned supine position on the fluoroscopy table. 1% lidocaine was used for local anesthesia. Spot images of the chest were performed.  Contrast injection was performed of the catheter. Glidewire was advanced through the catheter in attempt to reposition. Retention suture was ligated in order to Dermabond the skin. Catheter was damaged during the manipulation requiring attempt at exchange. Stiff wire was advanced through the catheter which was removed from the stiff wire. Combination of the peel-away sheath, Kumpe catheter were used in order to stiff in the tract an allow passage of the new catheter over the wiring catheter combination. Exchange of the catheter failed, with inability to passed through the soft tissue tract and enter the pleura. Catheter was removed and dressing was placed. Patient tolerated the procedure well and remained hemodynamically stable throughout. No complications were encountered and no significant blood loss encountered. FINDINGS: Initial image demonstrates catheter coiled at the base of the lung, potentially subpulmonic. Contrast injection demonstrates contrast accumulating within the pleural is a space. Wire manipulation failed to manipulate the catheter to more superior position. Images demonstrate failed catheter exchange given the damaged catheter. IMPRESSION: Status post failed attempt at exchange of a tunneled right chest PleurX catheter. Signed, Dulcy Fanny. Earleen Newport, DO [facility] Radiology PLAN: The patient will return for a scheduled visit at the [facility] with the chest x-ray to determine need for new PleurX catheter placement versus potential repeat thoracentesis preceding her oncology plan. Electronically Signed   By: Corrie Mckusick D.O.   On: 10/10/2016 16:52   US Thoracentesis Asp Pleural Space W/img Guide  Result Date: 10/01/2016 INDICATION: Recurrent right pleural effusion of unknown etiology. Request for  diagnostic and therapeutic thoracentesis. EXAM: ULTRASOUND GUIDED DIAGNOSTIC AND THERAPEUTIC THORACENTESIS MEDICATIONS: 1% Lidocaine COMPLICATIONS: None immediate. PROCEDURE: An ultrasound guided thoracentesis was thoroughly discussed with the patient and questions answered. The benefits, risks, alternatives and complications were also discussed. The patient understands and wishes to proceed with the procedure. Written consent was obtained. Ultrasound was performed to localize and mark an adequate pocket of fluid in the right chest. The area was then prepped and draped in the normal sterile fashion. 1% Lidocaine was used for local anesthesia. Under ultrasound guidance a Safe-T-Centesis catheter was introduced. Thoracentesis was performed. The catheter was removed and a dressing applied. FINDINGS: A total of approximately 1.2L of serosanguineous fluid was removed. Samples were sent to the laboratory as requested by the clinical team. IMPRESSION: Successful ultrasound guided right thoracentesis yielding 1.2L of pleural fluid. Read by: Saverio Danker, PA-C Electronically Signed   By: Lucrezia Europe M.D.   On: 10/01/2016 14:31     CBC  Recent Labs Lab 10/24/16 1455 10/27/2016 1448 10/17/2016 1822  WBC 11.6* 14.6* 14.0*  HGB 10.5* 10.6* 9.5*  HCT 34.2* 34.4* 31.4*  PLT 302 302 295  MCV 86.4 86.0 87.0  MCH 26.5 26.5 26.3  MCHC 30.7* 30.8 30.3  RDW 16.1* 16.7* 17.0*  LYMPHSABS 1.2 2.0  --   MONOABS 0.8 0.8  --   EOSABS 0.0 0.0  --   BASOSABS 0.0 0.0  --     Chemistries   Recent Labs Lab 10/24/16 1455 10/30/2016 1448 10/21/2016 1822 10/29/16 0430  NA 142 141  --  143  K 3.3* 3.2*  --  3.9  CL  --  96*  --  103  CO2 31* 32  --  31  GLUCOSE 137 99  --  89  BUN 19.3 27*  --  22*  CREATININE 0.7 0.68 0.64 0.85  CALCIUM 10.1 9.9  --  9.4  AST 33 41  --   --  ALT 25 25  --   --   ALKPHOS 120 108  --   --   BILITOT 0.73 1.1  --   --     ------------------------------------------------------------------------------------------------------------------ estimated creatinine clearance is 71.7 mL/min (by C-G formula based on SCr of 0.85 mg/dL). ------------------------------------------------------------------------------------------------------------------ No results for input(s): HGBA1C in the last 72 hours. ------------------------------------------------------------------------------------------------------------------ No results for input(s): CHOL, HDL, LDLCALC, TRIG, CHOLHDL, LDLDIRECT in the last 72 hours. ------------------------------------------------------------------------------------------------------------------ No results for input(s): TSH, T4TOTAL, T3FREE, THYROIDAB in the last 72 hours.  Invalid input(s): FREET3 ------------------------------------------------------------------------------------------------------------------ No results for input(s): VITAMINB12, FOLATE, FERRITIN, TIBC, IRON, RETICCTPCT in the last 72 hours.  Coagulation profile No results for input(s): INR, PROTIME in the last 168 hours.  No results for input(s): DDIMER in the last 72 hours.  Cardiac Enzymes  Recent Labs Lab 10/07/2016 1822 10/13/2016 2249 10/29/16 0430  TROPONINI 0.03* 0.03* 0.03*   ------------------------------------------------------------------------------------------------------------------ Invalid input(s): POCBNP   CBG: No results for input(s): GLUCAP in the last 168 hours.     Studies: Dg Chest 2 View  Result Date: 10/26/2016 CLINICAL DATA:  Shortness of breath.  History COPD.  Former smoker. EXAM: CHEST  2 VIEW COMPARISON:  PET-CT 10/22/2016 FINDINGS: Cardiomediastinal silhouette is normal. Mediastinal contours appear intact. Calcific atherosclerotic disease of the aorta seen. There is no evidence of pneumothorax. Nodular densities overlying the right hemithorax likely represent loculated pleural  effusion. Malignant appearing nodules demonstrated by recent PET-CT in the right upper and right middle lobe are poorly visualized. Mild interstitial thickening on the right. Osseous structures are without acute abnormality. Soft tissues are grossly normal. IMPRESSION: No significant change in findings suggestive of right lung malignancy with potential lymphangitic spread of disease. Electronically Signed   By: Fidela Salisbury M.D.   On: 10/09/2016 15:04      Lab Results  Component Value Date   HGBA1C 6.4 (H) 12/03/2013   Lab Results  Component Value Date   LDLCALC 73 12/05/2013   CREATININE 0.85 10/29/2016       Scheduled Meds: . atorvastatin  40 mg Oral QPM  . bisoprolol  10 mg Oral Daily  . enoxaparin (LOVENOX) injection  40 mg Subcutaneous Q24H  . feeding supplement (ENSURE ENLIVE)  237 mL Oral BID BM  . furosemide  40 mg Oral Daily  . guaiFENesin  600 mg Oral BID  . mometasone-formoterol  2 puff Inhalation BID  . predniSONE  10 mg Oral Q breakfast  . sodium chloride  500 mL Intravenous Once   Continuous Infusions: . sodium chloride 125 mL/hr at 10/29/16 0158     LOS: 0 days    Time spent: >30 MINS    [facility]  [facility] Pager [phone]. If 7PM-7AM, please contact night-coverage at [email or web address], password [facility] 10/29/2016, 8:07 AM  LOS: 0 days

## 2016-10-29 NOTE — Progress Notes (Signed)
OT Cancellation Note  Patient Details Name: [name] MRN: [id number] DOB: Jan 25, 1946   Cancelled Treatment:    Reason Eval/Treat Not Completed: Medical issues which prohibited therapy.  Pt moved to step down due respiratory failure.  Will monitor and initiate OT eval if/when medically appropriate.  [place], OTR/L [phone]     [name] 10/29/2016, 12:00 PM

## 2016-10-29 NOTE — Progress Notes (Signed)
Pharmacy Antibiotic Note  [name] is a 70 y.o. female admitted on 10/10/2016 with progressive weakness. She was recently diagnosed with NSCLC and plans to start radiation & palliative chemotherapy this week.  She spiked fever overnight (101F axillary).  Pharmacy has been consulted for Vancomycin & Cefepime dosing.  Blood cx pending.   10/29/2016:  CXR +PNA  Mild leukocytosis (WBC 14, ANC 11.7)  Good renal function, slight increase in Scr today.  Estimated CrCl~30m/min  Plan:  Vancomycin '1500mg'$  IV x1 now then '750mg'$  IV q12h (goal trough 15-20)  Cefepime 1gm IV q8h  F/U cx data, renal function, clinical course  Height: '5\' 6"'$  (167.6 cm) Weight: 222 lb 0.1 oz (100.7 kg) IBW/kg (Calculated) : 59.3  Temp (24hrs), Avg:98.9 F (37.2 C), Min:97.7 F (36.5 C), Max:101 F (38.3 C)   Recent Labs Lab 10/24/16 1455 10/24/16 1455 10/18/2016 1448 10/26/2016 1822 10/23/2016 2003 10/29/16 0430  WBC 11.6*  --  14.6* 14.0*  --   --   CREATININE  --  0.7 0.68 0.64  --  0.85  LATICACIDVEN  --   --   --  1.4 1.1  --     Estimated Creatinine Clearance: 71.7 mL/min (by C-G formula based on SCr of 0.85 mg/dL).    No Known Allergies  Antimicrobials this admission: 12/26 Vanc>> 12/26 Cefepime>>  Dose adjustments this admission:  Microbiology results: 12/26 BCx: ordered 12/25 UCx: sent   Thank you for allowing pharmacy to be a part of this patient's care.  MNetta Cedars PharmD, BCPS Pager: [phone]/26/2017 8:06 AM

## 2016-10-29 NOTE — Consult Note (Signed)
Consultation Note Date: 10/29/2016   Patient Name: [name]  DOB: 02/19/1946  MRN: [id number]  Age / Sex: 70 y.o., female  PCP: Merrilee Seashore, MD Referring Physician: Reyne Dumas, MD  Reason for Consultation: Establishing goals of care  HPI/Patient Profile: 70 y.o. female  with past medical history of COPD, newly diagnosed lung ca (Stage IV, [facility], liver, left adrenal and bone mets, scheduled to start radiation tomorrow and first cycle of palliative chemotherapy on 12/28) admitted on 10/30/2016 with general increasing weakness. Workup reveals sepsis likely r/t pneumonia (now on Bipap), and possibly lymphangitic spread of lung ca.  Clinical Assessment and Goals of Care: Met with patient, her sister and her niece at bedside. Patient's sister, Dorian Pod, is patient's Air traffic controller. Patient was living with a roommate and was independent with ADL's prior to this admission. Discussed patient's illness and disease trajectory. Patient and family state that original goals of care discussed with oncology were "to get better". Patient and family not aware that patient's illness is considered terminal. Discussed current state and goals. For now patient wants to continue all aggressive care. Discussed code status and possible outcomes for patient with advanced lung disease. Patient very open and receptive to information. Requests to continue full code for now, but will discuss options with family overnight. She denies pain, only complaint is dry mouth. . Primary Decision Maker PATIENT with help form her sister, Dorian Pod and her niece    SUMMARY OF RECOMMENDATIONS -Full code for now -Continue all aggressive treatment -PMT will continue [place] discussion with patient and continue to follow for progression -Swab mouth with water for comfort  Code Status/Advance Care Planning:  Full code   Palliative Prophylaxis:     Frequent Pain Assessment and Oral Care  Additional Recommendations (Limitations, Scope, Preferences):  Full Scope Treatment  Psycho-social/Spiritual:   Desire for further Chaplaincy support:No   Prognosis:    Unable to determine  Discharge Planning: To Be Determined  Primary Diagnoses: Present on Admission: . Non-small cell carcinoma of lung ([place]) . Hypokalemia . Essential hypertension . COPD GOLD II . Back pain   I have reviewed the medical record, interviewed the patient and family, and examined the patient. The following aspects are pertinent.  Past Medical History:  Diagnosis Date  . Back pain 10/24/2016  . Cancer [facility])    right breast cancer  . COPD (chronic obstructive pulmonary disease) ([place])   . Encounter for antineoplastic chemotherapy 10/24/2016  . Goals of care, counseling/discussion 10/24/2016  . Oxygen dependent    Social History   Social History  . Marital status: Single    Spouse name: N/A  . Number of children: N/A  . Years of education: N/A   Occupational History  . retired from [employer]  . Smoking status: Former Smoker    Packs/day: 3.00    Years: 39.00    Types: Cigarettes    Quit date: 12/03/2013  . Smokeless tobacco: Never Used     Comment: Using e-cig  frequently 04/15/14  . Alcohol use No  . Drug use: No  . Sexual activity: Not Asked   Other Topics Concern  . None   Social History Narrative  . None   Family History  Problem Relation Age of Onset  . Breast cancer Mother   . AAA (abdominal aortic aneurysm) Father   . Lung cancer Sister   . Cancer Other   . Diabetes Neg Hx   . CVA Neg Hx   . CAD Neg Hx    Scheduled Meds: . atorvastatin  40 mg Oral QPM  . budesonide (PULMICORT) nebulizer solution  0.5 mg Nebulization BID  . ceFEPime (MAXIPIME) IV  1 g Intravenous Q8H  . enoxaparin (LOVENOX) injection  40 mg Subcutaneous Q24H  . feeding supplement (ENSURE ENLIVE)  237 mL Oral BID BM   . guaiFENesin  600 mg Oral BID  . ipratropium-albuterol  3 mL Nebulization Q6H  . methylPREDNISolone (SOLU-MEDROL) injection  60 mg Intravenous Q6H  . vancomycin  750 mg Intravenous Q12H   Continuous Infusions: . sodium chloride 125 mL/hr at 10/29/16 0158   PRN Meds:.acetaminophen, albuterol, HYDROmorphone (DILAUDID) injection, lidocaine-prilocaine, morphine injection, naLOXone (NARCAN)  injection, [DISCONTINUED] oxyCODONE-acetaminophen **AND** oxyCODONE Medications Prior to Admission:  Prior to Admission medications   Medication Sig Start Date End Date Taking? Authorizing Provider  albuterol (PROVENTIL HFA;VENTOLIN HFA) 108 (90 BASE) MCG/ACT inhaler Inhale 2 puffs into the lungs every 6 (six) hours as needed for wheezing or shortness of breath.    Yes Historical Provider, MD  atorvastatin (LIPITOR) 40 MG tablet Take 40 mg by mouth every evening.    Yes Historical Provider, MD  bisoprolol (ZEBETA) 10 MG tablet Take 1 tablet (10 mg total) by mouth daily. 10/18/16  Yes Nishant Dhungel, MD  folic acid (FOLVITE) 1 MG tablet Take 1 tablet (1 mg total) by mouth daily. 10/24/16  Yes Curt Bears, MD  furosemide (LASIX) 40 MG tablet Take 1 tablet (40 mg total) by mouth daily. 10/18/16  Yes Nishant Dhungel, MD  guaiFENesin (MUCINEX) 600 MG 12 hr tablet Take 1 tablet (600 mg total) by mouth 2 (two) times daily. 10/17/16  Yes Nishant Dhungel, MD  lidocaine-prilocaine (EMLA) cream Apply 1 application topically as needed. Patient taking differently: Apply 1 application topically daily as needed (port access).  10/24/16  Yes Curt Bears, MD  morphine (MS CONTIN) 30 MG 12 hr tablet Take 1 tablet (30 mg total) by mouth every 12 (twelve) hours. 10/24/16  Yes Curt Bears, MD  oxyCODONE-acetaminophen (PERCOCET) 10-325 MG per tablet Take 1 tablet by mouth every 6 (six) hours as needed for pain. Patient taking differently: Take 1 tablet by mouth 5 (five) times daily as needed for pain.  12/08/13  Yes  Theodis Blaze, MD  predniSONE (DELTASONE) 5 MG tablet Take 10 mg by mouth every morning. 08/30/16  Yes Historical Provider, MD  pregabalin (LYRICA) 200 MG capsule Take 200 mg by mouth daily.    Yes Historical Provider, MD  SYMBICORT 160-4.5 MCG/ACT inhaler USE 2 PUFFS FIRST THING IN THE MORNING AND 2 PUFFS ABOUT 12 HOURS LATER 05/22/15  Yes Tanda Rockers, MD  dexamethasone (DECADRON) 4 MG tablet Take 1 tablet (4 mg total) by mouth 2 (two) times daily with a meal. Patient not taking: Reported on 10/24/2016 10/24/16   Curt Bears, MD  feeding supplement, ENSURE ENLIVE, (ENSURE ENLIVE) LIQD Take 237 mLs by mouth 2 (two) times daily between meals. Patient not taking: Reported on 10/15/2016 10/08/16   Nishant  Dhungel, MD  prochlorperazine (COMPAZINE) 10 MG tablet Take 1 tablet (10 mg total) by mouth every 6 (six) hours as needed for nausea or vomiting. Patient not taking: Reported on 10/08/2016 10/24/16   Curt Bears, MD   No Known Allergies Review of Systems  Constitutional: Positive for fatigue and fever.  Respiratory: Positive for shortness of breath.   Neurological: Positive for weakness.  All other systems reviewed and are negative.   Physical Exam  Constitutional: She is oriented to person, place, and time. She appears well-developed and well-nourished. No distress.  Cardiovascular: Regular rhythm.   Tachycardic, heart sounds distant  Pulmonary/Chest:  Bipap in place  Abdominal: Soft. Bowel sounds are normal.  Neurological: She is alert and oriented to person, place, and time.  Skin: Skin is warm and dry.    Vital Signs: BP (!) 147/58   Pulse (!) 103   Temp 100.2 F (37.9 C) (Axillary)   Resp (!) 28   Ht 5' 6"  (1.676 m)   Wt 99.6 kg (219 lb 9.3 oz)   SpO2 96%   BMI 35.44 kg/m  Pain Assessment: 0-10   Pain Score: 4    SpO2: SpO2: 96 % O2 Device:SpO2: 96 % O2 Flow Rate: .O2 Flow Rate (L/min): 6 L/min  IO: Intake/output summary:  Intake/Output Summary (Last 24  hours) at 10/29/16 1311 Last data filed at 10/29/16 1018  Gross per 24 hour  Intake          4393.75 ml  Output              600 ml  Net          3793.75 ml    LBM:   Baseline Weight: Weight: 100.7 kg (222 lb 0.1 oz) Most recent weight: Weight: 99.6 kg (219 lb 9.3 oz)     Palliative Assessment/Data: PPS: 10%     Thank you for this consult. Palliative medicine will continue to follow and assist as needed.   Time In: 1230 Time Out: 1345 Time Total: 75 minutes Greater than 50%  of this time was spent counseling and coordinating care related to the above assessment and plan.  Signed by: Mariana Kaufman, AGNP-C Palliative Medicine    Please contact Palliative Medicine Team phone at [phone] for questions and concerns.  For individual provider: See Shea Evans

## 2016-10-30 ENCOUNTER — Telehealth: Payer: Self-pay

## 2016-10-30 ENCOUNTER — Ambulatory Visit: Payer: Medicare Other | Admitting: Radiation Oncology

## 2016-10-30 DIAGNOSIS — J9601 Acute respiratory failure with hypoxia: Secondary | ICD-10-CM

## 2016-10-30 DIAGNOSIS — R0602 Shortness of breath: Secondary | ICD-10-CM

## 2016-10-30 LAB — BLOOD GAS, ARTERIAL
ACID-BASE EXCESS: 3.6 mmol/L — AB (ref 0.0–2.0)
Bicarbonate: 27.8 mmol/L (ref 20.0–28.0)
Drawn by: 295031
O2 CONTENT: 5 L/min
O2 SAT: 92.3 %
PCO2 ART: 42.1 mmHg (ref 32.0–48.0)
PH ART: 7.435 (ref 7.350–7.450)
PO2 ART: 65.2 mmHg — AB (ref 83.0–108.0)
Patient temperature: 98.6

## 2016-10-30 LAB — HEPARIN LEVEL (UNFRACTIONATED)
HEPARIN UNFRACTIONATED: 0.44 [IU]/mL (ref 0.30–0.70)
Heparin Unfractionated: 0.44 IU/mL (ref 0.30–0.70)

## 2016-10-30 LAB — COMPREHENSIVE METABOLIC PANEL
ALK PHOS: 91 U/L (ref 38–126)
ALT: 25 U/L (ref 14–54)
AST: 42 U/L — AB (ref 15–41)
Albumin: 2.3 g/dL — ABNORMAL LOW (ref 3.5–5.0)
Anion gap: 8 (ref 5–15)
BUN: 19 mg/dL (ref 6–20)
CALCIUM: 9 mg/dL (ref 8.9–10.3)
CO2: 29 mmol/L (ref 22–32)
CREATININE: 0.55 mg/dL (ref 0.44–1.00)
Chloride: 108 mmol/L (ref 101–111)
Glucose, Bld: 168 mg/dL — ABNORMAL HIGH (ref 65–99)
Potassium: 4 mmol/L (ref 3.5–5.1)
Sodium: 145 mmol/L (ref 135–145)
Total Bilirubin: 0.9 mg/dL (ref 0.3–1.2)
Total Protein: 5.8 g/dL — ABNORMAL LOW (ref 6.5–8.1)

## 2016-10-30 LAB — CBC
HEMATOCRIT: 32 % — AB (ref 36.0–46.0)
HEMOGLOBIN: 9.5 g/dL — AB (ref 12.0–15.0)
MCH: 26.2 pg (ref 26.0–34.0)
MCHC: 29.7 g/dL — ABNORMAL LOW (ref 30.0–36.0)
MCV: 88.4 fL (ref 78.0–100.0)
Platelets: 279 10*3/uL (ref 150–400)
RBC: 3.62 MIL/uL — AB (ref 3.87–5.11)
RDW: 17.1 % — ABNORMAL HIGH (ref 11.5–15.5)
WBC: 7.8 10*3/uL (ref 4.0–10.5)

## 2016-10-30 MED ORDER — ROPINIROLE HCL 1 MG PO TABS
1.0000 mg | ORAL_TABLET | Freq: Every day | ORAL | Status: DC
  Administered 2016-10-30 – 2016-11-04 (×5): 1 mg via ORAL
  Filled 2016-10-30 (×8): qty 1

## 2016-10-30 MED ORDER — METOPROLOL TARTRATE 5 MG/5ML IV SOLN
5.0000 mg | Freq: Four times a day (QID) | INTRAVENOUS | Status: DC | PRN
  Administered 2016-10-30 – 2016-11-02 (×5): 5 mg via INTRAVENOUS
  Filled 2016-10-30 (×5): qty 5

## 2016-10-30 NOTE — Progress Notes (Signed)
PULMONARY / CRITICAL CARE MEDICINE   Name: [name] MRN: [id number] DOB: 1945/12/17    ADMISSION DATE:  10/23/2016 CONSULTATION DATE:  10/29/16  REFERRING MD: Derrek Gu MD  CHIEF COMPLAINT:  Altered mental status, respiratory distress  HISTORY OF PRESENT ILLNESS:   70 year-old with recent diagnosis of  Stage IV (T3, N2, M1 B) non-small cell lung cancer, adenocarcinoma, with right malignant pleural effusion, liver, adrenal, bone metastasis, COPD [on 2 L home oxygen], hypertension, rheumatoid arthritis. She was diagnosed with malignancy in Nov 2017. She follows with Dr. Rogue Jury and the intention was to start palliative chemotherapy. She had a rt Pleurx catheter placed which had to be removed due to leakage around the catheter site.  She was admitted on 12/26 for generalized weakness, tachypnea, tachycardia, dyspnea. Chest x-ray shows bibasilar infiltrates concerning for HCAP with rt loculated effusion. Rapid response called on 12/26 for increasing confusion, dyspnea, T of 100.5. Transferred to stepdown and PCCM consulted.   PAST MEDICAL HISTORY :  She  has a past medical history of Back pain (10/24/2016); Cancer [facility]); COPD (chronic obstructive pulmonary disease) ([place]); Encounter for antineoplastic chemotherapy (10/24/2016); Goals of care, counseling/discussion (10/24/2016); and Oxygen dependent.  PAST SURGICAL HISTORY: She  has a past surgical history that includes Back surgery; Breast surgery (Right); Abdominal hysterectomy (1990); ir generic historical (10/07/2016); and ir generic historical (10/10/2016).  No Known Allergies  No current facility-administered medications on file prior to encounter.    Current Outpatient Prescriptions on File Prior to Encounter  Medication Sig  . albuterol (PROVENTIL HFA;VENTOLIN HFA) 108 (90 BASE) MCG/ACT inhaler Inhale 2 puffs into the lungs every 6 (six) hours as needed for wheezing or shortness of breath.   Marland Kitchen atorvastatin (LIPITOR) 40 MG tablet  Take 40 mg by mouth every evening.   . bisoprolol (ZEBETA) 10 MG tablet Take 1 tablet (10 mg total) by mouth daily.  . folic acid (FOLVITE) 1 MG tablet Take 1 tablet (1 mg total) by mouth daily.  . furosemide (LASIX) 40 MG tablet Take 1 tablet (40 mg total) by mouth daily.  Marland Kitchen guaiFENesin (MUCINEX) 600 MG 12 hr tablet Take 1 tablet (600 mg total) by mouth 2 (two) times daily.  Marland Kitchen lidocaine-prilocaine (EMLA) cream Apply 1 application topically as needed. (Patient taking differently: Apply 1 application topically daily as needed (port access). )  . morphine (MS CONTIN) 30 MG 12 hr tablet Take 1 tablet (30 mg total) by mouth every 12 (twelve) hours.  Marland Kitchen oxyCODONE-acetaminophen (PERCOCET) 10-325 MG per tablet Take 1 tablet by mouth every 6 (six) hours as needed for pain. (Patient taking differently: Take 1 tablet by mouth 5 (five) times daily as needed for pain. )  . predniSONE (DELTASONE) 5 MG tablet Take 10 mg by mouth every morning.  . pregabalin (LYRICA) 200 MG capsule Take 200 mg by mouth daily.   . SYMBICORT 160-4.5 MCG/ACT inhaler USE 2 PUFFS FIRST THING IN THE MORNING AND 2 PUFFS ABOUT 12 HOURS LATER  . dexamethasone (DECADRON) 4 MG tablet Take 1 tablet (4 mg total) by mouth 2 (two) times daily with a meal. (Patient not taking: Reported on 10/27/2016)  . feeding supplement, ENSURE ENLIVE, (ENSURE ENLIVE) LIQD Take 237 mLs by mouth 2 (two) times daily between meals. (Patient not taking: Reported on 10/23/2016)  . prochlorperazine (COMPAZINE) 10 MG tablet Take 1 tablet (10 mg total) by mouth every 6 (six) hours as needed for nausea or vomiting. (Patient not taking: Reported on 10/05/2016)    FAMILY  HISTORY:  Her indicated that her mother is deceased. She indicated that her father is deceased. She indicated that her sister is alive. She indicated that the status of her neg hx is unknown. She indicated that the status of her other is unknown.    SOCIAL HISTORY: She  reports that she quit smoking  about 2 years ago. Her smoking use included Cigarettes. She has a 117.00 pack-year smoking history. She has never used smokeless tobacco. She reports that she does not drink alcohol or use drugs.  REVIEW OF SYSTEMS:    SUBJECTIVE:  Feels better. Still has some dysnea. More alert today  VITAL SIGNS: BP (!) 159/70 (BP Location: Left Arm)   Pulse 76   Temp 98 F (36.7 C) (Oral)   Resp 18   Ht '5\' 6"'$  (1.676 m)   Wt 219 lb 9.3 oz (99.6 kg)   SpO2 93%   BMI 35.44 kg/m   HEMODYNAMICS:    VENTILATOR SETTINGS: Vent Mode: PCV;BIPAP FiO2 (%):  [40 %-50 %] 40 % Set Rate:  [12 bmp] 12 bmp Vt Set:  [12 mL] 12 mL PEEP:  [6 cmH20] 6 cmH20  INTAKE / OUTPUT: I/O last 3 completed shifts: In: 6202.9 [P.O.:300; I.V.:5202.9; IV Piggyback:700] Out: 1200 [Urine:1200]  PHYSICAL EXAMINATION: General:  No respiratory distress. Neuro:  No focal deficits HEENT: Moist mucous membranes, no thyromegaly, JVD  CardiovascularTachycardia, regular rate and rhythm Lungs: Diffuse bilateral wheezes, no crackles, diminished breath sounds at the base Abdomen: Distended, + BS Musculoskeletal; Normal tone and bulk, Trace edema Skin:  Intact  LABS:  BMET  Recent Labs Lab 10/29/16 0430 10/29/16 0908 10/30/16 0055  NA 143 142 145  K 3.9 3.9 4.0  CL 103 104 108  CO2 '31 30 29  '$ BUN 22* 22* 19  CREATININE 0.85 0.72 0.55  GLUCOSE 89 92 168*    Electrolytes  Recent Labs Lab 10/29/16 0430 10/29/16 0908 10/30/16 0055  CALCIUM 9.4 9.2 9.0    CBC  Recent Labs Lab 10/09/2016 1822 10/29/16 0908 10/30/16 0055  WBC 14.0* 11.2* 7.8  HGB 9.5* 9.5* 9.5*  HCT 31.4* 32.0* 32.0*  PLT 295 298 279    Coag's No results for input(s): APTT, INR in the last 168 hours.  Sepsis Markers  Recent Labs Lab 10/09/2016 2003 10/29/16 0908 10/29/16 1248  LATICACIDVEN 1.1 1.2 1.0  PROCALCITON  --  0.21  --     ABG  Recent Labs Lab 10/29/16 0930 10/30/16 0915  PHART 7.336* 7.435  PCO2ART 58.8* 42.1   PO2ART 59.2* 65.2*    Liver Enzymes  Recent Labs Lab 10/10/2016 1448 10/29/16 0908 10/30/16 0055  AST 41 40 42*  ALT '25 23 25  '$ ALKPHOS 108 94 91  BILITOT 1.1 0.8 0.9  ALBUMIN 2.5* 2.2* 2.3*    Cardiac Enzymes  Recent Labs Lab 10/04/2016 1822 10/24/2016 2249 10/29/16 0430  TROPONINI 0.03* 0.03* 0.03*    Glucose No results for input(s): GLUCAP in the last 168 hours.  Imaging Ct Angio Chest Pe W Or Wo Contrast  Result Date: 10/29/2016 CLINICAL DATA:  COPD with newly diagnosed lung cancer. EXAM: CT ANGIOGRAPHY CHEST WITH CONTRAST TECHNIQUE: Multidetector CT imaging of the chest was performed using the standard protocol during bolus administration of intravenous contrast. Multiplanar CT image reconstructions and MIPs were obtained to evaluate the vascular anatomy. CONTRAST:  90 cc Isovue 370 COMPARISON:  PET-CT 10/22/2016.  Chest CT 10/14/2016. FINDINGS: Cardiovascular: Heart size upper normal. Coronary artery calcification is noted. Atherosclerotic calcification is  noted in the wall of the thoracic aorta.String like small volume pulmonary embolus is identified in the left main pulmonary artery. There is borderline occlusive pulmonary embolus identified and subsegmental right lower lobe pulmonary arteries (see image 68 series 4). No evidence for right heart strain. Mediastinum/Nodes: Stable 8 mm short axis subcarinal lymph node. Stable 14 mm short axis right hilar lymph node. Lungs/Pleura: Loculated right pleural effusion with loculations in the right fissures, similar to prior. 9 mm right middle lobe pulmonary nodule is stable. 18 mm spiculated subpleural anterior right upper lobe pulmonary nodule, seen to be hypermetabolic on recent PET-CT also stable since previous. Fine detail a both lungs obscured by patient breathing motion. Upper Abdomen: Multiple liver lesions are stable, seen to be hypermetabolic on recent PET scan. No change 18 mm left adrenal nodule. Musculoskeletal: Multiple  lytic thoracolumbar spine lesions consistent with metastatic disease, as described previously. Review of the MIP images confirms the above findings. IMPRESSION: 1. Small volume pulmonary embolus, nonocclusive left main pulmonary artery and involving subsegmental pulmonary artery to the right lower lobe. No evidence for right heart strain. 2. Otherwise stable exam with no change in the right pulmonary nodules, mediastinal and right hilar lymphadenopathy and loculated pleural fluid collection/disease. 3. Multiple liver lesions, seen to be hypermetabolic on previous PET imaging. 4. Stable left adrenal nodule 5. Bony metastatic involvement. I discussed these findings by telephone with Dr. Allyson Sabal at 1520 hours on 10/29/2016. Electronically Signed   By: Misty Stanley M.D.   On: 10/29/2016 15:24   Dg Hip Unilat With Pelvis 2-3 Views Left  Result Date: 10/29/2016 CLINICAL DATA:  Hip pain.  Fall. EXAM: DG HIP (WITH OR WITHOUT PELVIS) 2-3V LEFT COMPARISON:  05/07/2011 . FINDINGS: Diffuse osteopenia degenerative change. No evidence of fracture or dislocation. Surgical clips in the pelvis. Contrast from prior CT noticed in the bladder. IMPRESSION: Degenerative changes lumbar spine and both hips. No acute abnormality identified. No evidence of fracture or dislocation. Electronically Signed   By: Marcello Moores  Register   On: 10/29/2016 15:37    STUDIES:  CXR 12/26 > bibasal consolidation. Right greater than left effusion  CTA 12/26 > Small B/L PE with no right heart strain, lung masses and effusions are unchanged All images reviewed  CULTURES: Bcx X 2 12/26:  ANTIBIOTICS: Vancomycin 12/26 > Cefepime 12/26 >  SIGNIFICANT EVENTS:  LINES/TUBES:  DISCUSSION: 38 year with newly diagnosed stage IV adenocarcinoma of the lung with COPD. Now with acute hypercarbic respiratory failure with CTA showing PE Concern for HCAP, worsening malignant effusion but no evidence on CT and normal AL and Pct  ASSESSMENT /  PLAN: Stage IV lung cancer COPD Hypercarbic resp failiure with AECOP, PE HCAP unlikely with normal Pct and LA Rheumatoid arthritis (on chronic pred 10 mg)  - Use Bipap PRN - Heparin anticoagulation - Pulmicort nebs, duonebs - IV solumedrol - On vanco, cefepime. Can D/C if cultures are negative  FAMILY  - Updates: No family at bedside. - Inter-disciplinary family meet or Palliative Care meeting due by:  11/05/16  Marshell Garfinkel MD [facility] Pager [phone] If no answer or after 3pm call: [phone] 10/30/2016, 11:00 AM

## 2016-10-30 NOTE — Clinical Social Work Note (Signed)
Clinical Social Work Assessment  Patient Details  Name: [name] MRN: [id number] Date of Birth: 1945-12-30  Date of referral:  10/30/16               Reason for consult:  Facility Placement                Permission sought to share information with:  Facility Sport and exercise psychologist, Family Supports Permission granted to share information::     Name::      Lucila Maine  Agency::   SNF  Relationship::   Significant other, and sister  Contact Information:   [phone], [phone]  Housing/Transportation Living arrangements for the past 2 months:  Apartment Source of Information:  Patient Patient Interpreter Needed:  None Criminal Activity/Legal Involvement Pertinent to Current Situation/Hospitalization:  No - Comment as needed Significant Relationships:  Siblings Lives with:  Siblings (Lives with sister ) Do you feel safe going back to the place where you live?  Yes Need for family participation in patient care:  Yes (Comment)  Care giving concerns: Patient may need SNF care at discharge.    Social Worker assessment / plan:  LCSWA met with patient, sister and family at bedside. LCSWA explained reason for consult. The patient reports she is open tot SNF at this time. She feels she made a mistake before by not going to SNF. LCSWA discussed and expained SNF process. Patient is agreeable. Plan: assist with dispostion, complete FL2, and PASRR.   Employment status:  Retired Passenger transport manager) PT Recommendations:  Not assessed at this time [place] / Referral to community resources:  [place]  Patient/Family's Response to care:  Patient responding well to care.   Patient/Family's Understanding of and Emotional Response to Diagnosis, Current Treatment, and Prognosis:  Patient understands she need treatment and may need psychical therapy at rehab center going home.   Emotional Assessment Appearance:  Appears  stated age Attitude/Demeanor/Rapport:    Affect (typically observed):  Accepting Orientation:  Oriented to Self, Oriented to Place, Oriented to  Time, Oriented to Situation Alcohol / Substance use:  Not Applicable Psych involvement (Current and /or in the community):  No (Comment)  Discharge Needs  Concerns to be addressed:    Readmission within the last 30 days:    Current discharge risk:  Dependent with Mobility Barriers to Discharge:  Continued Medical Work up   [employer], LCSW 10/30/2016, 2:31 PM

## 2016-10-30 NOTE — Progress Notes (Signed)
[facility] PROGRESS NOTE  Delorese Sellin NGE:[id number] DOB: May 25, 1946 DOA: 10/24/2016   PCP: Merrilee Seashore, MD     Assessment/Plan: Principal Problem:   Weakness Active Problems:   COPD GOLD II   Essential hypertension   Hypokalemia   Goals of care, counseling/discussion   Back pain   Non-small cell carcinoma of lung ([place])   Palliative care by specialist   Malignant neoplasm of lung ([place])   Acute respiratory failure ([place])    70 y.o. 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  Patient states that she fell on the way to the bathroom.  Patients roommate and her sisters daughter attempted to get her up but were unsuccessful. Patient evaluated by ED physician.  Potassium slightly low at 3.2, Troponin slightly elevated, WBC increased at 14.6.  Patient is tachypneic, tachycardic, afebrile, lactic acid was not ordered.  TRH was asked to admit for dyspnea, and generalized weakness. Patient was scheduled to start post chemotherapy 12/26  Assessment and plan Sepsis likely secondary to pneumonia Meets sepsis criteria Tachycardic tachypneic, febrile Focused sepsis protocol initiated-lactic acid 1.2, Pro calcitonin 0.21,  patient on broad-spectrum antibiotics, day #2,Can D/C if cultures are negative Notified oncology-Dr. Julien Nordmann Initial chest x-ray did not show any significant changes, suspected lymphangitis spread CT scan 12/12 showed multiple small pockets of loculated pleural fluid within the right hemithorax/pulmonary nodules right upper and right middle lobe. CT PE protocol showed subsegmental PE Due to tachypnea patient placed on BiPAP Discussed CODE STATUS would like to be a full code Appreciate critical care input ABG noted-improving Patient was on 5 L of nasal cannula this morning,,  however closer to known : BiPAP  ACUTE PE Nonocclusive Start heparin gtt, need to make a decision about Lovenox vs NOAC at DC   Weakness likely secondary to above -Hold off on PT/OT consult - SW consult as patient will likely not be able to return back to previous apartment - progressively worsening Discontinue sedating medications  Anorexia - nutrition consult placed  Non- small cell carcinoma of the lung - supposed to go for radiation mapping , prohibited because of pulmonary status - patient postponed port placement due to weakness this past week - per notes from Dr. Julien Nordmann of oncology chemotherapy is palliative only, notified Dr. Julien Nordmann that patient is being moved to stepdown, and would like to be a full code - palliative care consulted  Hypokalemia Repleted - will repeat BMP in am     Back Pain - secondary to metastases Hold MS Contin and Oxycodone given somnolence - will add PRN morphine and diluadid for breakthru pain      DVT prophylaxsis heparin gtt  Code Status:  Full code    Family Communication: Discussed in detail with the patient/neice, all imaging results, lab results explained to the patient   Disposition Plan:  Continue stepdown      Consultants:  Called Dr. Julien Nordmann 12/26  Critical care  Procedures:  None  Antibiotics: Anti-infectives    Start     Dose/Rate Route Frequency Ordered Stop   10/29/16 2200  vancomycin (VANCOCIN) IVPB 750 mg/150 ml premix     750 mg 150 mL/hr over 60 Minutes Intravenous Every 12 hours 10/29/16 0825     10/29/16 1700  ceFEPIme (MAXIPIME) 1 g in dextrose 5 %  50 mL IVPB     1 g 100 mL/hr over 30 Minutes Intravenous Every 8 hours 10/29/16 0825     10/29/16 1000  vancomycin (VANCOCIN) 1,500 mg in sodium chloride 0.9 % 500 mL IVPB     1,500 mg 250 mL/hr over 120 Minutes Intravenous  Once 10/29/16 0825 10/29/16 1218   10/29/16 0830  ceFEPIme (MAXIPIME) 1 g in dextrose 5 % 50 mL IVPB     1 g 100  mL/hr over 30 Minutes Intravenous NOW 10/29/16 0825 10/29/16 1003         HPI/Subjective: Patient placed back on BiPAP this morning due to respiratory distress,    Objective: Vitals:   10/30/16 0916 10/30/16 0958 10/30/16 0959 10/30/16 1135  BP:      Pulse:      Resp:      Temp:      TempSrc:      SpO2: 93% 93% 93% 98%  Weight:      Height:        Intake/Output Summary (Last 24 hours) at 10/30/16 1146 Last data filed at 10/30/16 1100  Gross per 24 hour  Intake          4411.17 ml  Output              800 ml  Net          3611.17 ml    Exam:  Examination:  General exam: Appears calm and comfortable  Respiratory system: Clear to auscultation. Respiratory effort normal. Cardiovascular system: S1 & S2 heard, RRR. No JVD, murmurs, rubs, gallops or clicks. No pedal edema. Gastrointestinal system: Abdomen is nondistended, soft and nontender. No organomegaly or masses felt. Normal bowel sounds heard. Central nervous system: Alert and oriented. No focal neurological deficits. Extremities: Symmetric 5 x 5 power. Skin: No rashes, lesions or ulcers Psychiatry: Judgement and insight appear normal. Mood & affect appropriate.     Data Reviewed: I have personally reviewed following labs and imaging studies  Micro Results Recent Results (from the past 240 hour(s))  Urine culture     Status: Abnormal   Collection Time: 10/11/2016  4:29 PM  Result Value Ref Range Status   Specimen Description URINE, RANDOM  Final   Special Requests NONE  Final   Culture (A)  Final    <10,000 COLONIES/mL INSIGNIFICANT GROWTH Performed at [facility]    Report Status 10/29/2016 FINAL  Final    Radiology Reports Dg Chest 1 View  Result Date: 10/01/2016 CLINICAL DATA:  Post right thoracentesis EXAM: CHEST 1 VIEW COMPARISON:  09/28/2016 FINDINGS: Decreasing right pleural effusion following thoracentesis. No pneumothorax. Small right effusion and right base atelectasis persists. Heart  is borderline in size. Left lung is clear. IMPRESSION: No pneumothorax following right thoracentesis. Small residual right effusion with right base atelectasis. Electronically Signed   By: Rolm Baptise M.D.   On: 10/01/2016 14:33   Dg Chest 2 View  Result Date: 10/25/2016 CLINICAL DATA:  Shortness of breath.  History COPD.  Former smoker. EXAM: CHEST  2 VIEW COMPARISON:  PET-CT 10/22/2016 FINDINGS: Cardiomediastinal silhouette is normal. Mediastinal contours appear intact. Calcific atherosclerotic disease of the aorta seen. There is no evidence of pneumothorax. Nodular densities overlying the right hemithorax likely represent loculated pleural effusion. Malignant appearing nodules demonstrated by recent PET-CT in the right upper and right middle lobe are poorly visualized. Mild interstitial thickening on the right. Osseous structures are without acute abnormality. Soft tissues are grossly normal. IMPRESSION: No significant  change in findings suggestive of right lung malignancy with potential lymphangitic spread of disease. Electronically Signed   By: Fidela Salisbury M.D.   On: 10/11/2016 15:04   Dg Chest 2 View  Result Date: 10/16/2016 CLINICAL DATA:  Follow-up pleural effusion . EXAM: CHEST  2 VIEW COMPARISON:  CT 10/14/2016.  Chest x-ray 10/14/2016 09/25/2016. FINDINGS: Mediastinum and hilar structures are normal. Heart size normal. Right lower lobe atelectasis and infiltrate. Small right pleural effusion. No acute bony abnormality identified. IMPRESSION: Right lower lobe atelectasis and infiltrate again noted. With small right pleural effusion noted. Similar findings noted on prior exam. No pneumothorax. Electronically Signed   By: Marcello Moores  Register   On: 10/16/2016 10:32   Dg Chest 2 View  Result Date: 10/14/2016 CLINICAL DATA:  Left-sided breast pain and shortness of breath. History of lung cancer. EXAM: CHEST  2 VIEW COMPARISON:  Chest radiograph 10/07/2016 and chest CT 09/24/2016 FINDINGS:  Cardiomediastinal contours are unchanged. Small right pleural effusion with loculated component along the right lateral hemithorax is slightly decreased in size. Multiple nodular opacities overlying the right lung are unchanged. There is atherosclerotic calcification within the aortic arch. No pneumothorax, focal airspace consolidation or pulmonary edema. IMPRESSION: 1. No acute cardiopulmonary disease. 2. Decreased size of partially loculated right pleural effusion and unchanged multifocal nodular opacities of the right hemithorax. 3. Aortic atherosclerosis. Electronically Signed   By: Ulyses Jarred M.D.   On: 10/14/2016 14:12   Dg Chest 2 View  Result Date: 10/07/2016 CLINICAL DATA:  Recurrent right pleural and fusion associated with shortness of breath and chest pain. History of COPD, former smoker, diabetes. Patient also has a history of right-sided breast malignancy. EXAM: CHEST  2 VIEW COMPARISON:  Chest x-rays of October 04, 2016, September 24, 2016, and CT scan of the chest of September 24, 2016. FINDINGS: The left lung is mildly hyperinflated. There is no left-sided pleural effusion. On the right there is a small pleural effusion which has increased slightly in size over the past 3 days. There is no pneumothorax. There is stable ovoid density projecting over the posterior aspect of the right seventh rib and patchy interstitial density more inferiorly. Is calcification in the wall of the aortic arch. The heart and pulmonary vascularity are normal. The bony thorax exhibits no acute abnormality. The heart and pulmonary vascularity are normal. The mediastinum is normal in width. IMPRESSION: Slight interval increase in the volume of the small right pleural effusion. Stable underlying chronic bronchitic changes. Patchy density in the right mid lung is stable. Thoracic aortic atherosclerosis. Electronically Signed   By: David  Martinique M.D.   On: 10/07/2016 09:41   Dg Chest 2 View  Result Date:  10/04/2016 CLINICAL DATA:  Shortness of breath.  Ex-smoker. EXAM: CHEST  2 VIEW COMPARISON:  10/01/2016. FINDINGS: Increasing RIGHT pleural effusion. Compressive atelectasis at the RIGHT base but no definite consolidation. Trace LEFT pleural effusion. No pneumothorax. Normal heart size. IMPRESSION: Beginning re-accumulation of RIGHT pleural effusion post thoracentesis. Electronically Signed   By: Staci Righter M.D.   On: 10/04/2016 10:28   Ct Angio Chest Pe W Or Wo Contrast  Result Date: 10/29/2016 CLINICAL DATA:  COPD with newly diagnosed lung cancer. EXAM: CT ANGIOGRAPHY CHEST WITH CONTRAST TECHNIQUE: Multidetector CT imaging of the chest was performed using the standard protocol during bolus administration of intravenous contrast. Multiplanar CT image reconstructions and MIPs were obtained to evaluate the vascular anatomy. CONTRAST:  90 cc Isovue 370 COMPARISON:  PET-CT 10/22/2016.  Chest CT  10/14/2016. FINDINGS: Cardiovascular: Heart size upper normal. Coronary artery calcification is noted. Atherosclerotic calcification is noted in the wall of the thoracic aorta.String like small volume pulmonary embolus is identified in the left main pulmonary artery. There is borderline occlusive pulmonary embolus identified and subsegmental right lower lobe pulmonary arteries (see image 68 series 4). No evidence for right heart strain. Mediastinum/Nodes: Stable 8 mm short axis subcarinal lymph node. Stable 14 mm short axis right hilar lymph node. Lungs/Pleura: Loculated right pleural effusion with loculations in the right fissures, similar to prior. 9 mm right middle lobe pulmonary nodule is stable. 18 mm spiculated subpleural anterior right upper lobe pulmonary nodule, seen to be hypermetabolic on recent PET-CT also stable since previous. Fine detail a both lungs obscured by patient breathing motion. Upper Abdomen: Multiple liver lesions are stable, seen to be hypermetabolic on recent PET scan. No change 18 mm left  adrenal nodule. Musculoskeletal: Multiple lytic thoracolumbar spine lesions consistent with metastatic disease, as described previously. Review of the MIP images confirms the above findings. IMPRESSION: 1. Small volume pulmonary embolus, nonocclusive left main pulmonary artery and involving subsegmental pulmonary artery to the right lower lobe. No evidence for right heart strain. 2. Otherwise stable exam with no change in the right pulmonary nodules, mediastinal and right hilar lymphadenopathy and loculated pleural fluid collection/disease. 3. Multiple liver lesions, seen to be hypermetabolic on previous PET imaging. 4. Stable left adrenal nodule 5. Bony metastatic involvement. I discussed these findings by telephone with Dr. Allyson Sabal at 1520 hours on 10/29/2016. Electronically Signed   By: Misty Stanley M.D.   On: 10/29/2016 15:24   Ct Angio Chest Pe W Or Wo Contrast  Result Date: 10/14/2016 CLINICAL DATA:  Short of breath. Pleural drainage catheter removed 1 week prior. EXAM: CT ANGIOGRAPHY CHEST WITH CONTRAST TECHNIQUE: Multidetector CT imaging of the chest was performed using the standard protocol during bolus administration of intravenous contrast. Multiplanar CT image reconstructions and MIPs were obtained to evaluate the vascular anatomy. CONTRAST:  70 mL Isovue COMPARISON:  CT 09/24/2016 FINDINGS: Cardiovascular: Coronary artery calcification and aortic atherosclerotic calcification. No pericardial fluid. Mediastinum/Nodes: No axillary or supraclavicular adenopathy. No mediastinal adenopathy. Lungs/Pleura: There are multiple small pockets of loculated pleural fluid along the posterior aspect of the RIGHT hemithorax. Interval reduction of moderate dependent effusion effusion seen on comparison exam. Some fluid extends along the fissure. No pneumothorax. Rounded nodule in the anterior RIGHT middle lobe measuring 9 mm (image 63, series 11. Nodule in the anterior RIGHT upper lobe additionally measuring 16 mm  on image 44, series. These 2 pulmonary nodules are not changed comparison exam. LEFT lung clear Upper Abdomen: Limited view of the liver, kidneys, pancreas are unremarkable. Normal adrenal glands. Musculoskeletal: No aggressive osseous lesion. Review of the MIP images confirms the above findings. IMPRESSION: 1. Multiple small pockets of loculated pleural fluid within the RIGHT hemithorax replaces the previous seen moderate pleural effusion. 2. Stable pulmonary nodules the RIGHT upper lobe and RIGHT middle lobe of undetermined etiology. 3. No pneumothorax Electronically Signed   By: Suzy Bouchard M.D.   On: 10/14/2016 17:00   Mr Jeri Cos VP Contrast  Result Date: 10/04/2016 CLINICAL DATA:  New diagnosis of lung cancer. History of breast cancer, hypertension rheumatoid arthritis. Assess for intracranial metastasis. EXAM: MRI HEAD WITHOUT AND WITH CONTRAST TECHNIQUE: Multiplanar, multiecho pulse sequences of the brain and surrounding structures were obtained without and with intravenous contrast. CONTRAST:  81m MULTIHANCE GADOBENATE DIMEGLUMINE 529 MG/ML IV SOLN COMPARISON:  None. FINDINGS:  Mild motion degraded examination. INTRACRANIAL CONTENTS: No reduced diffusion to suggest acute ischemia or hypercellular tumor. No susceptibility artifact to suggest hemorrhage. The ventricles and sulci are normal for patient's age. Patchy supratentorial white matter FLAIR T2 hyperintensities are less than expected for age, compatible with chronic small vessel ischemic disease. No suspicious parenchymal signal, masses, mass effect. No abnormal intraparenchymal or extra-axial enhancement. No abnormal extra-axial fluid collections. No extra-axial masses. VASCULAR: Normal major intracranial vascular flow voids present at skull base. SKULL AND UPPER CERVICAL SPINE: No abnormal sellar expansion. No suspicious calvarial bone marrow signal. Craniocervical junction maintained. Small RIGHT exostosis. SINUSES/ORBITS: RIGHT maxillary  mucosal retention cyst. Small RIGHT mastoid effusion.The included ocular globes and orbital contents are non-suspicious. OTHER: None. IMPRESSION: No intracranial metastasis. Negative motion degraded MRI head with and without contrast for age. Electronically Signed   By: Elon Alas M.D.   On: 10/04/2016 18:35   Ir Guided Niel Hummer W Catheter Placement  Result Date: 10/07/2016 CLINICAL DATA:  History of lung cancer, now with recurrent symptomatic right-sided malignant effusion. Please perform tunneled right-sided pleural drainage catheter for palliative purposes. EXAM: INSERTION OF TUNNELED RIGHT SIDED PLEURAL DRAINAGE CATHETER COMPARISON:  Ultrasound-guided thoracentesis - 10/01/2016; 09/25/2016; chest radiograph - 10/07/2016 MEDICATIONS: Ancef 2 gm IV; Antibiotic was administered in an appropriate time interval for the procedure. ANESTHESIA/SEDATION: Moderate (conscious) sedation was employed during this procedure. A total of Versed 2 mg and Fentanyl 50 mcg was administered intravenously. Moderate Sedation Time: 14 minutes. The patient's level of consciousness and vital signs were monitored continuously by radiology nursing throughout the procedure under my direct supervision. FLUOROSCOPY TIME:  FLUOROSCOPY TIME 36 seconds (19.5 mGy) COMPLICATIONS: None immediate. PROCEDURE: The procedure, risks, benefits, and alternatives were explained to the patient, who wish to proceed with the placement of this permanent pleural catheter as the patient is seeking palliative care. The patient understand and consent to the procedure. The right inferior lateral chest and upper abdomen were prepped with Chlorhexidine in a sterile fashion, and a sterile drape was applied covering the operative field. A sterile gown and sterile gloves were used for the procedure. Initial ultrasound scanning and fluoroscopic imaging demonstrates a recurrent moderate to large pleural effusion. Under direct ultrasound guidance, the right inferior  lateral pleural space was accessed with a Yueh sheath needle after the overlying soft tissues were anesthetized with 1% lidocaine with epinephrine. An Amplatz super stiff wire was then advanced under fluoroscopy into the pleural space. A 15.5 French tunneled Pleur-X catheter was tunneled from an incision within the right upper abdominal quadrant to the access site. The pleural access site was serially dilated under fluoroscopy, ultimately allowing placement of a peel-away sheath. The catheter was advanced through the peel-away sheath. The sheath was then removed. Final catheter positioning was confirmed with a fluoroscopic radiographic image. The access incision was closed with subcutaneous subcuticular 4-0 Vicryl, Dermabond and Steri-Strips. A Prolene retention suture was applied at the catheter exit site. Large volume thoracentesis was performed through the new catheter utilizing provided bulb vacuum assisted drainage bag. The patient tolerated the above procedure well without immediate postprocedural complication. FINDINGS: Preprocedural ultrasound scanning demonstrates a recurrent moderate sized right sided pleural effusion. After ultrasound and fluoroscopic guided placement, the catheter is coiled within the caudal aspect of the right hemi thorax at the location of the patient's recurrent malignant effusion. Following catheter placement, approximately 1.3 L of serous though slightly blood tinged pleural fluid was removed. IMPRESSION: Successful placement of permanent, tunneled right pleural drainage catheter via lateral  approach. Approximately 1.3 L of serous though slightly blood tinged pleural fluid was removed after catheter placement. Electronically Signed   By: Sandi Mariscal M.D.   On: 10/07/2016 16:42   Nm Pet Image Initial (pi) Skull Base To Thigh  Result Date: 10/22/2016 CLINICAL DATA:  Initial treatment strategy for right lung adenocarcinoma. EXAM: NUCLEAR MEDICINE PET SKULL BASE TO THIGH TECHNIQUE:  11.4 mCi F-18 FDG was injected intravenously. Full-ring PET imaging was performed from the skull base to thigh after the radiotracer. CT data was obtained and used for attenuation correction and anatomic localization. FASTING BLOOD GLUCOSE:  Value: 160 mg/dl COMPARISON:  Chest CTA on 10/14/2016 FINDINGS: NECK No hypermetabolic lymph nodes in the neck. CHEST Small multiloculated right pleural effusion shows diffuse hypermetabolic activity, consistent with malignant pleural effusion. A spiculated nodule in the anterior right upper lobe abutting the pleural surface on image 41/9 is hypermetabolic, with SUV max of 12.2. A 10 mm smoothly marginated pulmonary nodule in the right middle lobe on image 48/7 shows mild hypermetabolism with SUV max of 4.4. No suspicious pulmonary nodule seen in the left lung. 8 mm subcarinal lymph node on image 37/9 is hypermetabolic with SUV max of 8.3. Hypermetabolic activity also seen in right hilum with SUV max of 12.2 ABDOMEN/PELVIS Multiple small hypermetabolic lesions are seen throughout the right and left hepatic lobes, consistent with diffuse liver metastases. 1.8 cm left adrenal mass is hypermetabolic with SUV max of 02.4, consistent with left adrenal metastasis. No other hypermetabolic masses or lymphadenopathy identified within the abdomen or pelvis. Colonic diverticulosis is noted, without evidence of diverticulitis. Previous hysterectomy. SKELETON Diffuse hypermetabolic lytic bone metastases are seen throughout the spine, bilateral ribs, bilateral scapulae and right humeral head, pelvis, and bilateral hips, consistent with diffuse bone metastases. Several pathologic right rib fractures are noted. IMPRESSION: 16 mm hypermetabolic spiculated nodule in the anterior right upper lobe, and 10 mm hypermetabolic smoothly marginated nodule in right middle lobe, which could represent primary bronchogenic carcinoma or pulmonary metastases. Small malignant multiloculated right pleural  effusion. Small hypermetabolic right hilar and subcarinal mediastinal lymph nodes, consistent with metastatic disease. Diffuse liver metastases. Small left adrenal metastasis. Diffuse lytic bone metastases. Several pathologic right rib fractures also noted. Electronically Signed   By: Earle Gell M.D.   On: 10/22/2016 11:53   Dg Chest Port 1 View  Result Date: 10/29/2016 CLINICAL DATA:  Fever.  Unresponsive patient. EXAM: PORTABLE CHEST 1 VIEW COMPARISON:  Chest x-ray 10/04/2016.  Chest CT 10/14/2016 FINDINGS: 0813 hours. The cardio pericardial silhouette is enlarged. Bibasilar atelectasis/ infiltrate. Right pleural effusion with apparent loculation. The visualized bony structures of the thorax are intact. Telemetry leads overlie the chest. IMPRESSION: Cardiomegaly with bibasilar collapse/ consolidation and right greater than left pleural effusion, likely loculated. Electronically Signed   By: Misty Stanley M.D.   On: 10/29/2016 08:42   Dg Hip Unilat With Pelvis 2-3 Views Left  Result Date: 10/29/2016 CLINICAL DATA:  Hip pain.  Fall. EXAM: DG HIP (WITH OR WITHOUT PELVIS) 2-3V LEFT COMPARISON:  05/07/2011 . FINDINGS: Diffuse osteopenia degenerative change. No evidence of fracture or dislocation. Surgical clips in the pelvis. Contrast from prior CT noticed in the bladder. IMPRESSION: Degenerative changes lumbar spine and both hips. No acute abnormality identified. No evidence of fracture or dislocation. Electronically Signed   By: Marcello Moores  Register   On: 10/29/2016 15:37   Ir Removal Of Plural Cath W/cuff  Result Date: 10/10/2016 INDICATION: 69 year old female with a history of malignant right-sided pleural effusion  and PleurX catheter placed 10/07/2016. She presents today for malfunctioning catheter. EXAM: IR REMOVAL OF PLEURAL CATH WITH CUFF MEDICATIONS: The patient is currently admitted to the hospital and receiving intravenous antibiotics. The antibiotics were administered within an appropriate time  frame prior to the initiation of the procedure. ANESTHESIA/SEDATION: None COMPLICATIONS: None PROCEDURE: Informed written consent was obtained from the patient after a thorough discussion of the procedural risks, benefits and alternatives. All questions were addressed. Maximal Sterile Barrier Technique was utilized including caps, mask, sterile gowns, sterile gloves, sterile drape, hand hygiene and skin antiseptic. A timeout was performed prior to the initiation of the procedure. Patient was positioned supine position on the fluoroscopy table. 1% lidocaine was used for local anesthesia. Spot images of the chest were performed. Contrast injection was performed of the catheter. Glidewire was advanced through the catheter in attempt to reposition. Retention suture was ligated in order to Dermabond the skin. Catheter was damaged during the manipulation requiring attempt at exchange. Stiff wire was advanced through the catheter which was removed from the stiff wire. Combination of the peel-away sheath, Kumpe catheter were used in order to stiff in the tract an allow passage of the new catheter over the wiring catheter combination. Exchange of the catheter failed, with inability to passed through the soft tissue tract and enter the pleura. Catheter was removed and dressing was placed. Patient tolerated the procedure well and remained hemodynamically stable throughout. No complications were encountered and no significant blood loss encountered. FINDINGS: Initial image demonstrates catheter coiled at the base of the lung, potentially subpulmonic. Contrast injection demonstrates contrast accumulating within the pleural is a space. Wire manipulation failed to manipulate the catheter to more superior position. Images demonstrate failed catheter exchange given the damaged catheter. IMPRESSION: Status post failed attempt at exchange of a tunneled right chest PleurX catheter. Signed, Dulcy Fanny. Earleen Newport, DO [facility] Radiology PLAN: The patient will return for a scheduled visit at the [facility] with the chest x-ray to determine need for new PleurX catheter placement versus potential repeat thoracentesis preceding her oncology plan. Electronically Signed   By: Corrie Mckusick D.O.   On: 10/10/2016 16:52   US Thoracentesis Asp Pleural Space W/img Guide  Result Date: 10/01/2016 INDICATION: Recurrent right pleural effusion of unknown etiology. Request for diagnostic and therapeutic thoracentesis. EXAM: ULTRASOUND GUIDED DIAGNOSTIC AND THERAPEUTIC THORACENTESIS MEDICATIONS: 1% Lidocaine COMPLICATIONS: None immediate. PROCEDURE: An ultrasound guided thoracentesis was thoroughly discussed with the patient and questions answered. The benefits, risks, alternatives and complications were also discussed. The patient understands and wishes to proceed with the procedure. Written consent was obtained. Ultrasound was performed to localize and mark an adequate pocket of fluid in the right chest. The area was then prepped and draped in the normal sterile fashion. 1% Lidocaine was used for local anesthesia. Under ultrasound guidance a Safe-T-Centesis catheter was introduced. Thoracentesis was performed. The catheter was removed and a dressing applied. FINDINGS: A total of approximately 1.2L of serosanguineous fluid was removed. Samples were sent to the laboratory as requested by the clinical team. IMPRESSION: Successful ultrasound guided right thoracentesis yielding 1.2L of pleural fluid. Read by: Saverio Danker, PA-C Electronically Signed   By: Lucrezia Europe M.D.   On: 10/01/2016 14:31     CBC  Recent Labs Lab 10/24/16 1455 10/10/2016 1448 10/05/2016 1822 10/29/16 0908 10/30/16 0055  WBC 11.6* 14.6* 14.0* 11.2* 7.8  HGB 10.5* 10.6* 9.5* 9.5* 9.5*  HCT 34.2* 34.4* 31.4* 32.0* 32.0*  PLT  302 302 295 298 279  MCV 86.4 86.0 87.0 89.6 88.4  MCH 26.5 26.5 26.3 26.6 26.2  MCHC 30.7* 30.8  30.3 29.7* 29.7*  RDW 16.1* 16.7* 17.0* 17.5* 17.1*  LYMPHSABS 1.2 2.0  --   --   --   MONOABS 0.8 0.8  --   --   --   EOSABS 0.0 0.0  --   --   --   BASOSABS 0.0 0.0  --   --   --     Chemistries   Recent Labs Lab 10/24/16 1455 10/17/2016 1448 10/30/2016 1822 10/29/16 0430 10/29/16 0908 10/30/16 0055  NA 142 141  --  143 142 145  K 3.3* 3.2*  --  3.9 3.9 4.0  CL  --  96*  --  103 104 108  CO2 31* 32  --  '31 30 29  '$ GLUCOSE 137 99  --  89 92 168*  BUN 19.3 27*  --  22* 22* 19  CREATININE 0.7 0.68 0.64 0.85 0.72 0.55  CALCIUM 10.1 9.9  --  9.4 9.2 9.0  AST 33 41  --   --  40 42*  ALT 25 25  --   --  23 25  ALKPHOS 120 108  --   --  94 91  BILITOT 0.73 1.1  --   --  0.8 0.9   ------------------------------------------------------------------------------------------------------------------ estimated creatinine clearance is 75.7 mL/min (by C-G formula based on SCr of 0.55 mg/dL). ------------------------------------------------------------------------------------------------------------------ No results for input(s): HGBA1C in the last 72 hours. ------------------------------------------------------------------------------------------------------------------ No results for input(s): CHOL, HDL, LDLCALC, TRIG, CHOLHDL, LDLDIRECT in the last 72 hours. ------------------------------------------------------------------------------------------------------------------ No results for input(s): TSH, T4TOTAL, T3FREE, THYROIDAB in the last 72 hours.  Invalid input(s): FREET3 ------------------------------------------------------------------------------------------------------------------ No results for input(s): VITAMINB12, FOLATE, FERRITIN, TIBC, IRON, RETICCTPCT in the last 72 hours.  Coagulation profile No results for input(s): INR, PROTIME in the last 168 hours.  No results for input(s): DDIMER in the last 72 hours.  Cardiac Enzymes  Recent Labs Lab 10/24/2016 1822 10/10/2016 2249  10/29/16 0430  TROPONINI 0.03* 0.03* 0.03*   ------------------------------------------------------------------------------------------------------------------ Invalid input(s): POCBNP   CBG: No results for input(s): GLUCAP in the last 168 hours.     Studies: Dg Chest 2 View  Result Date: 10/11/2016 CLINICAL DATA:  Shortness of breath.  History COPD.  Former smoker. EXAM: CHEST  2 VIEW COMPARISON:  PET-CT 10/22/2016 FINDINGS: Cardiomediastinal silhouette is normal. Mediastinal contours appear intact. Calcific atherosclerotic disease of the aorta seen. There is no evidence of pneumothorax. Nodular densities overlying the right hemithorax likely represent loculated pleural effusion. Malignant appearing nodules demonstrated by recent PET-CT in the right upper and right middle lobe are poorly visualized. Mild interstitial thickening on the right. Osseous structures are without acute abnormality. Soft tissues are grossly normal. IMPRESSION: No significant change in findings suggestive of right lung malignancy with potential lymphangitic spread of disease. Electronically Signed   By: Fidela Salisbury M.D.   On: 10/06/2016 15:04   Ct Angio Chest Pe W Or Wo Contrast  Result Date: 10/29/2016 CLINICAL DATA:  COPD with newly diagnosed lung cancer. EXAM: CT ANGIOGRAPHY CHEST WITH CONTRAST TECHNIQUE: Multidetector CT imaging of the chest was performed using the standard protocol during bolus administration of intravenous contrast. Multiplanar CT image reconstructions and MIPs were obtained to evaluate the vascular anatomy. CONTRAST:  90 cc Isovue 370 COMPARISON:  PET-CT 10/22/2016.  Chest CT 10/14/2016. FINDINGS: Cardiovascular: Heart size upper normal. Coronary artery calcification is noted. Atherosclerotic calcification is noted  in the wall of the thoracic aorta.String like small volume pulmonary embolus is identified in the left main pulmonary artery. There is borderline occlusive pulmonary embolus  identified and subsegmental right lower lobe pulmonary arteries (see image 68 series 4). No evidence for right heart strain. Mediastinum/Nodes: Stable 8 mm short axis subcarinal lymph node. Stable 14 mm short axis right hilar lymph node. Lungs/Pleura: Loculated right pleural effusion with loculations in the right fissures, similar to prior. 9 mm right middle lobe pulmonary nodule is stable. 18 mm spiculated subpleural anterior right upper lobe pulmonary nodule, seen to be hypermetabolic on recent PET-CT also stable since previous. Fine detail a both lungs obscured by patient breathing motion. Upper Abdomen: Multiple liver lesions are stable, seen to be hypermetabolic on recent PET scan. No change 18 mm left adrenal nodule. Musculoskeletal: Multiple lytic thoracolumbar spine lesions consistent with metastatic disease, as described previously. Review of the MIP images confirms the above findings. IMPRESSION: 1. Small volume pulmonary embolus, nonocclusive left main pulmonary artery and involving subsegmental pulmonary artery to the right lower lobe. No evidence for right heart strain. 2. Otherwise stable exam with no change in the right pulmonary nodules, mediastinal and right hilar lymphadenopathy and loculated pleural fluid collection/disease. 3. Multiple liver lesions, seen to be hypermetabolic on previous PET imaging. 4. Stable left adrenal nodule 5. Bony metastatic involvement. I discussed these findings by telephone with Dr. Allyson Sabal at 1520 hours on 10/29/2016. Electronically Signed   By: Misty Stanley M.D.   On: 10/29/2016 15:24   Dg Chest Port 1 View  Result Date: 10/29/2016 CLINICAL DATA:  Fever.  Unresponsive patient. EXAM: PORTABLE CHEST 1 VIEW COMPARISON:  Chest x-ray 10/13/2016.  Chest CT 10/14/2016 FINDINGS: 0813 hours. The cardio pericardial silhouette is enlarged. Bibasilar atelectasis/ infiltrate. Right pleural effusion with apparent loculation. The visualized bony structures of the thorax are  intact. Telemetry leads overlie the chest. IMPRESSION: Cardiomegaly with bibasilar collapse/ consolidation and right greater than left pleural effusion, likely loculated. Electronically Signed   By: Misty Stanley M.D.   On: 10/29/2016 08:42   Dg Hip Unilat With Pelvis 2-3 Views Left  Result Date: 10/29/2016 CLINICAL DATA:  Hip pain.  Fall. EXAM: DG HIP (WITH OR WITHOUT PELVIS) 2-3V LEFT COMPARISON:  05/07/2011 . FINDINGS: Diffuse osteopenia degenerative change. No evidence of fracture or dislocation. Surgical clips in the pelvis. Contrast from prior CT noticed in the bladder. IMPRESSION: Degenerative changes lumbar spine and both hips. No acute abnormality identified. No evidence of fracture or dislocation. Electronically Signed   By: Marcello Moores  Register   On: 10/29/2016 15:37      Lab Results  Component Value Date   HGBA1C 6.4 (H) 12/03/2013   Lab Results  Component Value Date   LDLCALC 73 12/05/2013   CREATININE 0.55 10/30/2016       Scheduled Meds: . atorvastatin  40 mg Oral QPM  . budesonide (PULMICORT) nebulizer solution  0.5 mg Nebulization BID  . ceFEPime (MAXIPIME) IV  1 g Intravenous Q8H  . feeding supplement (ENSURE ENLIVE)  237 mL Oral TID BM  . guaiFENesin  600 mg Oral BID  . ipratropium-albuterol  3 mL Nebulization QID  . methylPREDNISolone (SOLU-MEDROL) injection  60 mg Intravenous Q6H  . multivitamin with minerals  1 tablet Oral Daily  . vancomycin  750 mg Intravenous Q12H   Continuous Infusions: . sodium chloride 125 mL/hr at 10/30/16 1120  . heparin 1,300 Units/hr (10/30/16 1100)     LOS: 1 day    Time  spent: >30 MINS    [facility] Pager [phone]. If 7PM-7AM, please contact night-coverage at [email or web address], password [facility] 10/30/2016, 11:46 AM  LOS: 1 day

## 2016-10-30 NOTE — Progress Notes (Signed)
Daily Progress Note   Patient Name: [name]       Date: 10/30/2016 DOB: 11-06-1945  Age: 70 y.o. MRN#: [id number] Attending Physician: Reyne Dumas, MD Primary Care Physician: Merrilee Seashore, MD Admit Date: 10/08/2016  Reason for Consultation/Follow-up: Establishing goals of care  Subjective: Patient seen in bed, on Bipap. Denies pain or any other complaints. Followed up on conversation from yesterday re: [place] discussion. She and her family stated they have not discussed further. Patient still desires to be full code and continue aggressive care. Her goals currently are to be able to stabilize and return home. Discussed possibly needing to reframe goals in light of new situation with likelihood of returning home may not be possible.   Review of Systems  Respiratory: Positive for shortness of breath.   All other systems reviewed and are negative.   Length of Stay: 1  Current Medications: Scheduled Meds:  . atorvastatin  40 mg Oral QPM  . budesonide (PULMICORT) nebulizer solution  0.5 mg Nebulization BID  . ceFEPime (MAXIPIME) IV  1 g Intravenous Q8H  . feeding supplement (ENSURE ENLIVE)  237 mL Oral TID BM  . guaiFENesin  600 mg Oral BID  . ipratropium-albuterol  3 mL Nebulization QID  . methylPREDNISolone (SOLU-MEDROL) injection  60 mg Intravenous Q6H  . multivitamin with minerals  1 tablet Oral Daily  . vancomycin  750 mg Intravenous Q12H    Continuous Infusions: . sodium chloride 125 mL/hr at 10/30/16 1200  . heparin 1,300 Units/hr (10/30/16 1200)    PRN Meds: acetaminophen, albuterol, HYDROmorphone (DILAUDID) injection, lidocaine-prilocaine, morphine injection, naLOXone (NARCAN)  injection, [DISCONTINUED] oxyCODONE-acetaminophen **AND** oxyCODONE  Physical Exam    Constitutional: She is oriented to person, place, and time. She appears well-developed and well-nourished. No distress.  Eyes: EOM are normal.  Cardiovascular:  tachycardic  Pulmonary/Chest: She has rales.  Bipap in place, tachypneic     Abdominal: Soft. Bowel sounds are normal.  Mildly distended  Musculoskeletal: Normal range of motion.  Neurological: She is alert and oriented to person, place, and time.  Skin: There is pallor.  Psychiatric: She has a normal mood and affect. Her behavior is normal. Judgment and thought content normal.            Vital Signs: BP (!) 171/72 (BP Location: Left Arm)   Pulse Marland Kitchen)  102   Temp 98 F (36.7 C) (Oral)   Resp (!) 24   Ht '5\' 6"'$  (1.676 m)   Wt 99.6 kg (219 lb 9.3 oz)   SpO2 98%   BMI 35.44 kg/m  SpO2: SpO2: 98 % O2 Device: O2 Device: Bi-PAP O2 Flow Rate: O2 Flow Rate (L/min): 5 L/min  Intake/output summary:  Intake/Output Summary (Last 24 hours) at 10/30/16 1244 Last data filed at 10/30/16 1200  Gross per 24 hour  Intake          4699.17 ml  Output              800 ml  Net          3899.17 ml   LBM: Last BM Date: 10/30/16 Baseline Weight: Weight: 100.7 kg (222 lb 0.1 oz) Most recent weight: Weight: 99.6 kg (219 lb 9.3 oz)       Palliative Assessment/Data: PPS: 20%    Flowsheet Rows   Flowsheet Row Most Recent Value  Intake Tab  Referral Department  Hospitalist  Unit at Time of Referral  ICU  Palliative Care Primary Diagnosis  Cancer  Palliative Care Type  New Palliative care  Reason for referral  Clarify Goals of Care  Date of Admission  11/01/2016  Date first seen by Palliative Care  10/29/16  Clinical Assessment  Palliative Performance Scale Score  10%  Psychosocial & Spiritual Assessment  Palliative Care Outcomes  Palliative Care Outcomes  Clarified goals of care, Provided advance care planning  Palliative Care follow-up planned  Yes, Facility      Patient Active Problem List   Diagnosis Date Noted  . Palliative  care by specialist   . Malignant neoplasm of lung ([place])   . Acute respiratory failure ([place])   . Weakness 10/30/2016  . Non-small cell carcinoma of lung ([place]) 10/09/2016  . Encounter for antineoplastic chemotherapy 10/24/2016  . Goals of care, counseling/discussion 10/24/2016  . Back pain 10/24/2016  . COPD exacerbation ([place]) 10/14/2016  . Pleural effusion 10/14/2016  . Malignant pleural effusion 10/08/2016  . Adenocarcinoma of lung, right ([place]) 10/08/2016  . Essential hypertension 10/02/2016  . Acute on chronic respiratory failure with hypoxia ([place]) 10/02/2016  . Recurrent right pleural effusion 10/02/2016  . Leukocytosis 10/02/2016  . Hypokalemia 10/02/2016  . Right lower lobe pneumonia ([place]) 10/02/2016  . Thyroid nodule 10/02/2016  . Adenocarcinoma of right lung, stage 4 ([place]) 10/02/2016  . Chest pain in adult   . Exudative pleural effusion   . Lung nodule seen on imaging study 09/26/2016  . COPD GOLD II 12/03/2013    Palliative Care Assessment & Plan   Patient Profile:  70 y.o. female  with past medical history of COPD, newly diagnosed lung ca (Stage IV, NSC, recurrent malignant pleural effusion, liver, left adrenal and bone mets, scheduled to start radiation tomorrow and first cycle of palliative chemotherapy on 12/28) admitted on 10/17/2016 with general increasing weakness. Workup reveals sepsis likely r/t pneumonia (now on Bipap), and possibly lymphangitic spread of lung ca. CT angio from 12/26 showing small volume PE.    Assessment/Recommendations/Plan   Continue full scope of care  Patient requests PMT returned tomorrow to followup on [place]  Goals of Care and Additional Recommendations:  Limitations on Scope of Treatment: Full Scope Treatment  Code Status:  Full code  Prognosis:   < 6 months due to advanced metastatic NSCLC with recurrent malignant pleural effusion, now with PE  Discharge Planning:  To Be Determined  Care plan  was discussed with patient  and her family.  Thank you for allowing the Palliative Medicine Team to assist in the care of this patient.   Time In: 1130 Time Out: 1145 Total Time 15 mins Prolonged Time Billed No      Greater than 50%  of this time was spent counseling and coordinating care related to the above assessment and plan.  Mariana Kaufman, AGNP-C Palliative Medicine   Please contact Palliative Medicine Team phone at [phone] for questions and concerns.

## 2016-10-30 NOTE — NC FL2 (Signed)
[place] LEVEL OF CARE SCREENING TOOL     IDENTIFICATION  Patient Name: [name] Birthdate: 07/02/1946 Sex: female Admission Date (Current Location): 10/15/2016  [facility] [address] and [place] Number:  Herbalist and Address:  [facility],  [place] [address], [place]      Provider Number: [id number]  Attending Physician Name and Address:  Reyne Dumas, MD  Relative Name and Phone Number:       Current Level of Care: Hospital Recommended Level of Care: [place] Prior Approval Number:    Date Approved/Denied:   PASRR Number: [id number]  Discharge Plan: SNF    Current Diagnoses: Patient Active Problem List   Diagnosis Date Noted  . SOB (shortness of breath)   . Palliative care by specialist   . Malignant neoplasm of lung ([place])   . Acute respiratory failure ([place])   . Weakness 10/29/2016  . Non-small cell carcinoma of lung ([place]) 10/05/2016  . Encounter for antineoplastic chemotherapy 10/24/2016  . Goals of care, counseling/discussion 10/24/2016  . Back pain 10/24/2016  . COPD exacerbation ([place]) 10/14/2016  . Pleural effusion 10/14/2016  . Malignant pleural effusion 10/08/2016  . Adenocarcinoma of lung, right ([place]) 10/08/2016  . Essential hypertension 10/02/2016  . Acute on chronic respiratory failure with hypoxia ([place]) 10/02/2016  . Recurrent right pleural effusion 10/02/2016  . Leukocytosis 10/02/2016  . Hypokalemia 10/02/2016  . Right lower lobe pneumonia ([place]) 10/02/2016  . Thyroid nodule 10/02/2016  . Adenocarcinoma of right lung, stage 4 ([place]) 10/02/2016  . Chest pain in adult   . Exudative pleural effusion   . Lung nodule seen on imaging study 09/26/2016  . COPD GOLD II 12/03/2013    Orientation RESPIRATION BLADDER Height & Weight     Self, Time, Situation, Place   (5L) Continent Weight: 219 lb 9.3 oz (99.6 kg) Height:  '5\' 6"'$  (167.6 cm)  BEHAVIORAL SYMPTOMS/MOOD NEUROLOGICAL BOWEL NUTRITION STATUS       Continent Diet (Heart Healthy)  AMBULATORY STATUS COMMUNICATION OF NEEDS Skin   Extensive Assist Verbally Normal                       Personal Care Assistance Level of Assistance  Bathing, Feeding, Dressing Bathing Assistance: Limited assistance Feeding assistance: Independent Dressing Assistance: Limited assistance     Functional Limitations Info  Sight, Hearing, Speech Sight Info: Adequate Hearing Info: Adequate Speech Info: Adequate    SPECIAL CARE FACTORS FREQUENCY  PT (By licensed PT), OT (By licensed OT)     PT Frequency: 5 OT Frequency: 5            Contractures Contractures Info: Not present    Additional Factors Info  Code Status, Allergies Code Status Info: Fullcode Allergies Info: No Known Allergies           Current Medications (10/30/2016):  This is the current hospital active medication list Current Facility-Administered Medications  Medication Dose Route Frequency Provider Last Rate Last Dose  . 0.9 %  sodium chloride infusion   Intravenous Continuous Lacretia Leigh, MD 125 mL/hr at 10/30/16 1600    . acetaminophen (TYLENOL) tablet 650 mg  650 mg Oral Q4H PRN Rhetta Mura Schorr, NP      . albuterol (PROVENTIL) (2.5 MG/3ML) 0.083% nebulizer solution 2.5 mg  2.5 mg Inhalation Q6H PRN Eber Jones, MD   2.5 mg at 10/30/16 0429  . atorvastatin (LIPITOR) tablet 40 mg  40 mg Oral QPM [place] U  Adair Patter, MD   40 mg at 10/29/16 1709  . budesonide (PULMICORT) nebulizer solution 0.5 mg  0.5 mg Nebulization BID Praveen Mannam, MD   0.5 mg at 10/30/16 0957  . ceFEPIme (MAXIPIME) 1 g in dextrose 5 % 50 mL IVPB  1 g Intravenous Q8H Thomes Lolling, RPH   1 g at 10/30/16 0859  . feeding supplement (ENSURE ENLIVE) (ENSURE ENLIVE) liquid 237 mL  237 mL Oral TID BM Reyne Dumas, MD   237 mL at 10/29/16 2203  . guaiFENesin (MUCINEX) 12 hr tablet 600 mg  600 mg Oral BID Eber Jones, MD   600 mg at 10/29/16 2129  . heparin ADULT infusion  100 units/mL (25000 units/234m sodium chloride 0.45%)  1,300 Units/hr Intravenous Continuous Leann T Poindexter, RPH 13 mL/hr at 10/30/16 1600 1,300 Units/hr at 10/30/16 1600  . HYDROmorphone (DILAUDID) injection 0.5 mg  0.5 mg Intravenous Q4H PRN AEber Jones MD      . ipratropium-albuterol (DUONEB) 0.5-2.5 (3) MG/3ML nebulizer solution 3 mL  3 mL Nebulization QID NReyne Dumas MD   3 mL at 10/30/16 1648  . lidocaine-prilocaine (EMLA) cream 1 application  1 application Topical Daily PRN AEber Jones MD      . methylPREDNISolone sodium succinate (SOLU-MEDROL) 125 mg/2 mL injection 60 mg  60 mg Intravenous Q6H Praveen Mannam, MD   60 mg at 10/30/16 1121  . morphine 2 MG/ML injection 2 mg  2 mg Intravenous Q4H PRN AEber Jones MD   2 mg at 10/30/16 1325  . multivitamin with minerals tablet 1 tablet  1 tablet Oral Daily NReyne Dumas MD   1 tablet at 10/29/16 1709  . naloxone ([place] injection 0.4 mg  0.4 mg Intravenous PRN NReyne Dumas MD   0.4 mg at 10/29/16 0841  . oxyCODONE (Oxy IR/ROXICODONE) immediate release tablet 5 mg  5 mg Oral Q6H PRN DBerton Mount RPH      . vancomycin (VANCOCIN) IVPB 750 mg/150 ml premix  750 mg Intravenous Q12H AThomes Lolling RPH   750 mg at 10/30/16 1119     Discharge Medications: Please see discharge summary for a list of discharge medications.  Relevant Imaging Results:  Relevant Lab Results:   Additional Information SSN: [id number] NLia Hopping LCSW

## 2016-10-30 NOTE — Discharge Planning (Signed)
[facility]  [facility] is providing the following services: SN  If patient discharges after hours, please call [phone].   Melene Muller 10/30/2016, 10:33 AM

## 2016-10-30 NOTE — Progress Notes (Signed)
ANTICOAGULATION CONSULT NOTE - Follow up [place] for Heparin Indication: pulmonary embolus  No Known Allergies  Patient Measurements: Height: '5\' 6"'$  (167.6 cm) Weight: 219 lb 9.3 oz (99.6 kg) IBW/kg (Calculated) : 59.3 Heparin Dosing Weight: 81 kg  Vital Signs: Temp: 98 F (36.7 C) (12/27 0800) Temp Source: Oral (12/27 0800) BP: 159/70 (12/27 0400) Pulse Rate: 76 (12/27 0700)  Labs:  Recent Labs  10/15/2016 1822 10/06/2016 2249 10/29/16 0430 10/29/16 0908 10/30/16 0055 10/30/16 0931  HGB 9.5*  --   --  9.5* 9.5*  --   HCT 31.4*  --   --  32.0* 32.0*  --   PLT 295  --   --  298 279  --   HEPARINUNFRC  --   --   --   --  0.44 0.44  CREATININE 0.64  --  0.85 0.72 0.55  --   TROPONINI 0.03* 0.03* 0.03*  --   --   --     Estimated Creatinine Clearance: 75.7 mL/min (by C-G formula based on SCr of 0.55 mg/dL).   Medical History: Past Medical History:  Diagnosis Date  . Back pain 10/24/2016  . Cancer [facility])    right breast cancer  . COPD (chronic obstructive pulmonary disease) ([place])   . Encounter for antineoplastic chemotherapy 10/24/2016  . Goals of care, counseling/discussion 10/24/2016  . Oxygen dependent     Assessment: 70 yr female admitted on 12/25 with weakness.  PMH significant for recent diagnosis of NSCLC and was to begin radiation/palliative chemo this week.  CTAngio shows + PE.  Pharmacy is consulted to dose Heparin IV.   Upon admission patient was ordered Lovenox '40mg'$  sq q24h for VTE prophylaxis - last dose was given 12/25 @ 20:24  Today, 10/30/2016:  Heparin level 0.44 remains therapeutic on heparin at 1300 units/hr  CBC: Hgb 9.5 is low/stable, and Plt remain WNL  No bleeding or complications reported.  SCr 0.55   Goal of Therapy:  Heparin level 0.3-0.7 units/ml Monitor platelets by anticoagulation protocol: Yes   Plan:  Continue heparin IV infusion at 1300 units/hr Daily heparin level and CBC  Continue to monitor H&H and  platelets   Gretta Arab PharmD, BCPS Pager [phone] 10/30/2016 11:01 AM

## 2016-10-30 NOTE — Telephone Encounter (Signed)
I spoke with [place] RN who is taking care of Ms. Yearick in the ICU at [facility] today. She reports to me that she spoke with the Hospitalist Dr. Allyson Sabal today. Dr. Allyson Sabal does not feel like she would tolerate her simulation today due to respiratory complications as she currently on Bipap to assist her breathing. Debarah Crape will call me back if anything changes today. I spoke with Amy in [place] and made her aware of the above. Amy voiced that she would contact Dr. Sondra Come via email to let make him aware of the situation.

## 2016-10-30 NOTE — Progress Notes (Signed)
ANTICOAGULATION CONSULT NOTE - Follow Up Consult  Pharmacy Consult for Heparin Indication: pulmonary embolus  No Known Allergies  Patient Measurements: Height: '5\' 6"'$  (167.6 cm) Weight: 219 lb 9.3 oz (99.6 kg) IBW/kg (Calculated) : 59.3 Heparin Dosing Weight:   Vital Signs: Temp: 99.3 F (37.4 C) (12/27 0000) Temp Source: Axillary (12/27 0000) BP: 143/62 (12/27 0000) Pulse Rate: 75 (12/27 0000)  Labs:  Recent Labs  11/02/2016 1822 10/08/2016 2249 10/29/16 0430 10/29/16 0908 10/30/16 0055  HGB 9.5*  --   --  9.5* 9.5*  HCT 31.4*  --   --  32.0* 32.0*  PLT 295  --   --  298 279  HEPARINUNFRC  --   --   --   --  0.44  CREATININE 0.64  --  0.85 0.72 0.55  TROPONINI 0.03* 0.03* 0.03*  --   --     Estimated Creatinine Clearance: 75.7 mL/min (by C-G formula based on SCr of 0.55 mg/dL).   Medications:  Infusions:  . sodium chloride 125 mL/hr at 10/29/16 1703  . heparin 1,300 Units/hr (10/29/16 1650)    Assessment: Patient with heparin level at goal.  No heparin issues noted.  Goal of Therapy:  Heparin level 0.3-0.7 units/ml Monitor platelets by anticoagulation protocol: Yes   Plan:  Continue heparin drip at current rate Recheck level at 0900, then daily Heparin level  Tyler Deis, Shea Stakes [place] 10/30/2016,1:58 AM

## 2016-10-30 NOTE — Evaluation (Signed)
Clinical/Bedside Swallow Evaluation Patient Details  Name: [name] MRN: [id number] Date of Birth: 1946-05-26  Today's Date: 10/30/2016 Time: SLP Start Time (ACUTE ONLY): 0910 SLP Stop Time (ACUTE ONLY): 0932 SLP Time Calculation (min) (ACUTE ONLY): 22 min  Past Medical History:  Past Medical History:  Diagnosis Date  . Back pain 10/24/2016  . Cancer [facility])    right breast cancer  . COPD (chronic obstructive pulmonary disease) ([place])   . Encounter for antineoplastic chemotherapy 10/24/2016  . Goals of care, counseling/discussion 10/24/2016  . Oxygen dependent    Past Surgical History:  Past Surgical History:  Procedure Laterality Date  . ABDOMINAL HYSTERECTOMY  1990  . BACK SURGERY    . BREAST SURGERY Right    lumpectomy w/ radiation  . IR GENERIC HISTORICAL  10/07/2016   IR GUIDED DRAIN W CATHETER PLACEMENT 10/07/2016 Sandi Mariscal, MD [facility]-INTERV RAD  . IR GENERIC HISTORICAL  10/10/2016   IR REMOVAL OF PLURAL CATH W/CUFF 10/10/2016 Corrie Mckusick, DO [facility]-INTERV RAD   HPI:  70 y.o. female with medical history significant of metastatic non small cell lung cancer with recurrent malignant right pleural effusion, COPD, HTN and RA who presents to the ED with general increasing weakness.  Patient states that she fell on the way to the bathroom on 10/19/2016.  Patients roommate and her niece attempted to get her up but were unsuccessful.  Patient's sister is bedside and states that patient has not eaten more than a few bites of a banana in the past week and is so weak that she falls because she cannot use her legs.  Patient's sister states that patient is in significant amount of pain and it is unbearable.  Patient reports pain in her lower left back.  Patient does have metastases in her hip and pelvis.  Patient can no longer even ambulate with walker.  Sister states in her current state patient absolutely cannot go home due to her weakness and pain; CT chest on 10/29/16 indicated Small volume pulmonary  embolus, nonocclusive left main pulmonary artery and involving subsegmental pulmonary artery to the right lower lobe. No evidence for right heart strain; bony metastatic involvement noted.  Assessment / Plan / Recommendation Clinical Impression   Pt with limited BSE given solids/thin liquids via cup only d/t dyspnea.  Pt without overt s/s of aspiration with these consistencies; decreased appetite per pt, but noted with use of smaller bites/sips and no straws, her swallowing is easier d/t respiratory status being compromised at present; discussed swallowing precautions with pt d/t respiratory status and possibility of modifying diet if pt requests to assist with swallowing/breathing balance; Con't regular/thin diet as tolerated; ST will f/u while in house for swallowing safety/accuracy with diet tolerance and education re: swallowing strategies.    Aspiration Risk  Mild aspiration risk    Diet Recommendation     Medication Administration: Whole meds with liquid (as tolerated and/or via IV)    Other  Recommendations Oral Care Recommendations: Oral care BID   Follow up Recommendations   TBD     Frequency and Duration min 2x/week  1 week       Prognosis Prognosis for Safe Diet Advancement: Good      Swallow Study   General Date of Onset: 10/17/2016 HPI: 70 y.o. female with medical history significant of metastatic non small cell lung cancer with recurrent malignant right pleural effusion, COPD (on home oxygen of  L [place]), HTN and RA who presents to the ED with general increasing weakness.  Patient states that she fell on the way to the bathroom.  Patients roommate and her sisters daughter attempted to get her up but were unsuccessful.  Patient's sister is bedside and states that patient has not eaten more than a few bites of a banana in the past week and is so weak that she falls because she cannot use her legs.  Patient's sister states that patient is in significant amount of pain and it is  unbearable.  Patient reports pain in her lower left back.  Patient does have metastases in her hip and pelvis.  Patient can no longer even ambulate with walker.  Sister states in her current state patient absolutely cannot go home due to her weakness and pain Type of Study: Bedside Swallow Evaluation Previous Swallow Assessment: n/a Diet Prior to this Study: Regular;Thin liquids Temperature Spikes Noted: Yes Respiratory Status: Nasal cannula;Other (comment) (pt on Bipap yesterday) History of Recent Intubation: No Behavior/Cognition: Alert;Cooperative;Pleasant mood Oral Cavity Assessment: Within Functional Limits Oral Care Completed by SLP: No Oral Cavity - Dentition: Adequate natural dentition Vision: Functional for self-feeding Self-Feeding Abilities: Able to feed self Patient Positioning: Upright in bed Baseline Vocal Quality: Low vocal intensity (d/t dyspnea) Volitional Cough: Strong Volitional Swallow: Able to elicit    Oral/Motor/Sensory Function Overall Oral Motor/Sensory Function: Within functional limits   Ice Chips Ice chips: Not tested   Thin Liquid Thin Liquid: Within functional limits Presentation: Cup    Nectar Thick Nectar Thick Liquid: Not tested   Honey Thick Honey Thick Liquid: Not tested   Puree Puree: Not tested   Solid      Solid: Within functional limits Presentation: Self Fed Other Comments: small amount given d/t dyspnea    Functional Assessment Tool Used: NOMS Functional Limitations: Swallowing Swallow Current Status (S4967): At least 20 percent but less than 40 percent impaired, limited or restricted Swallow Goal Status [phone]): At least 1 percent but less than 20 percent impaired, limited or restricted   ADAMS,PAT, M.S., CCC-SLP 10/30/2016,9:52 AM

## 2016-10-30 NOTE — Progress Notes (Signed)
OT Cancellation Note  Patient Details Name: [name] MRN: [id number] DOB: [birth year]   Cancelled Treatment:    Reason Eval/Treat Not Completed: Medical issues which prohibited therapy.  Pt has been on BiPAP this am.  Per MD notes, PT/OT to hold off.  Will check back.  [place], OTR/L [phone]   [name] 10/30/2016, 12:58 PM

## 2016-10-31 DIAGNOSIS — J449 Chronic obstructive pulmonary disease, unspecified: Secondary | ICD-10-CM

## 2016-10-31 DIAGNOSIS — E43 Unspecified severe protein-calorie malnutrition: Secondary | ICD-10-CM | POA: Insufficient documentation

## 2016-10-31 DIAGNOSIS — J9600 Acute respiratory failure, unspecified whether with hypoxia or hypercapnia: Secondary | ICD-10-CM

## 2016-10-31 DIAGNOSIS — I2699 Other pulmonary embolism without acute cor pulmonale: Secondary | ICD-10-CM

## 2016-10-31 DIAGNOSIS — C3431 Malignant neoplasm of lower lobe, right bronchus or lung: Secondary | ICD-10-CM

## 2016-10-31 LAB — HEPARIN LEVEL (UNFRACTIONATED): Heparin Unfractionated: 0.63 IU/mL (ref 0.30–0.70)

## 2016-10-31 MED ORDER — METHYLPREDNISOLONE SODIUM SUCC 125 MG IJ SOLR
60.0000 mg | Freq: Two times a day (BID) | INTRAMUSCULAR | Status: DC
  Administered 2016-10-31 – 2016-11-02 (×4): 60 mg via INTRAVENOUS
  Filled 2016-10-31 (×4): qty 2

## 2016-10-31 MED ORDER — ENOXAPARIN SODIUM 100 MG/ML ~~LOC~~ SOLN
100.0000 mg | Freq: Once | SUBCUTANEOUS | Status: AC
  Administered 2016-11-01: 100 mg via SUBCUTANEOUS
  Filled 2016-10-31: qty 1

## 2016-10-31 MED ORDER — ENOXAPARIN SODIUM 100 MG/ML ~~LOC~~ SOLN
100.0000 mg | Freq: Once | SUBCUTANEOUS | Status: AC
  Administered 2016-10-31: 100 mg via SUBCUTANEOUS
  Filled 2016-10-31: qty 1

## 2016-10-31 MED ORDER — ENOXAPARIN SODIUM 100 MG/ML ~~LOC~~ SOLN
100.0000 mg | Freq: Two times a day (BID) | SUBCUTANEOUS | Status: DC
  Administered 2016-11-01 – 2016-11-05 (×10): 100 mg via SUBCUTANEOUS
  Filled 2016-10-31 (×10): qty 1

## 2016-10-31 NOTE — Progress Notes (Signed)
Patient remains off of NIV at this time with no complications. NIV on standby if needed. Will continue to monitor pt.

## 2016-10-31 NOTE — Progress Notes (Signed)
Patient unable to tolerate arm blood pressures, however leg pressures reading incredibly high. After pain medication patient agreed to have manual arm cuff pressure that is within an acceptable range.

## 2016-10-31 NOTE — Progress Notes (Signed)
ANTICOAGULATION CONSULT NOTE - Follow up [place] for Heparin Indication: pulmonary embolus  No Known Allergies  Patient Measurements: Height: '5\' 6"'$  (167.6 cm) Weight: 219 lb 9.3 oz (99.6 kg) IBW/kg (Calculated) : 59.3 Heparin Dosing Weight: 81 kg  Vital Signs: Temp: 98.4 F (36.9 C) (12/28 0400) Temp Source: Oral (12/28 0400) BP: 166/115 (12/28 0000) Pulse Rate: 105 (12/28 0000)  Labs:  Recent Labs  10/05/2016 1822 10/22/2016 2249 10/29/16 0430 10/29/16 0908 10/30/16 0055 10/30/16 0931 10/31/16 0340  HGB 9.5*  --   --  9.5* 9.5*  --   --   HCT 31.4*  --   --  32.0* 32.0*  --   --   PLT 295  --   --  298 279  --   --   HEPARINUNFRC  --   --   --   --  0.44 0.44 0.63  CREATININE 0.64  --  0.85 0.72 0.55  --   --   TROPONINI 0.03* 0.03* 0.03*  --   --   --   --     Estimated Creatinine Clearance: 75.7 mL/min (by C-G formula based on SCr of 0.55 mg/dL).   Medical History: Past Medical History:  Diagnosis Date  . Back pain 10/24/2016  . Cancer [facility])    right breast cancer  . COPD (chronic obstructive pulmonary disease) ([place])   . Encounter for antineoplastic chemotherapy 10/24/2016  . Goals of care, counseling/discussion 10/24/2016  . Oxygen dependent     Assessment: 70 yr female admitted on 12/25 with weakness.  PMH significant for recent diagnosis of NSCLC and was to begin radiation/palliative chemo this week.  CTAngio shows + PE.  Pharmacy is consulted to dose Heparin IV.   Upon admission patient was ordered Lovenox '40mg'$  sq q24h for VTE prophylaxis - last dose was given 12/25 @ 20:24  Today, 10/31/2016:  Heparin level 0.63 remains therapeutic on heparin at 1300 units/hr  CBC: Hgb 9.5 is low/stable, and Plt remain WNL (last on 12/27)  No bleeding or complications reported.  SCr 0.55, CrCl ~ 75 ml/min   Goal of Therapy:  Heparin level 0.3-0.7 units/ml Monitor platelets by anticoagulation protocol: Yes   Plan:  Continue heparin IV  infusion at 1300 units/hr Daily heparin level and CBC  Continue to monitor H&H and platelets   Gretta Arab PharmD, BCPS Pager [phone] 10/31/2016 7:03 AM

## 2016-10-31 NOTE — Progress Notes (Signed)
Speech Language Pathology Treatment: Dysphagia  Patient Details Name: [name] MRN: [id number] DOB: 12-21-1945 Today's Date: 10/31/2016 Time: 6389-3734 SLP Time Calculation (min) (ACUTE ONLY): 14 min  Assessment / Plan / Recommendation Clinical Impression  Pt consumed thin via cup and solids (minimal amounts d/t decreased appetite/ intermittent SOB) without s/s of aspiration noted; pt's vocal quality improved today and she stated she had not been having difficulty eating/drinking during meals.  Pt reminded of swallowing/breathing balance during meals and to implement small bites/sips and limit straw usage or eliminate it at this time d/t dyspnea.  ST will f/u for diet tolerance 1-2 more times to ensure education completed re: aspiration risk d/t respiratory compromise and safety with swallowing.   HPI HPI: 70 y.o. female with medical history significant of metastatic non small cell lung cancer with recurrent malignant right pleural effusion, COPD (on home oxygen of  L [place]), HTN and RA who presents to the ED with general increasing weakness.  Patient states that she fell on the way to the bathroom.  Patients roommate and her sisters daughter attempted to get her up but were unsuccessful.  Patient's sister is bedside and states that patient has not eaten more than a few bites of a banana in the past week and is so weak that she falls because she cannot use her legs.  Patient's sister states that patient is in significant amount of pain and it is unbearable.  Patient reports pain in her lower left back.  Patient does have metastases in her hip and pelvis.  Patient can no longer even ambulate with walker.  Sister states in her current state patient absolutely cannot go home due to her weakness and pain      SLP Plan  Continue with current plan of care     Recommendations  Diet recommendations: Regular;Thin liquid Liquids provided via: Cup Medication Administration: Whole meds with  liquid Supervision: Patient able to self feed Compensations: Slow rate;Small sips/bites Postural Changes and/or Swallow Maneuvers: Seated upright 90 degrees                Oral Care Recommendations: Oral care BID Plan: Continue with current plan of care                       ADAMS,PAT, M.S., CCC-SLP 10/31/2016, 3:39 PM

## 2016-10-31 NOTE — Progress Notes (Signed)
[facility] PROGRESS NOTE  [name] QMG:[id number] DOB: 1946-07-12 DOA: 10/20/2016   PCP: Merrilee Seashore, MD     Assessment/Plan: Principal Problem:   Weakness Active Problems:   COPD GOLD II   Essential hypertension   Hypokalemia   Goals of care, counseling/discussion   Back pain   Non-small cell carcinoma of lung ([place])   Palliative care by specialist   Malignant neoplasm of lung ([place])   Acute respiratory failure (HCC)   SOB (shortness of breath)    70 y.o. 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  Patient states that she fell on the way to the bathroom.  Patients roommate and her sisters daughter attempted to get her up but were unsuccessful. Patient evaluated by ED physician.  Potassium slightly low at 3.2, Troponin slightly elevated, WBC increased at 14.6.  Patient is tachypneic, tachycardic, afebrile, lactic acid was not ordered.  TRH was asked to admit for dyspnea, and generalized weakness. Patient was scheduled to start post chemotherapy 12/26  Assessment and plan Sepsis likely secondary to pneumonia Met sepsis criteria Tachycardic tachypneic, febrile, improved  Focused sepsis protocol initiated-lactic acid 1.2, Pro calcitonin 0.21,  patient on broad-spectrum antibiotics, dc vanc as BC nGSF, taper steroids  Notified oncology-Dr. Julien Nordmann 12/25 Initial chest x-ray did not show any significant changes, suspected lymphangitic spread CT scan 12/12 showed multiple small pockets of loculated pleural fluid within the right hemithorax/pulmonary nodules right upper and right middle lobe. CT PE protocol showed subsegmental PE Due to tachypnea patient placed on BiPAP, now off on 5L Discussed CODE STATUS would like to be a full code Appreciate critical care input ABG  noted-improving Continue step down  ACUTE PE Nonocclusive Continue  heparin gtt, need to make a decision about Lovenox vs NOAC at DC   Weakness likely secondary to above   PT/OT consult - SW consult as patient will likely not be able to return back to previous apartment - progressively worsening Discontinue sedating medications, much more awake    Anorexia - nutrition consult placed  Non- small cell carcinoma of the lung - supposed to go for radiation mapping , prohibited because of pulmonary status - patient postponed port placement due to weakness this past week - per notes from Dr. Julien Nordmann of oncology chemotherapy is palliative only, notified Dr. Julien Nordmann that patient is being moved to stepdown, and would like to be a full code - palliative care consulted   Hypokalemia Repleted - will repeat BMP in am     Back Pain - secondary to metastases Hold MS Contin and Oxycodone given somnolence PRN morphine and diluadid for breakthrugh pain      DVT prophylaxsis heparin gtt  Code Status:  Full code    Family Communication: Discussed in detail with the patient/neice, all imaging results, lab results explained to the patient   Disposition Plan:  Continue stepdown      Consultants:  Called Dr. Julien Nordmann 12/26  Critical care  Procedures:  None  Antibiotics: Anti-infectives    Start     Dose/Rate Route Frequency Ordered Stop   10/29/16 2200  vancomycin (VANCOCIN) IVPB 750 mg/150 ml premix     750 mg 150 mL/hr over 60 Minutes Intravenous Every 12 hours 10/29/16 0825     10/29/16 1700  ceFEPIme (MAXIPIME) 1 g  in dextrose 5 % 50 mL IVPB     1 g 100 mL/hr over 30 Minutes Intravenous Every 8 hours 10/29/16 0825     10/29/16 1000  vancomycin (VANCOCIN) 1,500 mg in sodium chloride 0.9 % 500 mL IVPB     1,500 mg 250 mL/hr over 120 Minutes Intravenous  Once 10/29/16 0825 10/29/16 1218   10/29/16 0830  ceFEPIme (MAXIPIME) 1 g in dextrose 5 % 50 mL IVPB     1  g 100 mL/hr over 30 Minutes Intravenous NOW 10/29/16 0825 10/29/16 1003         HPI/Subjective: On [place] , denies CP,SOB  Objective: Vitals:   10/31/16 1000 10/31/16 1011 10/31/16 1024 10/31/16 1029  BP:  (!) 229/72 (!) 170/75   Pulse: 99 100 90   Resp: (!) 30 (!) 23 16   Temp:      TempSrc:      SpO2: 95% 96% 96% 96%  Weight:      Height:        Intake/Output Summary (Last 24 hours) at 10/31/16 1055 Last data filed at 10/31/16 0910  Gross per 24 hour  Intake             3071 ml  Output              600 ml  Net             2471 ml    Exam:  Examination:  General exam: Appears calm and comfortable  Respiratory system: Clear to auscultation. Respiratory effort normal. Cardiovascular system: S1 & S2 heard, RRR. No JVD, murmurs, rubs, gallops or clicks. No pedal edema. Gastrointestinal system: Abdomen is nondistended, soft and nontender. No organomegaly or masses felt. Normal bowel sounds heard. Central nervous system: Alert and oriented. No focal neurological deficits. Extremities: Symmetric 5 x 5 power. Skin: No rashes, lesions or ulcers Psychiatry: Judgement and insight appear normal. Mood & affect appropriate.     Data Reviewed: I have personally reviewed following labs and imaging studies  Micro Results Recent Results (from the past 240 hour(s))  Urine culture     Status: Abnormal   Collection Time: 10/07/2016  4:29 PM  Result Value Ref Range Status   Specimen Description URINE, RANDOM  Final   Special Requests NONE  Final   Culture (A)  Final    <10,000 COLONIES/mL INSIGNIFICANT GROWTH Performed at [facility]    Report Status 10/29/2016 FINAL  Final  Culture, blood (routine x 2)     Status: None (Preliminary result)   Collection Time: 10/29/16  9:08 AM  Result Value Ref Range Status   Specimen Description BLOOD RIGHT ARM  Final   Special Requests BOTTLES DRAWN AEROBIC AND ANAEROBIC 5CC  Final   Culture   Final    NO GROWTH 1 DAY Performed at  [facility]    Report Status PENDING  Incomplete  Culture, blood (routine x 2)     Status: None (Preliminary result)   Collection Time: 10/29/16  9:11 AM  Result Value Ref Range Status   Specimen Description BLOOD RIGHT HAND  Final   Special Requests BOTTLES DRAWN AEROBIC AND ANAEROBIC 5CC  Final   Culture   Final    NO GROWTH 1 DAY Performed at [facility]    Report Status PENDING  Incomplete    Radiology Reports Dg Chest 1 View  Result Date: 10/01/2016 CLINICAL DATA:  Post right thoracentesis EXAM: CHEST 1 VIEW COMPARISON:  09/28/2016  FINDINGS: Decreasing right pleural effusion following thoracentesis. No pneumothorax. Small right effusion and right base atelectasis persists. Heart is borderline in size. Left lung is clear. IMPRESSION: No pneumothorax following right thoracentesis. Small residual right effusion with right base atelectasis. Electronically Signed   By: Rolm Baptise M.D.   On: 10/01/2016 14:33   Dg Chest 2 View  Result Date: 10/10/2016 CLINICAL DATA:  Shortness of breath.  History COPD.  Former smoker. EXAM: CHEST  2 VIEW COMPARISON:  PET-CT 10/22/2016 FINDINGS: Cardiomediastinal silhouette is normal. Mediastinal contours appear intact. Calcific atherosclerotic disease of the aorta seen. There is no evidence of pneumothorax. Nodular densities overlying the right hemithorax likely represent loculated pleural effusion. Malignant appearing nodules demonstrated by recent PET-CT in the right upper and right middle lobe are poorly visualized. Mild interstitial thickening on the right. Osseous structures are without acute abnormality. Soft tissues are grossly normal. IMPRESSION: No significant change in findings suggestive of right lung malignancy with potential lymphangitic spread of disease. Electronically Signed   By: Fidela Salisbury M.D.   On: 10/29/2016 15:04   Dg Chest 2 View  Result Date: 10/16/2016 CLINICAL DATA:  Follow-up pleural effusion . EXAM:  CHEST  2 VIEW COMPARISON:  CT 10/14/2016.  Chest x-ray 10/14/2016 09/25/2016. FINDINGS: Mediastinum and hilar structures are normal. Heart size normal. Right lower lobe atelectasis and infiltrate. Small right pleural effusion. No acute bony abnormality identified. IMPRESSION: Right lower lobe atelectasis and infiltrate again noted. With small right pleural effusion noted. Similar findings noted on prior exam. No pneumothorax. Electronically Signed   By: Marcello Moores  Register   On: 10/16/2016 10:32   Dg Chest 2 View  Result Date: 10/14/2016 CLINICAL DATA:  Left-sided breast pain and shortness of breath. History of lung cancer. EXAM: CHEST  2 VIEW COMPARISON:  Chest radiograph 10/07/2016 and chest CT 09/24/2016 FINDINGS: Cardiomediastinal contours are unchanged. Small right pleural effusion with loculated component along the right lateral hemithorax is slightly decreased in size. Multiple nodular opacities overlying the right lung are unchanged. There is atherosclerotic calcification within the aortic arch. No pneumothorax, focal airspace consolidation or pulmonary edema. IMPRESSION: 1. No acute cardiopulmonary disease. 2. Decreased size of partially loculated right pleural effusion and unchanged multifocal nodular opacities of the right hemithorax. 3. Aortic atherosclerosis. Electronically Signed   By: Ulyses Jarred M.D.   On: 10/14/2016 14:12   Dg Chest 2 View  Result Date: 10/07/2016 CLINICAL DATA:  Recurrent right pleural and fusion associated with shortness of breath and chest pain. History of COPD, former smoker, diabetes. Patient also has a history of right-sided breast malignancy. EXAM: CHEST  2 VIEW COMPARISON:  Chest x-rays of October 04, 2016, September 24, 2016, and CT scan of the chest of September 24, 2016. FINDINGS: The left lung is mildly hyperinflated. There is no left-sided pleural effusion. On the right there is a small pleural effusion which has increased slightly in size over the past 3 days.  There is no pneumothorax. There is stable ovoid density projecting over the posterior aspect of the right seventh rib and patchy interstitial density more inferiorly. Is calcification in the wall of the aortic arch. The heart and pulmonary vascularity are normal. The bony thorax exhibits no acute abnormality. The heart and pulmonary vascularity are normal. The mediastinum is normal in width. IMPRESSION: Slight interval increase in the volume of the small right pleural effusion. Stable underlying chronic bronchitic changes. Patchy density in the right mid lung is stable. Thoracic aortic atherosclerosis. Electronically Signed   By:  David  Martinique M.D.   On: 10/07/2016 09:41   Dg Chest 2 View  Result Date: 10/04/2016 CLINICAL DATA:  Shortness of breath.  Ex-smoker. EXAM: CHEST  2 VIEW COMPARISON:  10/01/2016. FINDINGS: Increasing RIGHT pleural effusion. Compressive atelectasis at the RIGHT base but no definite consolidation. Trace LEFT pleural effusion. No pneumothorax. Normal heart size. IMPRESSION: Beginning re-accumulation of RIGHT pleural effusion post thoracentesis. Electronically Signed   By: Staci Righter M.D.   On: 10/04/2016 10:28   Ct Angio Chest Pe W Or Wo Contrast  Result Date: 10/29/2016 CLINICAL DATA:  COPD with newly diagnosed lung cancer. EXAM: CT ANGIOGRAPHY CHEST WITH CONTRAST TECHNIQUE: Multidetector CT imaging of the chest was performed using the standard protocol during bolus administration of intravenous contrast. Multiplanar CT image reconstructions and MIPs were obtained to evaluate the vascular anatomy. CONTRAST:  90 cc Isovue 370 COMPARISON:  PET-CT 10/22/2016.  Chest CT 10/14/2016. FINDINGS: Cardiovascular: Heart size upper normal. Coronary artery calcification is noted. Atherosclerotic calcification is noted in the wall of the thoracic aorta.String like small volume pulmonary embolus is identified in the left main pulmonary artery. There is borderline occlusive pulmonary embolus  identified and subsegmental right lower lobe pulmonary arteries (see image 68 series 4). No evidence for right heart strain. Mediastinum/Nodes: Stable 8 mm short axis subcarinal lymph node. Stable 14 mm short axis right hilar lymph node. Lungs/Pleura: Loculated right pleural effusion with loculations in the right fissures, similar to prior. 9 mm right middle lobe pulmonary nodule is stable. 18 mm spiculated subpleural anterior right upper lobe pulmonary nodule, seen to be hypermetabolic on recent PET-CT also stable since previous. Fine detail a both lungs obscured by patient breathing motion. Upper Abdomen: Multiple liver lesions are stable, seen to be hypermetabolic on recent PET scan. No change 18 mm left adrenal nodule. Musculoskeletal: Multiple lytic thoracolumbar spine lesions consistent with metastatic disease, as described previously. Review of the MIP images confirms the above findings. IMPRESSION: 1. Small volume pulmonary embolus, nonocclusive left main pulmonary artery and involving subsegmental pulmonary artery to the right lower lobe. No evidence for right heart strain. 2. Otherwise stable exam with no change in the right pulmonary nodules, mediastinal and right hilar lymphadenopathy and loculated pleural fluid collection/disease. 3. Multiple liver lesions, seen to be hypermetabolic on previous PET imaging. 4. Stable left adrenal nodule 5. Bony metastatic involvement. I discussed these findings by telephone with Dr. Allyson Sabal at 1520 hours on 10/29/2016. Electronically Signed   By: Misty Stanley M.D.   On: 10/29/2016 15:24   Ct Angio Chest Pe W Or Wo Contrast  Result Date: 10/14/2016 CLINICAL DATA:  Short of breath. Pleural drainage catheter removed 1 week prior. EXAM: CT ANGIOGRAPHY CHEST WITH CONTRAST TECHNIQUE: Multidetector CT imaging of the chest was performed using the standard protocol during bolus administration of intravenous contrast. Multiplanar CT image reconstructions and MIPs were  obtained to evaluate the vascular anatomy. CONTRAST:  70 mL Isovue COMPARISON:  CT 09/24/2016 FINDINGS: Cardiovascular: Coronary artery calcification and aortic atherosclerotic calcification. No pericardial fluid. Mediastinum/Nodes: No axillary or supraclavicular adenopathy. No mediastinal adenopathy. Lungs/Pleura: There are multiple small pockets of loculated pleural fluid along the posterior aspect of the RIGHT hemithorax. Interval reduction of moderate dependent effusion effusion seen on comparison exam. Some fluid extends along the fissure. No pneumothorax. Rounded nodule in the anterior RIGHT middle lobe measuring 9 mm (image 63, series 11. Nodule in the anterior RIGHT upper lobe additionally measuring 16 mm on image 44, series. These 2 pulmonary nodules are  not changed comparison exam. LEFT lung clear Upper Abdomen: Limited view of the liver, kidneys, pancreas are unremarkable. Normal adrenal glands. Musculoskeletal: No aggressive osseous lesion. Review of the MIP images confirms the above findings. IMPRESSION: 1. Multiple small pockets of loculated pleural fluid within the RIGHT hemithorax replaces the previous seen moderate pleural effusion. 2. Stable pulmonary nodules the RIGHT upper lobe and RIGHT middle lobe of undetermined etiology. 3. No pneumothorax Electronically Signed   By: Suzy Bouchard M.D.   On: 10/14/2016 17:00   Mr Jeri Cos KG Contrast  Result Date: 10/04/2016 CLINICAL DATA:  New diagnosis of lung cancer. History of breast cancer, hypertension rheumatoid arthritis. Assess for intracranial metastasis. EXAM: MRI HEAD WITHOUT AND WITH CONTRAST TECHNIQUE: Multiplanar, multiecho pulse sequences of the brain and surrounding structures were obtained without and with intravenous contrast. CONTRAST:  79m MULTIHANCE GADOBENATE DIMEGLUMINE 529 MG/ML IV SOLN COMPARISON:  None. FINDINGS: Mild motion degraded examination. INTRACRANIAL CONTENTS: No reduced diffusion to suggest acute ischemia or  hypercellular tumor. No susceptibility artifact to suggest hemorrhage. The ventricles and sulci are normal for patient's age. Patchy supratentorial white matter FLAIR T2 hyperintensities are less than expected for age, compatible with chronic small vessel ischemic disease. No suspicious parenchymal signal, masses, mass effect. No abnormal intraparenchymal or extra-axial enhancement. No abnormal extra-axial fluid collections. No extra-axial masses. VASCULAR: Normal major intracranial vascular flow voids present at skull base. SKULL AND UPPER CERVICAL SPINE: No abnormal sellar expansion. No suspicious calvarial bone marrow signal. Craniocervical junction maintained. Small RIGHT exostosis. SINUSES/ORBITS: RIGHT maxillary mucosal retention cyst. Small RIGHT mastoid effusion.The included ocular globes and orbital contents are non-suspicious. OTHER: None. IMPRESSION: No intracranial metastasis. Negative motion degraded MRI head with and without contrast for age. Electronically Signed   By: CElon AlasM.D.   On: 10/04/2016 18:35   Ir Guided DNiel HummerW Catheter Placement  Result Date: 10/07/2016 CLINICAL DATA:  History of lung cancer, now with recurrent symptomatic right-sided malignant effusion. Please perform tunneled right-sided pleural drainage catheter for palliative purposes. EXAM: INSERTION OF TUNNELED RIGHT SIDED PLEURAL DRAINAGE CATHETER COMPARISON:  Ultrasound-guided thoracentesis - 10/01/2016; 09/25/2016; chest radiograph - 10/07/2016 MEDICATIONS: Ancef 2 gm IV; Antibiotic was administered in an appropriate time interval for the procedure. ANESTHESIA/SEDATION: Moderate (conscious) sedation was employed during this procedure. A total of Versed 2 mg and Fentanyl 50 mcg was administered intravenously. Moderate Sedation Time: 14 minutes. The patient's level of consciousness and vital signs were monitored continuously by radiology nursing throughout the procedure under my direct supervision. FLUOROSCOPY TIME:   FLUOROSCOPY TIME 36 seconds (281.8mGy) COMPLICATIONS: None immediate. PROCEDURE: The procedure, risks, benefits, and alternatives were explained to the patient, who wish to proceed with the placement of this permanent pleural catheter as the patient is seeking palliative care. The patient understand and consent to the procedure. The right inferior lateral chest and upper abdomen were prepped with Chlorhexidine in a sterile fashion, and a sterile drape was applied covering the operative field. A sterile gown and sterile gloves were used for the procedure. Initial ultrasound scanning and fluoroscopic imaging demonstrates a recurrent moderate to large pleural effusion. Under direct ultrasound guidance, the right inferior lateral pleural space was accessed with a Yueh sheath needle after the overlying soft tissues were anesthetized with 1% lidocaine with epinephrine. An Amplatz super stiff wire was then advanced under fluoroscopy into the pleural space. A 15.5 French tunneled Pleur-X catheter was tunneled from an incision within the right upper abdominal quadrant to the access site. The pleural access  site was serially dilated under fluoroscopy, ultimately allowing placement of a peel-away sheath. The catheter was advanced through the peel-away sheath. The sheath was then removed. Final catheter positioning was confirmed with a fluoroscopic radiographic image. The access incision was closed with subcutaneous subcuticular 4-0 Vicryl, Dermabond and Steri-Strips. A Prolene retention suture was applied at the catheter exit site. Large volume thoracentesis was performed through the new catheter utilizing provided bulb vacuum assisted drainage bag. The patient tolerated the above procedure well without immediate postprocedural complication. FINDINGS: Preprocedural ultrasound scanning demonstrates a recurrent moderate sized right sided pleural effusion. After ultrasound and fluoroscopic guided placement, the catheter is coiled  within the caudal aspect of the right hemi thorax at the location of the patient's recurrent malignant effusion. Following catheter placement, approximately 1.3 L of serous though slightly blood tinged pleural fluid was removed. IMPRESSION: Successful placement of permanent, tunneled right pleural drainage catheter via lateral approach. Approximately 1.3 L of serous though slightly blood tinged pleural fluid was removed after catheter placement. Electronically Signed   By: Sandi Mariscal M.D.   On: 10/07/2016 16:42   Nm Pet Image Initial (pi) Skull Base To Thigh  Result Date: 10/22/2016 CLINICAL DATA:  Initial treatment strategy for right lung adenocarcinoma. EXAM: NUCLEAR MEDICINE PET SKULL BASE TO THIGH TECHNIQUE: 11.4 mCi F-18 FDG was injected intravenously. Full-ring PET imaging was performed from the skull base to thigh after the radiotracer. CT data was obtained and used for attenuation correction and anatomic localization. FASTING BLOOD GLUCOSE:  Value: 160 mg/dl COMPARISON:  Chest CTA on 10/14/2016 FINDINGS: NECK No hypermetabolic lymph nodes in the neck. CHEST Small multiloculated right pleural effusion shows diffuse hypermetabolic activity, consistent with malignant pleural effusion. A spiculated nodule in the anterior right upper lobe abutting the pleural surface on image 88/9 is hypermetabolic, with SUV max of 12.2. A 10 mm smoothly marginated pulmonary nodule in the right middle lobe on image 48/7 shows mild hypermetabolism with SUV max of 4.4. No suspicious pulmonary nodule seen in the left lung. 8 mm subcarinal lymph node on image 16/9 is hypermetabolic with SUV max of 8.3. Hypermetabolic activity also seen in right hilum with SUV max of 12.2 ABDOMEN/PELVIS Multiple small hypermetabolic lesions are seen throughout the right and left hepatic lobes, consistent with diffuse liver metastases. 1.8 cm left adrenal mass is hypermetabolic with SUV max of 45.0, consistent with left adrenal metastasis. No  other hypermetabolic masses or lymphadenopathy identified within the abdomen or pelvis. Colonic diverticulosis is noted, without evidence of diverticulitis. Previous hysterectomy. SKELETON Diffuse hypermetabolic lytic bone metastases are seen throughout the spine, bilateral ribs, bilateral scapulae and right humeral head, pelvis, and bilateral hips, consistent with diffuse bone metastases. Several pathologic right rib fractures are noted. IMPRESSION: 16 mm hypermetabolic spiculated nodule in the anterior right upper lobe, and 10 mm hypermetabolic smoothly marginated nodule in right middle lobe, which could represent primary bronchogenic carcinoma or pulmonary metastases. Small malignant multiloculated right pleural effusion. Small hypermetabolic right hilar and subcarinal mediastinal lymph nodes, consistent with metastatic disease. Diffuse liver metastases. Small left adrenal metastasis. Diffuse lytic bone metastases. Several pathologic right rib fractures also noted. Electronically Signed   By: Earle Gell M.D.   On: 10/22/2016 11:53   Dg Chest Port 1 View  Result Date: 10/29/2016 CLINICAL DATA:  Fever.  Unresponsive patient. EXAM: PORTABLE CHEST 1 VIEW COMPARISON:  Chest x-ray 10/29/2016.  Chest CT 10/14/2016 FINDINGS: 0813 hours. The cardio pericardial silhouette is enlarged. Bibasilar atelectasis/ infiltrate. Right pleural effusion with apparent  loculation. The visualized bony structures of the thorax are intact. Telemetry leads overlie the chest. IMPRESSION: Cardiomegaly with bibasilar collapse/ consolidation and right greater than left pleural effusion, likely loculated. Electronically Signed   By: Misty Stanley M.D.   On: 10/29/2016 08:42   Dg Hip Unilat With Pelvis 2-3 Views Left  Result Date: 10/29/2016 CLINICAL DATA:  Hip pain.  Fall. EXAM: DG HIP (WITH OR WITHOUT PELVIS) 2-3V LEFT COMPARISON:  05/07/2011 . FINDINGS: Diffuse osteopenia degenerative change. No evidence of fracture or dislocation.  Surgical clips in the pelvis. Contrast from prior CT noticed in the bladder. IMPRESSION: Degenerative changes lumbar spine and both hips. No acute abnormality identified. No evidence of fracture or dislocation. Electronically Signed   By: Marcello Moores  Register   On: 10/29/2016 15:37   Ir Removal Of Plural Cath W/cuff  Result Date: 10/10/2016 INDICATION: 70 year old female with a history of malignant right-sided pleural effusion and PleurX catheter placed 10/07/2016. She presents today for malfunctioning catheter. EXAM: IR REMOVAL OF PLEURAL CATH WITH CUFF MEDICATIONS: The patient is currently admitted to the hospital and receiving intravenous antibiotics. The antibiotics were administered within an appropriate time frame prior to the initiation of the procedure. ANESTHESIA/SEDATION: None COMPLICATIONS: None PROCEDURE: Informed written consent was obtained from the patient after a thorough discussion of the procedural risks, benefits and alternatives. All questions were addressed. Maximal Sterile Barrier Technique was utilized including caps, mask, sterile gowns, sterile gloves, sterile drape, hand hygiene and skin antiseptic. A timeout was performed prior to the initiation of the procedure. Patient was positioned supine position on the fluoroscopy table. 1% lidocaine was used for local anesthesia. Spot images of the chest were performed. Contrast injection was performed of the catheter. Glidewire was advanced through the catheter in attempt to reposition. Retention suture was ligated in order to Dermabond the skin. Catheter was damaged during the manipulation requiring attempt at exchange. Stiff wire was advanced through the catheter which was removed from the stiff wire. Combination of the peel-away sheath, Kumpe catheter were used in order to stiff in the tract an allow passage of the new catheter over the wiring catheter combination. Exchange of the catheter failed, with inability to passed through the soft tissue  tract and enter the pleura. Catheter was removed and dressing was placed. Patient tolerated the procedure well and remained hemodynamically stable throughout. No complications were encountered and no significant blood loss encountered. FINDINGS: Initial image demonstrates catheter coiled at the base of the lung, potentially subpulmonic. Contrast injection demonstrates contrast accumulating within the pleural is a space. Wire manipulation failed to manipulate the catheter to more superior position. Images demonstrate failed catheter exchange given the damaged catheter. IMPRESSION: Status post failed attempt at exchange of a tunneled right chest PleurX catheter. Signed, Dulcy Fanny. Earleen Newport, DO [facility] Radiology PLAN: The patient will return for a scheduled visit at the [facility] with the chest x-ray to determine need for new PleurX catheter placement versus potential repeat thoracentesis preceding her oncology plan. Electronically Signed   By: Corrie Mckusick D.O.   On: 10/10/2016 16:52   US Thoracentesis Asp Pleural Space W/img Guide  Result Date: 10/01/2016 INDICATION: Recurrent right pleural effusion of unknown etiology. Request for diagnostic and therapeutic thoracentesis. EXAM: ULTRASOUND GUIDED DIAGNOSTIC AND THERAPEUTIC THORACENTESIS MEDICATIONS: 1% Lidocaine COMPLICATIONS: None immediate. PROCEDURE: An ultrasound guided thoracentesis was thoroughly discussed with the patient and questions answered. The benefits, risks, alternatives and complications were also discussed. The patient understands and wishes to proceed  with the procedure. Written consent was obtained. Ultrasound was performed to localize and mark an adequate pocket of fluid in the right chest. The area was then prepped and draped in the normal sterile fashion. 1% Lidocaine was used for local anesthesia. Under ultrasound guidance a Safe-T-Centesis catheter was introduced. Thoracentesis was  performed. The catheter was removed and a dressing applied. FINDINGS: A total of approximately 1.2L of serosanguineous fluid was removed. Samples were sent to the laboratory as requested by the clinical team. IMPRESSION: Successful ultrasound guided right thoracentesis yielding 1.2L of pleural fluid. Read by: Saverio Danker, PA-C Electronically Signed   By: Lucrezia Europe M.D.   On: 10/01/2016 14:31     CBC  Recent Labs Lab 10/24/16 1455 10/07/2016 1448 10/10/2016 1822 10/29/16 0908 10/30/16 0055  WBC 11.6* 14.6* 14.0* 11.2* 7.8  HGB 10.5* 10.6* 9.5* 9.5* 9.5*  HCT 34.2* 34.4* 31.4* 32.0* 32.0*  PLT 302 302 295 298 279  MCV 86.4 86.0 87.0 89.6 88.4  MCH 26.5 26.5 26.3 26.6 26.2  MCHC 30.7* 30.8 30.3 29.7* 29.7*  RDW 16.1* 16.7* 17.0* 17.5* 17.1*  LYMPHSABS 1.2 2.0  --   --   --   MONOABS 0.8 0.8  --   --   --   EOSABS 0.0 0.0  --   --   --   BASOSABS 0.0 0.0  --   --   --     Chemistries   Recent Labs Lab 10/24/16 1455 10/15/2016 1448 10/24/2016 1822 10/29/16 0430 10/29/16 0908 10/30/16 0055  NA 142 141  --  143 142 145  K 3.3* 3.2*  --  3.9 3.9 4.0  CL  --  96*  --  103 104 108  CO2 31* 32  --  31 30 29   GLUCOSE 137 99  --  89 92 168*  BUN 19.3 27*  --  22* 22* 19  CREATININE 0.7 0.68 0.64 0.85 0.72 0.55  CALCIUM 10.1 9.9  --  9.4 9.2 9.0  AST 33 41  --   --  40 42*  ALT 25 25  --   --  23 25  ALKPHOS 120 108  --   --  94 91  BILITOT 0.73 1.1  --   --  0.8 0.9   ------------------------------------------------------------------------------------------------------------------ estimated creatinine clearance is 75.7 mL/min (by C-G formula based on SCr of 0.55 mg/dL). ------------------------------------------------------------------------------------------------------------------ No results for input(s): HGBA1C in the last 72 hours. ------------------------------------------------------------------------------------------------------------------ No results for input(s): CHOL,  HDL, LDLCALC, TRIG, CHOLHDL, LDLDIRECT in the last 72 hours. ------------------------------------------------------------------------------------------------------------------ No results for input(s): TSH, T4TOTAL, T3FREE, THYROIDAB in the last 72 hours.  Invalid input(s): FREET3 ------------------------------------------------------------------------------------------------------------------ No results for input(s): VITAMINB12, FOLATE, FERRITIN, TIBC, IRON, RETICCTPCT in the last 72 hours.  Coagulation profile No results for input(s): INR, PROTIME in the last 168 hours.  No results for input(s): DDIMER in the last 72 hours.  Cardiac Enzymes  Recent Labs Lab 10/27/2016 1822 10/21/2016 2249 10/29/16 0430  TROPONINI 0.03* 0.03* 0.03*   ------------------------------------------------------------------------------------------------------------------ Invalid input(s): POCBNP   CBG: No results for input(s): GLUCAP in the last 168 hours.     Studies: Ct Angio Chest Pe W Or Wo Contrast  Result Date: 10/29/2016 CLINICAL DATA:  COPD with newly diagnosed lung cancer. EXAM: CT ANGIOGRAPHY CHEST WITH CONTRAST TECHNIQUE: Multidetector CT imaging of the chest was performed using the standard protocol during bolus administration of intravenous contrast. Multiplanar CT image reconstructions and MIPs were obtained to evaluate the vascular anatomy. CONTRAST:  90  cc Isovue 370 COMPARISON:  PET-CT 10/22/2016.  Chest CT 10/14/2016. FINDINGS: Cardiovascular: Heart size upper normal. Coronary artery calcification is noted. Atherosclerotic calcification is noted in the wall of the thoracic aorta.String like small volume pulmonary embolus is identified in the left main pulmonary artery. There is borderline occlusive pulmonary embolus identified and subsegmental right lower lobe pulmonary arteries (see image 68 series 4). No evidence for right heart strain. Mediastinum/Nodes: Stable 8 mm short axis subcarinal  lymph node. Stable 14 mm short axis right hilar lymph node. Lungs/Pleura: Loculated right pleural effusion with loculations in the right fissures, similar to prior. 9 mm right middle lobe pulmonary nodule is stable. 18 mm spiculated subpleural anterior right upper lobe pulmonary nodule, seen to be hypermetabolic on recent PET-CT also stable since previous. Fine detail a both lungs obscured by patient breathing motion. Upper Abdomen: Multiple liver lesions are stable, seen to be hypermetabolic on recent PET scan. No change 18 mm left adrenal nodule. Musculoskeletal: Multiple lytic thoracolumbar spine lesions consistent with metastatic disease, as described previously. Review of the MIP images confirms the above findings. IMPRESSION: 1. Small volume pulmonary embolus, nonocclusive left main pulmonary artery and involving subsegmental pulmonary artery to the right lower lobe. No evidence for right heart strain. 2. Otherwise stable exam with no change in the right pulmonary nodules, mediastinal and right hilar lymphadenopathy and loculated pleural fluid collection/disease. 3. Multiple liver lesions, seen to be hypermetabolic on previous PET imaging. 4. Stable left adrenal nodule 5. Bony metastatic involvement. I discussed these findings by telephone with Dr. Allyson Sabal at 1520 hours on 10/29/2016. Electronically Signed   By: Misty Stanley M.D.   On: 10/29/2016 15:24   Dg Hip Unilat With Pelvis 2-3 Views Left  Result Date: 10/29/2016 CLINICAL DATA:  Hip pain.  Fall. EXAM: DG HIP (WITH OR WITHOUT PELVIS) 2-3V LEFT COMPARISON:  05/07/2011 . FINDINGS: Diffuse osteopenia degenerative change. No evidence of fracture or dislocation. Surgical clips in the pelvis. Contrast from prior CT noticed in the bladder. IMPRESSION: Degenerative changes lumbar spine and both hips. No acute abnormality identified. No evidence of fracture or dislocation. Electronically Signed   By: Marcello Moores  Register   On: 10/29/2016 15:37      Lab Results   Component Value Date   HGBA1C 6.4 (H) 12/03/2013   Lab Results  Component Value Date   LDLCALC 73 12/05/2013   CREATININE 0.55 10/30/2016       Scheduled Meds: . atorvastatin  40 mg Oral QPM  . budesonide (PULMICORT) nebulizer solution  0.5 mg Nebulization BID  . ceFEPime (MAXIPIME) IV  1 g Intravenous Q8H  . feeding supplement (ENSURE ENLIVE)  237 mL Oral TID BM  . guaiFENesin  600 mg Oral BID  . ipratropium-albuterol  3 mL Nebulization QID  . methylPREDNISolone (SOLU-MEDROL) injection  60 mg Intravenous Q6H  . multivitamin with minerals  1 tablet Oral Daily  . rOPINIRole  1 mg Oral QHS  . vancomycin  750 mg Intravenous Q12H   Continuous Infusions: . sodium chloride 125 mL/hr at 10/31/16 0801  . heparin 1,300 Units/hr (10/31/16 0720)     LOS: 2 days    Time spent: >30 MINS    [facility] Pager [phone]. If 7PM-7AM, please contact night-coverage at [email or web address], password [facility] 10/31/2016, 10:55 AM  LOS: 2 days

## 2016-10-31 NOTE — Evaluation (Signed)
Occupational Therapy Evaluation Patient Details Name: [name] MRN: [id number] DOB: 03-May-1946 Today's Date: 10/31/2016    History of Present Illness 70 y.o. female admitted with fall. PMH of metastatic lung ca (mets to pelvis and hip), COPD, RA.    Clinical Impression   Pt admitted with above. She demonstrates the below listed deficits and will benefit from continued OT to maximize safety and independence with BADLs.  Pt presents to OT with generalized weakness, decreased activity tolerance, cognition appears impaired as she is unable to recall instructions requiring information to be repeated frequently; impaired balance, and pain.  She requires max A, overall for ADLs and mod A +2 for sit to stand (unable to tolerate pivot to chair).  02 sats remained (90-93% on 6L supplemental 02).  BP and HR stable.  Recommend SNF level rehab at discharge.       Follow Up Recommendations  SNF    Equipment Recommendations  None recommended by OT    Recommendations for Other Services       Precautions / Restrictions Precautions Precautions: Fall Precaution Comments: monitor O2      Mobility Bed Mobility Overal bed mobility: Needs Assistance Bed Mobility: Supine to Sit;Sit to Supine     Supine to sit: Mod assist;HOB elevated Sit to supine: Max assist;+2 for physical assistance   General bed mobility comments: Pt requirse assist to move LEs off bed and to lift shoulders.  She fatigued requiring assist with aspects to return to supine   Transfers Overall transfer level: Needs assistance Equipment used: Rolling walker (2 wheeled) Transfers: Sit to/from Stand Sit to Stand: Mod assist;+2 physical assistance         General transfer comment: Pt requires cues for hand placement, height of bed to be elvated, and assist to lift buttocks from surface and to extend hips.      Balance Overall balance assessment: Needs assistance Sitting-balance support: Feet supported;Single  extremity supported Sitting balance-Leahy Scale: Fair     Standing balance support: Bilateral upper extremity supported Standing balance-Leahy Scale: Poor Standing balance comment: Pt required min A (+2 for safety) to maintain standing ~90 seconds before complaining of dizziness and needing to sit.  BP was stable                             ADL Overall ADL's : Needs assistance/impaired Eating/Feeding: Set up;Bed level   Grooming: Wash/dry hands;Wash/dry face;Oral care;Brushing hair;Minimal assistance;Bed level;Sitting   Upper Body Bathing: Moderate assistance;Bed level   Lower Body Bathing: Maximal assistance;Bed level   Upper Body Dressing : Maximal assistance;Sitting   Lower Body Dressing: Total assistance;Sit to/from Health and safety inspector Details (indicate cue type and reason): Unable to attempt as pt only able to tolerate sit to stand  Toileting- Clothing Manipulation and Hygiene: Total assistance;Bed level       Functional mobility during ADLs: Moderate assistance;+2 for physical assistance;Rolling walker General ADL Comments: Pt limited by endurance, cognition and pain      Vision     Perception     Praxis      Pertinent Vitals/Pain Pain Assessment: Faces Faces Pain Scale: Hurts little more Pain Location: back, generalized  Pain Descriptors / Indicators: Grimacing;Guarding Pain Intervention(s): Monitored during session;Limited activity within patient's tolerance;Repositioned     Hand Dominance Right   Extremity/Trunk Assessment Upper Extremity Assessment Upper Extremity Assessment: Generalized weakness   Lower Extremity Assessment Lower Extremity Assessment: Defer to PT  evaluation       Communication Communication Communication: No difficulties   Cognition Arousal/Alertness: Awake/alert Behavior During Therapy: Anxious;WFL for tasks assessed/performed Overall Cognitive Status: Impaired/Different from baseline Area of Impairment:  Attention;Memory;Following commands;Awareness;Problem solving   Current Attention Level: Selective Memory: Decreased short-term memory Following Commands: Follows multi-step commands inconsistently   Awareness: Emergent Problem Solving: Difficulty sequencing;Requires verbal cues General Comments: Pt frequently requires instructions/information to be repeated consistently.  Pt appears anxious.  Requires min - mod A for sequencing and problem solving    General Comments       Exercises       Shoulder Instructions      Home Living Family/patient expects to be discharged to:: Skilled nursing facility                                 Additional Comments: lived with sister PTA      Prior Functioning/Environment Level of Independence: Needs assistance    ADL's / Homemaking Assistance Needed: Pt was requiring assist for all ADLs PTA due to fatigue             OT Problem List: Decreased strength;Decreased activity tolerance;Impaired balance (sitting and/or standing);Decreased cognition;Decreased safety awareness;Decreased knowledge of use of DME or AE;Cardiopulmonary status limiting activity;Obesity;Pain   OT Treatment/Interventions: Self-care/ADL training;Therapeutic exercise;DME and/or AE instruction;Therapeutic activities;Cognitive remediation/compensation;Patient/family education;Balance training;Energy conservation    OT Goals(Current goals can be found in the care plan section) Acute Rehab OT Goals Patient Stated Goal: to have less pain  OT Goal Formulation: With patient Time For Goal Achievement: 11/14/16 Potential to Achieve Goals: Fair ADL Goals Pt Will Perform Grooming: with supervision;sitting Pt Will Perform Upper Body Bathing: with supervision;sitting Pt Will Perform Lower Body Bathing: with mod assist;sit to/from stand;with adaptive equipment Pt Will Transfer to Toilet: with mod assist;stand pivot transfer;bedside commode Pt Will Perform Toileting -  Clothing Manipulation and hygiene: with mod assist;sit to/from stand Pt/caregiver will Perform Home Exercise Program: Increased strength;Both right and left upper extremity;With theraband;With Supervision;With written HEP provided  OT Frequency: Min 2X/week   Barriers to D/C: Decreased caregiver support          Co-evaluation PT/OT/SLP Co-Evaluation/Treatment: Yes Reason for Co-Treatment: Complexity of the patient's impairments (multi-system involvement);For patient/therapist safety;To address functional/ADL transfers   OT goals addressed during session: ADL's and self-care      End of Session Equipment Utilized During Treatment: Oxygen;Rolling walker Nurse Communication: Mobility status  Activity Tolerance: Patient limited by fatigue;Patient limited by pain Patient left: in bed;with call bell/phone within reach;with family/visitor present   Time: 1358-1430 OT Time Calculation (min): 32 min Charges:  OT General Charges $OT Visit: 1 Procedure OT Evaluation $OT Eval Moderate Complexity: 1 Procedure G-Codes:    [place] Fant M 11/12/2016, 2:58 PM

## 2016-10-31 NOTE — Progress Notes (Signed)
Physical Therapy Treatment Patient Details Name: [name] MRN: [id number] DOB: 11/21/1945 Today's Date: 10/31/2016    History of Present Illness 70 y.o. female admitted with fall. PMH of metastatic lung ca (mets to pelvis and hip), COPD, RA.     PT Comments    Pt cooperative but ltd this pm by fatigue/pain and fear of falling after recent episode.  Follow Up Recommendations  SNF;Supervision/Assistance - 24 hour     Equipment Recommendations       Recommendations for Other Services       Precautions / Restrictions Precautions Precautions: Fall Precaution Comments: monitor O2 Restrictions Weight Bearing Restrictions: No    Mobility  Bed Mobility Overal bed mobility: Needs Assistance Bed Mobility: Supine to Sit;Sit to Supine     Supine to sit: Mod assist;HOB elevated Sit to supine: Max assist;+2 for physical assistance   General bed mobility comments: Pt requirse assist to move LEs off bed and to lift shoulders.  She fatigued during session and required assist with all aspects to return to supine   Transfers Overall transfer level: Needs assistance Equipment used: Rolling walker (2 wheeled) Transfers: Sit to/from Stand Sit to Stand: Mod assist;+2 physical assistance         General transfer comment: Pt requires cues for hand placement, height of bed to be elvated, and assist to lift buttocks from surface and to extend hips.    Ambulation/Gait             General Gait Details: Pt stood only for ~1 minute but refused to attempt stepping 2* c/o dizziness/fatigue/pain "I need to sit right now"   Stairs            Wheelchair Mobility    Modified Rankin (Stroke Patients Only)       Balance Overall balance assessment: Needs assistance Sitting-balance support: Feet supported;Single extremity supported Sitting balance-Leahy Scale: Fair     Standing balance support: Bilateral upper extremity supported Standing balance-Leahy Scale: Poor Standing  balance comment: Pt required min A (+2 for safety) to maintain standing ~90 seconds before complaining of dizziness and needing to sit.  BP was stable                     Cognition Arousal/Alertness: Awake/alert Behavior During Therapy: Anxious;WFL for tasks assessed/performed Overall Cognitive Status: Impaired/Different from baseline Area of Impairment: Attention;Memory;Following commands;Awareness;Problem solving   Current Attention Level: Selective Memory: Decreased short-term memory Following Commands: Follows multi-step commands inconsistently   Awareness: Emergent Problem Solving: Difficulty sequencing;Requires verbal cues General Comments: Pt frequently requires instructions/information to be repeated consistently.  Pt appears anxious.  Requires min - mod A for sequencing and problem solving     Exercises      General Comments General comments (skin integrity, edema, etc.): 02 sats 90% to 93% on 6l supplemental 02       Pertinent Vitals/Pain Pain Assessment: Faces Pain Score: 0-No pain Faces Pain Scale: Hurts little more Pain Location: back, generalized  Pain Descriptors / Indicators: Grimacing;Guarding Pain Intervention(s): Monitored during session;Limited activity within patient's tolerance;Repositioned    Home Living Family/patient expects to be discharged to:: Skilled nursing facility               Additional Comments: lived with sister PTA    Prior Function Level of Independence: Needs assistance    ADL's / Homemaking Assistance Needed: Pt was requiring assist for all ADLs PTA due to fatigue      PT Goals (current goals can  now be found in the care plan section) Acute Rehab PT Goals Patient Stated Goal: to have less pain  PT Goal Formulation: With patient Time For Goal Achievement: 11/12/16 Potential to Achieve Goals: Fair Progress towards PT goals: Progressing toward goals    Frequency    Min 3X/week      PT Plan Current plan remains  appropriate    Co-evaluation PT/OT/SLP Co-Evaluation/Treatment: Yes Reason for Co-Treatment: Complexity of the patient's impairments (multi-system involvement);For patient/therapist safety PT goals addressed during session: Mobility/safety with mobility OT goals addressed during session: ADL's and self-care     End of Session Equipment Utilized During Treatment: Oxygen Activity Tolerance: Patient limited by fatigue Patient left: in bed;with call bell/phone within reach;with family/visitor present;with bed alarm set     Time: 1405-1430 PT Time Calculation (min) (ACUTE ONLY): 25 min  Charges:  $Therapeutic Activity: 8-22 mins                    G Codes:      Latravion Graves 11-27-2016, 3:46 PM

## 2016-10-31 NOTE — Progress Notes (Signed)
CSW assisting with d/c planning. SNF bed offers have been provided to pt's sister. Family will review options. CSW will assist with d/c planning to SNF.  Werner Lean LCSW [phone]

## 2016-10-31 NOTE — Progress Notes (Addendum)
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  SUBJECTIVE:  Didn't sleep well Better compared to admission, but still ill Coughed up a scant amount of mucus yesterday  VITAL SIGNS: BP (!) 170/75 (BP Location: Right Arm)   Pulse 100   Temp 98.2 F (36.8 C)   Resp (!) 27   Ht '5\' 6"'$  (1.676 m)   Wt 99.6 kg (219 lb 9.3 oz)   SpO2 95%   BMI 35.44 kg/m   HEMODYNAMICS:    VENTILATOR SETTINGS: Vent Mode: PCV FiO2 (%):  [50 %] 50 % Set Rate:  [12 bmp] 12 bmp PEEP:  [6 cmH20] 6 cmH20  INTAKE / OUTPUT: I/O last 3 completed shifts: In: 6019.2 [P.O.:300; I.V.:5069.2; IV Piggyback:650] Out: 1400 [Urine:1400]  PHYSICAL EXAMINATION: General:  Sleepy but arouses HENT: NCAT OP clear PULM: CTA B, normal effort CV: RRR, no mgr GI: BS+ soft, nontender MSK: normal bulk and tone Neuro: Awake an alert, no distress  LABS:  BMET  Recent Labs Lab 10/29/16 0430 10/29/16 0908 10/30/16 0055  NA 143 142 145  K 3.9 3.9 4.0  CL 103 104 108  CO2 '31 30 29  '$ BUN 22* 22* 19  CREATININE 0.85 0.72 0.55  GLUCOSE 89 92 168*    Electrolytes  Recent Labs Lab 10/29/16 0430 10/29/16 0908 10/30/16 0055  CALCIUM 9.4 9.2 9.0    CBC  Recent Labs Lab 10/31/2016 1822 10/29/16 0908 10/30/16 0055  WBC 14.0* 11.2* 7.8  HGB 9.5* 9.5* 9.5*  HCT 31.4* 32.0* 32.0*  PLT 295 298 279    Coag's No results for input(s): APTT, INR in the last 168 hours.  Sepsis Markers  Recent Labs Lab 10/23/2016 2003 10/29/16 0908 10/29/16 1248  LATICACIDVEN 1.1 1.2 1.0  PROCALCITON  --  0.21  --     ABG  Recent Labs Lab  10/29/16 0930 10/30/16 0915  PHART 7.336* 7.435  PCO2ART 58.8* 42.1  PO2ART 59.2* 65.2*    Liver Enzymes  Recent Labs Lab 10/31/2016 1448 10/29/16 0908 10/30/16 0055  AST 41 40 42*  ALT '25 23 25  '$ ALKPHOS 108 94 91  BILITOT 1.1 0.8 0.9  ALBUMIN 2.5* 2.2* 2.3*    Cardiac Enzymes  Recent Labs Lab 10/13/2016 1822 10/16/2016 2249 10/29/16 0430  TROPONINI 0.03* 0.03* 0.03*    Glucose No results for input(s): GLUCAP in the last 168 hours.  Imaging No results found.  STUDIES:  CXR 12/26 > bibasal consolidation. Right greater than left effusion  CTA 12/26 > Small B/L PE with no right heart strain, lung masses and effusions are unchanged All images reviewed  CULTURES: Bcx X 2 12/26:  ANTIBIOTICS: Vancomycin 12/26 > off Cefepime 12/26 > 12/28  SIGNIFICANT EVENTS:  LINES/TUBES:  DISCUSSION: 5 year with newly diagnosed stage IV adenocarcinoma of the lung with COPD. Now with acute hypercarbic respiratory failure with CTA showing PE Improving.  No clear evidence of HCAP with lack of fever.   Has malignant effusion, doesn't appear to have changed this admission  ASSESSMENT / PLAN: Stage IV lung cancer COPD Hypercarbic resp failiure with AECOP, PE HCAP unlikely with normal Pct and  LA Rheumatoid arthritis (on chronic pred 10 mg)  - Use Bipap PRN - Heparin anticoagulation > will change to lovenox as preferred in malignancy - Pulmicort nebs, duonebs - IV solumedrol > would change to prednisone tomorrow - will d/c antibiotics - repeat CXR if dyspnea worsens  PCCM available PRN  FAMILY  - Updates: updated sister bedside 12/28 - Inter-disciplinary family meet or Palliative Care meeting due by:  11/05/16  Roselie Awkward, MD [facility] Pager: [phone] Cell: [phone] After 3pm or if no response, call [phone]  10/31/2016, 12:19 PM

## 2016-10-31 NOTE — Progress Notes (Signed)
RT attempted to place PT on BiPAP - unable to tolerate at this time. PT states she feels anxious (RN aware).

## 2016-10-31 NOTE — Progress Notes (Signed)
ANTICOAGULATION CONSULT NOTE - Follow up [facility] for Lovenox Indication: pulmonary embolus  No Known Allergies  Patient Measurements: Height: '5\' 6"'$  (167.6 cm) Weight: 219 lb 9.3 oz (99.6 kg) IBW/kg (Calculated) : 59.3 Heparin Dosing Weight: 81 kg  Vital Signs: Temp: 98.6 F (37 C) (12/28 1200) Temp Source: Oral (12/28 0400) BP: 170/75 (12/28 1024) Pulse Rate: 100 (12/28 1100)  Labs:  Recent Labs  10/27/2016 1822 10/22/2016 2249 10/29/16 0430 10/29/16 0908 10/30/16 0055 10/30/16 0931 10/31/16 0340  HGB 9.5*  --   --  9.5* 9.5*  --   --   HCT 31.4*  --   --  32.0* 32.0*  --   --   PLT 295  --   --  298 279  --   --   HEPARINUNFRC  --   --   --   --  0.44 0.44 0.63  CREATININE 0.64  --  0.85 0.72 0.55  --   --   TROPONINI 0.03* 0.03* 0.03*  --   --   --   --     Estimated Creatinine Clearance: 75.7 mL/min (by C-G formula based on SCr of 0.55 mg/dL).   Medical History: Past Medical History:  Diagnosis Date  . Back pain 10/24/2016  . Cancer [facility])    right breast cancer  . COPD (chronic obstructive pulmonary disease) ([place])   . Encounter for antineoplastic chemotherapy 10/24/2016  . Goals of care, counseling/discussion 10/24/2016  . Oxygen dependent     Assessment: 70 yr female admitted on 12/25 with weakness.  PMH significant for recent diagnosis of NSCLC and was to begin radiation/palliative chemo this week.  CTAngio shows + PE.  Pharmacy was initially consulted to dose Heparin IV, now changed to Lovenox (preferred anticoagulant d/t cancer diagnosis).  Today, 10/31/2016:  Heparin level 0.63 remains therapeutic on heparin at 1300 units/hr  CBC: Hgb 9.5 is low/stable, and Plt remain WNL (last on 12/27)  No bleeding or complications reported.  SCr 0.55, CrCl ~ 75 ml/min   Goal of Therapy:  Monitor platelets by anticoagulation protocol: Yes   Plan:  D/C Heparin gtt at 12:30 Lovenox '100mg'$  SQ BID, first dose 1 hour after heparin d/c. Dosage  remains stable and need for further dosage adjustment appears unlikely at present.  Pharmacy will sign off at this time.  Please reconsult if a change in clinical status warrants re-evaluation of dosage.  Monitor renal function and CBC peripherally.   Gretta Arab PharmD, BCPS Pager [phone] 10/31/2016 12:32 PM

## 2016-11-01 ENCOUNTER — Inpatient Hospital Stay (HOSPITAL_COMMUNITY): Payer: Medicare Other

## 2016-11-01 LAB — CBC
HEMATOCRIT: 30.4 % — AB (ref 36.0–46.0)
HEMOGLOBIN: 9.4 g/dL — AB (ref 12.0–15.0)
MCH: 26.4 pg (ref 26.0–34.0)
MCHC: 30.9 g/dL (ref 30.0–36.0)
MCV: 85.4 fL (ref 78.0–100.0)
Platelets: 363 10*3/uL (ref 150–400)
RBC: 3.56 MIL/uL — ABNORMAL LOW (ref 3.87–5.11)
RDW: 16.7 % — AB (ref 11.5–15.5)
WBC: 14.5 10*3/uL — ABNORMAL HIGH (ref 4.0–10.5)

## 2016-11-01 MED ORDER — FUROSEMIDE 10 MG/ML IJ SOLN: 40.0000 mg | Freq: Once | INTRAMUSCULAR | Status: DC

## 2016-11-01 MED ORDER — FUROSEMIDE 10 MG/ML IJ SOLN
40.0000 mg | Freq: Two times a day (BID) | INTRAMUSCULAR | Status: AC
  Administered 2016-11-01 (×2): 40 mg via INTRAVENOUS
  Filled 2016-11-01 (×2): qty 4

## 2016-11-01 MED ORDER — LORAZEPAM 2 MG/ML IJ SOLN
0.5000 mg | Freq: Once | INTRAMUSCULAR | Status: AC
  Administered 2016-11-01: 0.5 mg via INTRAVENOUS
  Filled 2016-11-01: qty 1

## 2016-11-01 MED ORDER — CLONAZEPAM 0.5 MG PO TABS
0.5000 mg | ORAL_TABLET | Freq: Two times a day (BID) | ORAL | Status: DC | PRN
  Administered 2016-11-03 (×2): 0.5 mg via ORAL
  Filled 2016-11-01 (×2): qty 1

## 2016-11-01 MED ORDER — OXYCODONE HCL 5 MG PO TABS
5.0000 mg | ORAL_TABLET | ORAL | Status: DC | PRN
  Administered 2016-11-01 – 2016-11-02 (×5): 5 mg via ORAL
  Filled 2016-11-01 (×5): qty 1

## 2016-11-01 MED ORDER — OXYCODONE HCL 5 MG PO TABS: 5.0000 mg | ORAL_TABLET | ORAL | Status: DC | PRN

## 2016-11-01 MED ORDER — LEVALBUTEROL HCL 1.25 MG/0.5ML IN NEBU
1.2500 mg | INHALATION_SOLUTION | Freq: Four times a day (QID) | RESPIRATORY_TRACT | Status: DC | PRN
  Filled 2016-11-01: qty 0.5

## 2016-11-01 MED ORDER — VITAMINS A & D EX OINT
TOPICAL_OINTMENT | CUTANEOUS | Status: AC
  Administered 2016-11-01: 1
  Filled 2016-11-01: qty 5

## 2016-11-01 NOTE — Progress Notes (Signed)
Patient remains off of NIV at this time with no complications. NIV on standby if needed. Will continue to monitor pt.

## 2016-11-01 NOTE — Progress Notes (Addendum)
[facility] PROGRESS NOTE  [name] YFV:[id number] DOB: 02-10-1946 DOA: 11/03/2016   PCP: Merrilee Seashore, MD     Assessment/Plan: Principal Problem:   Weakness Active Problems:   COPD GOLD II   Essential hypertension   Hypokalemia   Goals of care, counseling/discussion   Back pain   Non-small cell carcinoma of lung ([place])   Palliative care by specialist   Malignant neoplasm of lung ([place])   Acute respiratory failure (HCC)   SOB (shortness of breath)   Protein-calorie malnutrition, severe   Pulmonary embolus ([place])    70 y.o. 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  Patient states that she fell on the way to the bathroom.  Patients roommate and her sisters daughter attempted to get her up but were unsuccessful. Patient evaluated by ED physician.  Potassium slightly low at 3.2, Troponin slightly elevated, WBC increased at 14.6.  Patient is tachypneic, tachycardic, afebrile, lactic acid was not ordered.  TRH was asked to admit for dyspnea, and generalized weakness. Patient was scheduled to start post chemotherapy 12/26  Assessment and plan Sepsis likely secondary to pneumonia Met sepsis criteria Tachycardic tachypneic, febrile, improved  Focused sepsis protocol initiated-lactic acid 1.2, Pro calcitonin 0.21,  patient on broad-spectrum antibiotics, dc vanc as BC NGSF, taper steroids , change to prednisone in am  Notified oncology-Dr. Julien Nordmann 12/25 Initial chest x-ray did not show any significant changes, suspected lymphangitic spread CT scan 12/12 showed multiple small pockets of loculated pleural fluid within the right hemithorax/pulmonary nodules right upper and right middle lobe. CT PE protocol showed subsegmental PE Due to tachypnea patient placed on BiPAP, now off on 5L  [place] Discussed CODE STATUS would like to be a full code Appreciate critical care input ABG noted-improving Continue step down, still tenuous , repeat CXR , lasix to see if diuresis improves oxygen requirements Change albuterol to xopenex due to tachycardia   ACUTE PE Nonocclusive Continue  heparin gtt, need to make a decision about Lovenox vs NOAC at DC   Weakness likely secondary to above   PT/OT consult - SW consult as patient will likely not be able to return back to previous apartment - progressively worsening Discontinue sedating medications, much more awake    Anorexia - nutrition consult placed  Non- small cell carcinoma of the lung - supposed to go for radiation mapping , prohibited because of pulmonary status, now improving , attempt simulation today - patient postponed port placement due to weakness this past week - per notes from Dr. Julien Nordmann of oncology chemotherapy is palliative only, notified Dr. Julien Nordmann that patient is being moved to stepdown, and would like to be a full code - palliative care consulted   Hypokalemia Repleted - will repeat BMP in am   insomnia and anxiety Will start low dose klonopin, unable to sleep and night and very anxious today  Back Pain - secondary to metastases Hold MS Contin given somnolence on arrival PRN morphine and oxycodone for breakthrugh pain      DVT prophylaxsis heparin gtt  Code Status:  Full code    Family Communication: Discussed in detail with the patient/neice, all imaging results, lab results explained to the patient   Disposition Plan:  Continue stepdown      Consultants:  Called Dr. Julien Nordmann 12/26  Critical  care  Procedures:  None  Antibiotics: Anti-infectives    Start     Dose/Rate Route Frequency Ordered Stop   10/29/16 2200  vancomycin (VANCOCIN) IVPB 750 mg/150 ml premix  Status:  Discontinued     750 mg 150 mL/hr over 60 Minutes Intravenous Every 12 hours 10/29/16 0825 10/31/16  1058   10/29/16 1700  ceFEPIme (MAXIPIME) 1 g in dextrose 5 % 50 mL IVPB  Status:  Discontinued     1 g 100 mL/hr over 30 Minutes Intravenous Every 8 hours 10/29/16 0825 10/31/16 1218   10/29/16 1000  vancomycin (VANCOCIN) 1,500 mg in sodium chloride 0.9 % 500 mL IVPB     1,500 mg 250 mL/hr over 120 Minutes Intravenous  Once 10/29/16 0825 10/29/16 1218   10/29/16 0830  ceFEPIme (MAXIPIME) 1 g in dextrose 5 % 50 mL IVPB     1 g 100 mL/hr over 30 Minutes Intravenous NOW 10/29/16 0825 10/29/16 1003         HPI/Subjective: On 5L [place] , sob today , anxious and tearful  Objective: Vitals:   11/01/16 0735 11/01/16 0800 11/01/16 0827 11/01/16 0940  BP: (!) 183/84     Pulse: 89  (!) 106   Resp: (!) 30  (!) 27   Temp:  97.1 F (36.2 C)    TempSrc:  Axillary    SpO2: 96%  92% 92%  Weight:      Height:        Intake/Output Summary (Last 24 hours) at 11/01/16 1126 Last data filed at 11/01/16 1100  Gross per 24 hour  Intake           3074.1 ml  Output             2575 ml  Net            499.1 ml    Exam:  Examination:  General exam: Appears calm and comfortable  Respiratory system: Clear to auscultation. Respiratory effort normal. Cardiovascular system: S1 & S2 heard, RRR. No JVD, murmurs, rubs, gallops or clicks. No pedal edema. Gastrointestinal system: Abdomen is nondistended, soft and nontender. No organomegaly or masses felt. Normal bowel sounds heard. Central nervous system: Alert and oriented. No focal neurological deficits. Extremities: Symmetric 5 x 5 power. Skin: No rashes, lesions or ulcers Psychiatry: Judgement and insight appear normal. Mood & affect appropriate.     Data Reviewed: I have personally reviewed following labs and imaging studies  Micro Results Recent Results (from the past 240 hour(s))  Urine culture     Status: Abnormal   Collection Time: 10/27/2016  4:29 PM  Result Value Ref Range Status   Specimen Description URINE, RANDOM  Final   Special  Requests NONE  Final   Culture (A)  Final    <10,000 COLONIES/mL INSIGNIFICANT GROWTH Performed at [facility]    Report Status 10/29/2016 FINAL  Final  Culture, blood (routine x 2)     Status: None (Preliminary result)   Collection Time: 10/29/16  9:08 AM  Result Value Ref Range Status   Specimen Description BLOOD RIGHT ARM  Final   Special Requests BOTTLES DRAWN AEROBIC AND ANAEROBIC 5CC  Final   Culture   Final    NO GROWTH 2 DAYS Performed at [facility]    Report Status PENDING  Incomplete  Culture, blood (routine x 2)     Status: None (Preliminary result)   Collection Time: 10/29/16  9:11 AM  Result Value Ref Range Status  Specimen Description BLOOD RIGHT HAND  Final   Special Requests BOTTLES DRAWN AEROBIC AND ANAEROBIC 5CC  Final   Culture   Final    NO GROWTH 2 DAYS Performed at [facility]    Report Status PENDING  Incomplete    Radiology Reports Dg Chest 2 View  Result Date: 10/07/2016 CLINICAL DATA:  Shortness of breath.  History COPD.  Former smoker. EXAM: CHEST  2 VIEW COMPARISON:  PET-CT 10/22/2016 FINDINGS: Cardiomediastinal silhouette is normal. Mediastinal contours appear intact. Calcific atherosclerotic disease of the aorta seen. There is no evidence of pneumothorax. Nodular densities overlying the right hemithorax likely represent loculated pleural effusion. Malignant appearing nodules demonstrated by recent PET-CT in the right upper and right middle lobe are poorly visualized. Mild interstitial thickening on the right. Osseous structures are without acute abnormality. Soft tissues are grossly normal. IMPRESSION: No significant change in findings suggestive of right lung malignancy with potential lymphangitic spread of disease. Electronically Signed   By: Fidela Salisbury M.D.   On: 10/31/2016 15:04   Dg Chest 2 View  Result Date: 10/16/2016 CLINICAL DATA:  Follow-up pleural effusion . EXAM: CHEST  2 VIEW COMPARISON:  CT 10/14/2016.   Chest x-ray 10/14/2016 09/25/2016. FINDINGS: Mediastinum and hilar structures are normal. Heart size normal. Right lower lobe atelectasis and infiltrate. Small right pleural effusion. No acute bony abnormality identified. IMPRESSION: Right lower lobe atelectasis and infiltrate again noted. With small right pleural effusion noted. Similar findings noted on prior exam. No pneumothorax. Electronically Signed   By: Marcello Moores  Register   On: 10/16/2016 10:32   Dg Chest 2 View  Result Date: 10/14/2016 CLINICAL DATA:  Left-sided breast pain and shortness of breath. History of lung cancer. EXAM: CHEST  2 VIEW COMPARISON:  Chest radiograph 10/07/2016 and chest CT 09/24/2016 FINDINGS: Cardiomediastinal contours are unchanged. Small right pleural effusion with loculated component along the right lateral hemithorax is slightly decreased in size. Multiple nodular opacities overlying the right lung are unchanged. There is atherosclerotic calcification within the aortic arch. No pneumothorax, focal airspace consolidation or pulmonary edema. IMPRESSION: 1. No acute cardiopulmonary disease. 2. Decreased size of partially loculated right pleural effusion and unchanged multifocal nodular opacities of the right hemithorax. 3. Aortic atherosclerosis. Electronically Signed   By: Ulyses Jarred M.D.   On: 10/14/2016 14:12   Dg Chest 2 View  Result Date: 10/07/2016 CLINICAL DATA:  Recurrent right pleural and fusion associated with shortness of breath and chest pain. History of COPD, former smoker, diabetes. Patient also has a history of right-sided breast malignancy. EXAM: CHEST  2 VIEW COMPARISON:  Chest x-rays of October 04, 2016, September 24, 2016, and CT scan of the chest of September 24, 2016. FINDINGS: The left lung is mildly hyperinflated. There is no left-sided pleural effusion. On the right there is a small pleural effusion which has increased slightly in size over the past 3 days. There is no pneumothorax. There is stable  ovoid density projecting over the posterior aspect of the right seventh rib and patchy interstitial density more inferiorly. Is calcification in the wall of the aortic arch. The heart and pulmonary vascularity are normal. The bony thorax exhibits no acute abnormality. The heart and pulmonary vascularity are normal. The mediastinum is normal in width. IMPRESSION: Slight interval increase in the volume of the small right pleural effusion. Stable underlying chronic bronchitic changes. Patchy density in the right mid lung is stable. Thoracic aortic atherosclerosis. Electronically Signed   By: David  Martinique M.D.  On: 10/07/2016 09:41   Dg Chest 2 View  Result Date: 10/04/2016 CLINICAL DATA:  Shortness of breath.  Ex-smoker. EXAM: CHEST  2 VIEW COMPARISON:  10/01/2016. FINDINGS: Increasing RIGHT pleural effusion. Compressive atelectasis at the RIGHT base but no definite consolidation. Trace LEFT pleural effusion. No pneumothorax. Normal heart size. IMPRESSION: Beginning re-accumulation of RIGHT pleural effusion post thoracentesis. Electronically Signed   By: Staci Righter M.D.   On: 10/04/2016 10:28   Ct Angio Chest Pe W Or Wo Contrast  Result Date: 10/29/2016 CLINICAL DATA:  COPD with newly diagnosed lung cancer. EXAM: CT ANGIOGRAPHY CHEST WITH CONTRAST TECHNIQUE: Multidetector CT imaging of the chest was performed using the standard protocol during bolus administration of intravenous contrast. Multiplanar CT image reconstructions and MIPs were obtained to evaluate the vascular anatomy. CONTRAST:  90 cc Isovue 370 COMPARISON:  PET-CT 10/22/2016.  Chest CT 10/14/2016. FINDINGS: Cardiovascular: Heart size upper normal. Coronary artery calcification is noted. Atherosclerotic calcification is noted in the wall of the thoracic aorta.String like small volume pulmonary embolus is identified in the left main pulmonary artery. There is borderline occlusive pulmonary embolus identified and subsegmental right lower lobe  pulmonary arteries (see image 68 series 4). No evidence for right heart strain. Mediastinum/Nodes: Stable 8 mm short axis subcarinal lymph node. Stable 14 mm short axis right hilar lymph node. Lungs/Pleura: Loculated right pleural effusion with loculations in the right fissures, similar to prior. 9 mm right middle lobe pulmonary nodule is stable. 18 mm spiculated subpleural anterior right upper lobe pulmonary nodule, seen to be hypermetabolic on recent PET-CT also stable since previous. Fine detail a both lungs obscured by patient breathing motion. Upper Abdomen: Multiple liver lesions are stable, seen to be hypermetabolic on recent PET scan. No change 18 mm left adrenal nodule. Musculoskeletal: Multiple lytic thoracolumbar spine lesions consistent with metastatic disease, as described previously. Review of the MIP images confirms the above findings. IMPRESSION: 1. Small volume pulmonary embolus, nonocclusive left main pulmonary artery and involving subsegmental pulmonary artery to the right lower lobe. No evidence for right heart strain. 2. Otherwise stable exam with no change in the right pulmonary nodules, mediastinal and right hilar lymphadenopathy and loculated pleural fluid collection/disease. 3. Multiple liver lesions, seen to be hypermetabolic on previous PET imaging. 4. Stable left adrenal nodule 5. Bony metastatic involvement. I discussed these findings by telephone with Dr. Allyson Sabal at 1520 hours on 10/29/2016. Electronically Signed   By: Misty Stanley M.D.   On: 10/29/2016 15:24   Ct Angio Chest Pe W Or Wo Contrast  Result Date: 10/14/2016 CLINICAL DATA:  Short of breath. Pleural drainage catheter removed 1 week prior. EXAM: CT ANGIOGRAPHY CHEST WITH CONTRAST TECHNIQUE: Multidetector CT imaging of the chest was performed using the standard protocol during bolus administration of intravenous contrast. Multiplanar CT image reconstructions and MIPs were obtained to evaluate the vascular anatomy. CONTRAST:   70 mL Isovue COMPARISON:  CT 09/24/2016 FINDINGS: Cardiovascular: Coronary artery calcification and aortic atherosclerotic calcification. No pericardial fluid. Mediastinum/Nodes: No axillary or supraclavicular adenopathy. No mediastinal adenopathy. Lungs/Pleura: There are multiple small pockets of loculated pleural fluid along the posterior aspect of the RIGHT hemithorax. Interval reduction of moderate dependent effusion effusion seen on comparison exam. Some fluid extends along the fissure. No pneumothorax. Rounded nodule in the anterior RIGHT middle lobe measuring 9 mm (image 63, series 11. Nodule in the anterior RIGHT upper lobe additionally measuring 16 mm on image 44, series. These 2 pulmonary nodules are not changed comparison exam. LEFT lung  clear Upper Abdomen: Limited view of the liver, kidneys, pancreas are unremarkable. Normal adrenal glands. Musculoskeletal: No aggressive osseous lesion. Review of the MIP images confirms the above findings. IMPRESSION: 1. Multiple small pockets of loculated pleural fluid within the RIGHT hemithorax replaces the previous seen moderate pleural effusion. 2. Stable pulmonary nodules the RIGHT upper lobe and RIGHT middle lobe of undetermined etiology. 3. No pneumothorax Electronically Signed   By: Suzy Bouchard M.D.   On: 10/14/2016 17:00   Mr Jeri Cos WH Contrast  Result Date: 10/04/2016 CLINICAL DATA:  New diagnosis of lung cancer. History of breast cancer, hypertension rheumatoid arthritis. Assess for intracranial metastasis. EXAM: MRI HEAD WITHOUT AND WITH CONTRAST TECHNIQUE: Multiplanar, multiecho pulse sequences of the brain and surrounding structures were obtained without and with intravenous contrast. CONTRAST:  82m MULTIHANCE GADOBENATE DIMEGLUMINE 529 MG/ML IV SOLN COMPARISON:  None. FINDINGS: Mild motion degraded examination. INTRACRANIAL CONTENTS: No reduced diffusion to suggest acute ischemia or hypercellular tumor. No susceptibility artifact to suggest  hemorrhage. The ventricles and sulci are normal for patient's age. Patchy supratentorial white matter FLAIR T2 hyperintensities are less than expected for age, compatible with chronic small vessel ischemic disease. No suspicious parenchymal signal, masses, mass effect. No abnormal intraparenchymal or extra-axial enhancement. No abnormal extra-axial fluid collections. No extra-axial masses. VASCULAR: Normal major intracranial vascular flow voids present at skull base. SKULL AND UPPER CERVICAL SPINE: No abnormal sellar expansion. No suspicious calvarial bone marrow signal. Craniocervical junction maintained. Small RIGHT exostosis. SINUSES/ORBITS: RIGHT maxillary mucosal retention cyst. Small RIGHT mastoid effusion.The included ocular globes and orbital contents are non-suspicious. OTHER: None. IMPRESSION: No intracranial metastasis. Negative motion degraded MRI head with and without contrast for age. Electronically Signed   By: CElon AlasM.D.   On: 10/04/2016 18:35   Ir Guided DNiel HummerW Catheter Placement  Result Date: 10/07/2016 CLINICAL DATA:  History of lung cancer, now with recurrent symptomatic right-sided malignant effusion. Please perform tunneled right-sided pleural drainage catheter for palliative purposes. EXAM: INSERTION OF TUNNELED RIGHT SIDED PLEURAL DRAINAGE CATHETER COMPARISON:  Ultrasound-guided thoracentesis - 10/01/2016; 09/25/2016; chest radiograph - 10/07/2016 MEDICATIONS: Ancef 2 gm IV; Antibiotic was administered in an appropriate time interval for the procedure. ANESTHESIA/SEDATION: Moderate (conscious) sedation was employed during this procedure. A total of Versed 2 mg and Fentanyl 50 mcg was administered intravenously. Moderate Sedation Time: 14 minutes. The patient's level of consciousness and vital signs were monitored continuously by radiology nursing throughout the procedure under my direct supervision. FLUOROSCOPY TIME:  FLUOROSCOPY TIME 36 seconds (267.5mGy) COMPLICATIONS: None  immediate. PROCEDURE: The procedure, risks, benefits, and alternatives were explained to the patient, who wish to proceed with the placement of this permanent pleural catheter as the patient is seeking palliative care. The patient understand and consent to the procedure. The right inferior lateral chest and upper abdomen were prepped with Chlorhexidine in a sterile fashion, and a sterile drape was applied covering the operative field. A sterile gown and sterile gloves were used for the procedure. Initial ultrasound scanning and fluoroscopic imaging demonstrates a recurrent moderate to large pleural effusion. Under direct ultrasound guidance, the right inferior lateral pleural space was accessed with a Yueh sheath needle after the overlying soft tissues were anesthetized with 1% lidocaine with epinephrine. An Amplatz super stiff wire was then advanced under fluoroscopy into the pleural space. A 15.5 French tunneled Pleur-X catheter was tunneled from an incision within the right upper abdominal quadrant to the access site. The pleural access site was serially dilated under fluoroscopy,  ultimately allowing placement of a peel-away sheath. The catheter was advanced through the peel-away sheath. The sheath was then removed. Final catheter positioning was confirmed with a fluoroscopic radiographic image. The access incision was closed with subcutaneous subcuticular 4-0 Vicryl, Dermabond and Steri-Strips. A Prolene retention suture was applied at the catheter exit site. Large volume thoracentesis was performed through the new catheter utilizing provided bulb vacuum assisted drainage bag. The patient tolerated the above procedure well without immediate postprocedural complication. FINDINGS: Preprocedural ultrasound scanning demonstrates a recurrent moderate sized right sided pleural effusion. After ultrasound and fluoroscopic guided placement, the catheter is coiled within the caudal aspect of the right hemi thorax at the  location of the patient's recurrent malignant effusion. Following catheter placement, approximately 1.3 L of serous though slightly blood tinged pleural fluid was removed. IMPRESSION: Successful placement of permanent, tunneled right pleural drainage catheter via lateral approach. Approximately 1.3 L of serous though slightly blood tinged pleural fluid was removed after catheter placement. Electronically Signed   By: Sandi Mariscal M.D.   On: 10/07/2016 16:42   Nm Pet Image Initial (pi) Skull Base To Thigh  Result Date: 10/22/2016 CLINICAL DATA:  Initial treatment strategy for right lung adenocarcinoma. EXAM: NUCLEAR MEDICINE PET SKULL BASE TO THIGH TECHNIQUE: 11.4 mCi F-18 FDG was injected intravenously. Full-ring PET imaging was performed from the skull base to thigh after the radiotracer. CT data was obtained and used for attenuation correction and anatomic localization. FASTING BLOOD GLUCOSE:  Value: 160 mg/dl COMPARISON:  Chest CTA on 10/14/2016 FINDINGS: NECK No hypermetabolic lymph nodes in the neck. CHEST Small multiloculated right pleural effusion shows diffuse hypermetabolic activity, consistent with malignant pleural effusion. A spiculated nodule in the anterior right upper lobe abutting the pleural surface on image 54/0 is hypermetabolic, with SUV max of 12.2. A 10 mm smoothly marginated pulmonary nodule in the right middle lobe on image 48/7 shows mild hypermetabolism with SUV max of 4.4. No suspicious pulmonary nodule seen in the left lung. 8 mm subcarinal lymph node on image 98/1 is hypermetabolic with SUV max of 8.3. Hypermetabolic activity also seen in right hilum with SUV max of 12.2 ABDOMEN/PELVIS Multiple small hypermetabolic lesions are seen throughout the right and left hepatic lobes, consistent with diffuse liver metastases. 1.8 cm left adrenal mass is hypermetabolic with SUV max of 19.1, consistent with left adrenal metastasis. No other hypermetabolic masses or lymphadenopathy identified  within the abdomen or pelvis. Colonic diverticulosis is noted, without evidence of diverticulitis. Previous hysterectomy. SKELETON Diffuse hypermetabolic lytic bone metastases are seen throughout the spine, bilateral ribs, bilateral scapulae and right humeral head, pelvis, and bilateral hips, consistent with diffuse bone metastases. Several pathologic right rib fractures are noted. IMPRESSION: 16 mm hypermetabolic spiculated nodule in the anterior right upper lobe, and 10 mm hypermetabolic smoothly marginated nodule in right middle lobe, which could represent primary bronchogenic carcinoma or pulmonary metastases. Small malignant multiloculated right pleural effusion. Small hypermetabolic right hilar and subcarinal mediastinal lymph nodes, consistent with metastatic disease. Diffuse liver metastases. Small left adrenal metastasis. Diffuse lytic bone metastases. Several pathologic right rib fractures also noted. Electronically Signed   By: Earle Gell M.D.   On: 10/22/2016 11:53   Dg Chest Port 1 View  Result Date: 10/29/2016 CLINICAL DATA:  Fever.  Unresponsive patient. EXAM: PORTABLE CHEST 1 VIEW COMPARISON:  Chest x-ray 10/21/2016.  Chest CT 10/14/2016 FINDINGS: 0813 hours. The cardio pericardial silhouette is enlarged. Bibasilar atelectasis/ infiltrate. Right pleural effusion with apparent loculation. The visualized bony structures of  the thorax are intact. Telemetry leads overlie the chest. IMPRESSION: Cardiomegaly with bibasilar collapse/ consolidation and right greater than left pleural effusion, likely loculated. Electronically Signed   By: Misty Stanley M.D.   On: 10/29/2016 08:42   Dg Hip Unilat With Pelvis 2-3 Views Left  Result Date: 10/29/2016 CLINICAL DATA:  Hip pain.  Fall. EXAM: DG HIP (WITH OR WITHOUT PELVIS) 2-3V LEFT COMPARISON:  05/07/2011 . FINDINGS: Diffuse osteopenia degenerative change. No evidence of fracture or dislocation. Surgical clips in the pelvis. Contrast from prior CT  noticed in the bladder. IMPRESSION: Degenerative changes lumbar spine and both hips. No acute abnormality identified. No evidence of fracture or dislocation. Electronically Signed   By: Marcello Moores  Register   On: 10/29/2016 15:37   Ir Removal Of Plural Cath W/cuff  Result Date: 10/10/2016 INDICATION: 70 year old female with a history of malignant right-sided pleural effusion and PleurX catheter placed 10/07/2016. She presents today for malfunctioning catheter. EXAM: IR REMOVAL OF PLEURAL CATH WITH CUFF MEDICATIONS: The patient is currently admitted to the hospital and receiving intravenous antibiotics. The antibiotics were administered within an appropriate time frame prior to the initiation of the procedure. ANESTHESIA/SEDATION: None COMPLICATIONS: None PROCEDURE: Informed written consent was obtained from the patient after a thorough discussion of the procedural risks, benefits and alternatives. All questions were addressed. Maximal Sterile Barrier Technique was utilized including caps, mask, sterile gowns, sterile gloves, sterile drape, hand hygiene and skin antiseptic. A timeout was performed prior to the initiation of the procedure. Patient was positioned supine position on the fluoroscopy table. 1% lidocaine was used for local anesthesia. Spot images of the chest were performed. Contrast injection was performed of the catheter. Glidewire was advanced through the catheter in attempt to reposition. Retention suture was ligated in order to Dermabond the skin. Catheter was damaged during the manipulation requiring attempt at exchange. Stiff wire was advanced through the catheter which was removed from the stiff wire. Combination of the peel-away sheath, Kumpe catheter were used in order to stiff in the tract an allow passage of the new catheter over the wiring catheter combination. Exchange of the catheter failed, with inability to passed through the soft tissue tract and enter the pleura. Catheter was removed and  dressing was placed. Patient tolerated the procedure well and remained hemodynamically stable throughout. No complications were encountered and no significant blood loss encountered. FINDINGS: Initial image demonstrates catheter coiled at the base of the lung, potentially subpulmonic. Contrast injection demonstrates contrast accumulating within the pleural is a space. Wire manipulation failed to manipulate the catheter to more superior position. Images demonstrate failed catheter exchange given the damaged catheter. IMPRESSION: Status post failed attempt at exchange of a tunneled right chest PleurX catheter. Signed, Dulcy Fanny. Earleen Newport, DO [facility] Radiology PLAN: The patient will return for a scheduled visit at the [facility] with the chest x-ray to determine need for new PleurX catheter placement versus potential repeat thoracentesis preceding her oncology plan. Electronically Signed   By: Corrie Mckusick D.O.   On: 10/10/2016 16:52     CBC  Recent Labs Lab 10/10/2016 1448 11/01/2016 1822 10/29/16 0908 10/30/16 0055 11/01/16 0314  WBC 14.6* 14.0* 11.2* 7.8 14.5*  HGB 10.6* 9.5* 9.5* 9.5* 9.4*  HCT 34.4* 31.4* 32.0* 32.0* 30.4*  PLT 302 295 298 279 363  MCV 86.0 87.0 89.6 88.4 85.4  MCH 26.5 26.3 26.6 26.2 26.4  MCHC 30.8 30.3 29.7* 29.7* 30.9  RDW 16.7* 17.0* 17.5* 17.1* 16.7*  LYMPHSABS 2.0  --   --   --   --   MONOABS 0.8  --   --   --   --   EOSABS 0.0  --   --   --   --   BASOSABS 0.0  --   --   --   --     Chemistries   Recent Labs Lab 10/15/2016 1448 10/21/2016 1822 10/29/16 0430 10/29/16 0908 10/30/16 0055  NA 141  --  143 142 145  K 3.2*  --  3.9 3.9 4.0  CL 96*  --  103 104 108  CO2 32  --  31 30 29   GLUCOSE 99  --  89 92 168*  BUN 27*  --  22* 22* 19  CREATININE 0.68 0.64 0.85 0.72 0.55  CALCIUM 9.9  --  9.4 9.2 9.0  AST 41  --   --  40 42*  ALT 25  --   --  23 25  ALKPHOS 108  --   --  94 91  BILITOT 1.1  --   --  0.8  0.9   ------------------------------------------------------------------------------------------------------------------ estimated creatinine clearance is 77.8 mL/min (by C-G formula based on SCr of 0.55 mg/dL). ------------------------------------------------------------------------------------------------------------------ No results for input(s): HGBA1C in the last 72 hours. ------------------------------------------------------------------------------------------------------------------ No results for input(s): CHOL, HDL, LDLCALC, TRIG, CHOLHDL, LDLDIRECT in the last 72 hours. ------------------------------------------------------------------------------------------------------------------ No results for input(s): TSH, T4TOTAL, T3FREE, THYROIDAB in the last 72 hours.  Invalid input(s): FREET3 ------------------------------------------------------------------------------------------------------------------ No results for input(s): VITAMINB12, FOLATE, FERRITIN, TIBC, IRON, RETICCTPCT in the last 72 hours.  Coagulation profile No results for input(s): INR, PROTIME in the last 168 hours.  No results for input(s): DDIMER in the last 72 hours.  Cardiac Enzymes  Recent Labs Lab 10/18/2016 1822 10/21/2016 2249 10/29/16 0430  TROPONINI 0.03* 0.03* 0.03*   ------------------------------------------------------------------------------------------------------------------ Invalid input(s): POCBNP   CBG: No results for input(s): GLUCAP in the last 168 hours.     Studies: No results found.    Lab Results  Component Value Date   HGBA1C 6.4 (H) 12/03/2013   Lab Results  Component Value Date   LDLCALC 73 12/05/2013   CREATININE 0.55 10/30/2016       Scheduled Meds: . atorvastatin  40 mg Oral QPM  . budesonide (PULMICORT) nebulizer solution  0.5 mg Nebulization BID  . enoxaparin (LOVENOX) injection  100 mg Subcutaneous BID  . feeding supplement (ENSURE ENLIVE)  237 mL Oral  TID BM  . furosemide  40 mg Intravenous Q12H  . guaiFENesin  600 mg Oral BID  . ipratropium-albuterol  3 mL Nebulization QID  . methylPREDNISolone (SOLU-MEDROL) injection  60 mg Intravenous Q12H  . multivitamin with minerals  1 tablet Oral Daily  . rOPINIRole  1 mg Oral QHS   Continuous Infusions:    LOS: 3 days    Time spent: >30 MINS    [facility]  [facility] Pager [phone]. If 7PM-7AM, please contact night-coverage at [email or web address], password [facility] 11/01/2016, 11:26 AM  LOS: 3 days

## 2016-11-02 DIAGNOSIS — E43 Unspecified severe protein-calorie malnutrition: Secondary | ICD-10-CM

## 2016-11-02 DIAGNOSIS — A419 Sepsis, unspecified organism: Principal | ICD-10-CM

## 2016-11-02 LAB — BASIC METABOLIC PANEL
Anion gap: 13 (ref 5–15)
BUN: 13 mg/dL (ref 6–20)
CHLORIDE: 92 mmol/L — AB (ref 101–111)
CO2: 37 mmol/L — ABNORMAL HIGH (ref 22–32)
Calcium: 8.5 mg/dL — ABNORMAL LOW (ref 8.9–10.3)
Creatinine, Ser: 0.45 mg/dL (ref 0.44–1.00)
GFR calc Af Amer: >60 mL/min (ref >60)
GLUCOSE: 172 mg/dL — AB (ref 65–99)
POTASSIUM: 2.6 mmol/L — AB (ref 3.5–5.1)
Sodium: 142 mmol/L (ref 135–145)

## 2016-11-02 LAB — MAGNESIUM: Magnesium: 1.7 mg/dL (ref 1.7–2.4)

## 2016-11-02 LAB — CBC
HCT: 32.2 % — ABNORMAL LOW (ref 36.0–46.0)
Hemoglobin: 9.8 g/dL — ABNORMAL LOW (ref 12.0–15.0)
MCH: 25.8 pg — AB (ref 26.0–34.0)
MCHC: 30.4 g/dL (ref 30.0–36.0)
MCV: 84.7 fL (ref 78.0–100.0)
PLATELETS: 320 10*3/uL (ref 150–400)
RBC: 3.8 MIL/uL — AB (ref 3.87–5.11)
RDW: 17 % — ABNORMAL HIGH (ref 11.5–15.5)
WBC: 11.6 10*3/uL — ABNORMAL HIGH (ref 4.0–10.5)

## 2016-11-02 MED ORDER — IPRATROPIUM BROMIDE 0.02 % IN SOLN
0.5000 mg | Freq: Three times a day (TID) | RESPIRATORY_TRACT | Status: DC
  Administered 2016-11-02 – 2016-11-05 (×10): 0.5 mg via RESPIRATORY_TRACT
  Filled 2016-11-02 (×10): qty 2.5

## 2016-11-02 MED ORDER — SODIUM CHLORIDE 0.9 % IV SOLN
30.0000 meq | Freq: Once | INTRAVENOUS | Status: AC
  Administered 2016-11-02: 30 meq via INTRAVENOUS
  Filled 2016-11-02: qty 15

## 2016-11-02 MED ORDER — LEVALBUTEROL HCL 1.25 MG/0.5ML IN NEBU
1.2500 mg | INHALATION_SOLUTION | Freq: Three times a day (TID) | RESPIRATORY_TRACT | Status: DC
  Administered 2016-11-02 – 2016-11-05 (×10): 1.25 mg via RESPIRATORY_TRACT
  Filled 2016-11-02 (×11): qty 0.5

## 2016-11-02 MED ORDER — METHYLPREDNISOLONE SODIUM SUCC 40 MG IJ SOLR
40.0000 mg | Freq: Two times a day (BID) | INTRAMUSCULAR | Status: DC
  Administered 2016-11-02 – 2016-11-04 (×4): 40 mg via INTRAVENOUS
  Filled 2016-11-02 (×4): qty 1

## 2016-11-02 MED ORDER — POTASSIUM CHLORIDE CRYS ER 20 MEQ PO TBCR
40.0000 meq | EXTENDED_RELEASE_TABLET | Freq: Two times a day (BID) | ORAL | Status: DC
  Administered 2016-11-02 – 2016-11-03 (×3): 40 meq via ORAL
  Filled 2016-11-02 (×4): qty 2

## 2016-11-02 NOTE — Progress Notes (Signed)
CRITICAL VALUE ALERT  Critical value received:  K+ 2.6  Date of notification:  11/02/16  Time of notification:  0402  Critical value read back:yes  Nurse who received alert:  Lolita Patella, RN  MD notified (1st page): Dr Ara Kussmaul  Time of first page:  0403  Responding MD: Dr Ara Kussmaul  Time MD responded:  [phone]

## 2016-11-02 NOTE — Progress Notes (Signed)
PROGRESS NOTE  [name] YCX:[id number] DOB: [birth year] DOA: 10/12/2016 PCP: Merrilee Seashore, MD  HPI/Recap of past 24 hours: 70 year female past medical history metastatic non-small cell lung cancer with recurrent malignant right pleural effusion plus COPD and chronic respiratory failure on 3 L nasal cannula plus rheumatoid arthritis admitted on 12/25 after a fall in the bathroom and on admission, noted to have a white count of 14.6 felt to be septic secondary to pneumonia.  Over the next few days, patient responded to IV antibiotics, increased oxygen and steroids. She is also found to have a subsegmental PE. In the last few days, patient's breathing has somewhat improved, able to be off of BiPAP and now on 5 L nasal cannula. Palliative care consulted-after meeting with patient, she will wanted full measures and aggressive therapy. Today, patient with no complaints. She feels like breathing is improving. Still complains of productive cough. Pain moderately well-controlled  Assessment/Plan: Principal Problem:   Weakness Active Problems:   COPD GOLD II: Stable, tapering down steroids, continue nebulizers and supplemental oxygen   Essential hypertension: Continue home medications   Hypokalemia: Replacing  Back pain: Secondary to metastases. On when necessary morphine and a lot of for breakthrough pain. MS Contin and oxycodone have been held secondary to somnolence    Palliative care by specialist   Malignant neoplasm of lung ([place]) from non-small cell carcinoma of the lung: Oncology following, hopefully we'll be able to start chemotherapy after discharge   Acute respiratory failure ([place]) with hypoxia secondary to pneumonia, PE and malignant spread of lung cancer    Protein-calorie malnutrition, severe: Patient needs criteria given acute illness. Nutrition to see.  Sepsis secondary to community-acquired pneumonia: Patient met criteria for sepsis on admission given tachycardia, tachypnea,  fever and pneumonia source. Initially on broad-spectrum antibiotics and as blood cultures were negative, vancomycin discontinued. Sepsis now felt to be resolved. Continuing antibiotics.  Acute pulmonary embolus: Nonocclusive. Currently on heparin drip. We'll change over to Lovenox and then decide on Lovenox versus NOAC before discharge.  Code Status: Full code   Family Communication: Patient coming to call family, she states that she would talk to them.   Disposition Plan: Improving, so we'll transfer out of stepdown unit. Anticipate being in hospital please for several more days as breathing improves    Consultants:  Oncology  Palliative care   Procedures:  None   Antimicrobials:  IV vancomycin 12/26-present  IV cefepime and 12/26-present  DVT prophylaxis: Heparin drip, changing to Lovenox   Objective: Vitals:   11/02/16 0600 11/02/16 0740 11/02/16 0800 11/02/16 0930  BP:  (!) 178/86    Pulse: (!) 109 (!) 112    Resp: (!) 24 (!) 28    Temp:   97.1 F (36.2 C)   TempSrc:   Oral   SpO2: 95% 93%  95%  Weight:      Height:        Intake/Output Summary (Last 24 hours) at 11/02/16 1333 Last data filed at 11/02/16 0900  Gross per 24 hour  Intake              385 ml  Output             4100 ml  Net            -3715 ml   Filed Weights   10/05/2016 1855 10/29/16 1117 11/01/16 0000  Weight: 100.7 kg (222 lb 0.1 oz) 99.6 kg (219 lb 9.3 oz) 104.7 kg (230 lb 13.2 oz)  Exam:   General:  Alert and oriented 3, no acute distress   Cardiovascular: Regular rhythm, borderline tachycardia   Respiratory: Decreased breath sounds throughout   Abdomen: Soft, nontender, nondistended, positive bowel sounds   Musculoskeletal: No clubbing or cyanosis or edema   Psychiatry: Patient is appropriate, no evidence of psychoses    Data Reviewed: CBC:  Recent Labs Lab 10/25/2016 1448 11/01/2016 1822 10/29/16 0908 10/30/16 0055 11/01/16 0314 11/02/16 0306  WBC 14.6* 14.0*  11.2* 7.8 14.5* 11.6*  NEUTROABS 11.7*  --   --   --   --   --   HGB 10.6* 9.5* 9.5* 9.5* 9.4* 9.8*  HCT 34.4* 31.4* 32.0* 32.0* 30.4* 32.2*  MCV 86.0 87.0 89.6 88.4 85.4 84.7  PLT 302 295 298 279 363 322   Basic Metabolic Panel:  Recent Labs Lab 10/27/2016 1448 10/21/2016 1822 10/29/16 0430 10/29/16 0908 10/30/16 0055 11/02/16 0306  NA 141  --  143 142 145 142  K 3.2*  --  3.9 3.9 4.0 2.6*  CL 96*  --  103 104 108 92*  CO2 32  --  _0 37*  GLUCOSE 99  --  89 92 168* 172*  BUN 27*  --  22* 22* 19 13  CREATININE 0.68 0.64 0.85 0.72 0.55 0.45  CALCIUM 9.9  --  9.4 9.2 9.0 8.5*  MG  --   --   --   --   --  1.7   GFR: Estimated Creatinine Clearance: 77.8 mL/min (by C-G formula based on SCr of 0.45 mg/dL). Liver Function Tests:  Recent Labs Lab 10/26/2016 1448 10/29/16 0908 10/30/16 0055  AST 41 40 42*  ALT _1 ALKPHOS 108 94 91  BILITOT 1.1 0.8 0.9  PROT 6.7 6.1* 5.8*  ALBUMIN 2.5* 2.2* 2.3*   No results for input(s): LIPASE, AMYLASE in the last 168 hours. No results for input(s): AMMONIA in the last 168 hours. Coagulation Profile: No results for input(s): INR, PROTIME in the last 168 hours. Cardiac Enzymes:  Recent Labs Lab 10/12/2016 1448 11/03/2016 1822 10/18/2016 2249 10/29/16 0430  TROPONINI 0.03* 0.03* 0.03* 0.03*   BNP (last 3 results) No results for input(s): PROBNP in the last 8760 hours. HbA1C: No results for input(s): HGBA1C in the last 72 hours. CBG: No results for input(s): GLUCAP in the last 168 hours. Lipid Profile: No results for input(s): CHOL, HDL, LDLCALC, TRIG, CHOLHDL, LDLDIRECT in the last 72 hours. Thyroid Function Tests: No results for input(s): TSH, T4TOTAL, FREET4, T3FREE, THYROIDAB in the last 72 hours. Anemia Panel: No results for input(s): VITAMINB12, FOLATE, FERRITIN, TIBC, IRON, RETICCTPCT in the last 72 hours. Urine analysis:    Component Value Date/Time   COLORURINE AMBER (A) 10/29/2016 0805   APPEARANCEUR CLOUDY (A)  10/29/2016 0805   LABSPEC 1.021 10/29/2016 0805   PHURINE 5.0 10/29/2016 0805   GLUCOSEU NEGATIVE 10/29/2016 0805   HGBUR NEGATIVE 10/29/2016 0805   BILIRUBINUR NEGATIVE 10/29/2016 0805   KETONESUR NEGATIVE 10/29/2016 0805   PROTEINUR 30 (A) 10/29/2016 0805   UROBILINOGEN 0.2 04/30/2011 1340   NITRITE NEGATIVE 10/29/2016 0805   LEUKOCYTESUR SMALL (A) 10/29/2016 0805   Sepsis Labs: _2 (procalcitonin:4,lacticidven:4)  ) Recent Results (from the past 240 hour(s))  Urine culture     Status: Abnormal   Collection Time: 10/11/2016  4:29 PM  Result Value Ref Range Status   Specimen Description URINE, RANDOM  Final   Special Requests NONE  Final   Culture (A)  Final    <  10,000 COLONIES/mL INSIGNIFICANT GROWTH Performed at [facility]    Report Status 10/29/2016 FINAL  Final  Culture, blood (routine x 2)     Status: None (Preliminary result)   Collection Time: 10/29/16  9:08 AM  Result Value Ref Range Status   Specimen Description BLOOD RIGHT ARM  Final   Special Requests BOTTLES DRAWN AEROBIC AND ANAEROBIC 5CC  Final   Culture   Final    NO GROWTH 4 DAYS Performed at [facility]    Report Status PENDING  Incomplete  Culture, blood (routine x 2)     Status: None (Preliminary result)   Collection Time: 10/29/16  9:11 AM  Result Value Ref Range Status   Specimen Description BLOOD RIGHT HAND  Final   Special Requests BOTTLES DRAWN AEROBIC AND ANAEROBIC 5CC  Final   Culture   Final    NO GROWTH 4 DAYS Performed at [facility]    Report Status PENDING  Incomplete      Studies: No results found.  Scheduled Meds: . atorvastatin  40 mg Oral QPM  . budesonide (PULMICORT) nebulizer solution  0.5 mg Nebulization BID  . enoxaparin (LOVENOX) injection  100 mg Subcutaneous BID  . feeding supplement (ENSURE ENLIVE)  237 mL Oral TID BM  . guaiFENesin  600 mg Oral BID  . ipratropium  0.5 mg Nebulization TID  . levalbuterol  1.25 mg Nebulization TID  .  methylPREDNISolone (SOLU-MEDROL) injection  60 mg Intravenous Q12H  . multivitamin with minerals  1 tablet Oral Daily  . potassium chloride  40 mEq Oral BID  . rOPINIRole  1 mg Oral QHS    Continuous Infusions:   LOS: 4 days     Annita Brod, MD [facility] Pager [phone]  If 7PM-7AM, please contact night-coverage [email or web address] Password [email or web address] 11/02/2016, 1:33 PM

## 2016-11-03 ENCOUNTER — Inpatient Hospital Stay (HOSPITAL_COMMUNITY): Payer: Medicare Other

## 2016-11-03 DIAGNOSIS — I5031 Acute diastolic (congestive) heart failure: Secondary | ICD-10-CM

## 2016-11-03 LAB — CULTURE, BLOOD (ROUTINE X 2)
Culture: NO GROWTH
Culture: NO GROWTH

## 2016-11-03 LAB — BLOOD GAS, ARTERIAL
ACID-BASE EXCESS: 10.6 mmol/L — AB (ref 0.0–2.0)
BICARBONATE: 34.6 mmol/L — AB (ref 20.0–28.0)
DRAWN BY: 441261
O2 Content: 6 L/min
O2 Saturation: 91.8 %
PH ART: 7.509 — AB (ref 7.350–7.450)
PO2 ART: 62.8 mmHg — AB (ref 83.0–108.0)
Patient temperature: 98.6
pCO2 arterial: 43.8 mmHg (ref 32.0–48.0)

## 2016-11-03 LAB — BASIC METABOLIC PANEL
Anion gap: 10 (ref 5–15)
BUN: 19 mg/dL (ref 6–20)
CHLORIDE: 98 mmol/L — AB (ref 101–111)
CO2: 34 mmol/L — AB (ref 22–32)
CREATININE: 0.46 mg/dL (ref 0.44–1.00)
Calcium: 9 mg/dL (ref 8.9–10.3)
GFR calc Af Amer: >60 mL/min (ref >60)
GFR calc non Af Amer: >60 mL/min (ref >60)
GLUCOSE: 134 mg/dL — AB (ref 65–99)
Potassium: 2.9 mmol/L — ABNORMAL LOW (ref 3.5–5.1)
SODIUM: 142 mmol/L (ref 135–145)

## 2016-11-03 LAB — CBC
HCT: 36.1 % (ref 36.0–46.0)
Hemoglobin: 11 g/dL — ABNORMAL LOW (ref 12.0–15.0)
MCH: 26.1 pg (ref 26.0–34.0)
MCHC: 30.5 g/dL (ref 30.0–36.0)
MCV: 85.7 fL (ref 78.0–100.0)
PLATELETS: 261 10*3/uL (ref 150–400)
RBC: 4.21 MIL/uL (ref 3.87–5.11)
RDW: 17.5 % — AB (ref 11.5–15.5)
WBC: 17.3 10*3/uL — AB (ref 4.0–10.5)

## 2016-11-03 LAB — BRAIN NATRIURETIC PEPTIDE: B NATRIURETIC PEPTIDE 5: 48.6 pg/mL (ref 0.0–100.0)

## 2016-11-03 MED ORDER — FUROSEMIDE 10 MG/ML IJ SOLN
20.0000 mg | Freq: Once | INTRAMUSCULAR | Status: AC
  Administered 2016-11-03: 20 mg via INTRAVENOUS
  Filled 2016-11-03: qty 2

## 2016-11-03 MED ORDER — POTASSIUM CHLORIDE 20 MEQ/15ML (10%) PO SOLN: 40.0000 meq | Freq: Two times a day (BID) | ORAL | Status: DC

## 2016-11-03 MED ORDER — ACETAMINOPHEN 650 MG RE SUPP
650.0000 mg | RECTAL | Status: DC | PRN
  Administered 2016-11-03: 650 mg via RECTAL
  Filled 2016-11-03: qty 1

## 2016-11-03 MED ORDER — SODIUM CHLORIDE 0.9 % IV SOLN
30.0000 meq | Freq: Once | INTRAVENOUS | Status: AC
  Administered 2016-11-03: 30 meq via INTRAVENOUS
  Filled 2016-11-03: qty 15

## 2016-11-03 MED ORDER — FUROSEMIDE 20 MG PO TABS
20.0000 mg | ORAL_TABLET | ORAL | Status: AC
  Administered 2016-11-03: 20 mg via ORAL
  Filled 2016-11-03: qty 1

## 2016-11-03 MED ORDER — FUROSEMIDE 10 MG/ML IJ SOLN
40.0000 mg | Freq: Two times a day (BID) | INTRAMUSCULAR | Status: DC
  Administered 2016-11-03 – 2016-11-05 (×4): 40 mg via INTRAVENOUS
  Filled 2016-11-03 (×4): qty 4

## 2016-11-03 NOTE — Progress Notes (Signed)
Initial Nutrition Assessment  DOCUMENTATION CODES:   Not applicable  INTERVENTION:   Ensure Enlive po TID, each supplement provides 350 kcal and 20 grams of protein  MVI daily   NUTRITION DIAGNOSIS:   Inadequate oral intake related to acute illness, cancer and cancer related treatments as evidenced by per patient/family report.  GOAL:   Patient will meet greater than or equal to 90% of their needs  MONITOR:   PO intake, Supplement acceptance  REASON FOR ASSESSMENT:   Consult Assessment of nutrition requirement/status  ASSESSMENT:   70 year female past medical history metastatic non-small cell lung cancer with recurrent malignant right pleural effusion plus COPD and chronic respiratory failure on 3 L nasal cannula plus rheumatoid arthritis admitted on 12/25 after a fall in the bathroom and on admission, noted to have a white count of 14.6 felt to be septic secondary to pneumonia.   Met with pt in room today. Pt reports poor appetite for several weeks pta. Pt currently eating 75% meals. Pt likes EE mixed with ice cream. Per chart, pt has lost 11lbs(5%) in one month. This is significant.   Medications reviewed and include: lovenox, lasix, solu-medrol, MVI, KCl   Labs reviewed: K 2.9(L), Cl 98(L), CO2 34(H), ALB 2.3(L) 12/27  wbc- 17.3(H) CBGs- 172, 134 x 24hrs  Nutrition-Focused physical exam completed. Findings are no fat depletion, no muscle depletion, and mild edema.   Diet Order:  Diet Heart Room service appropriate? Yes; Fluid consistency: Thin  Skin:  Reviewed, no issues  Last BM:  12/31  Height:   Ht Readings from Last 1 Encounters:  10/29/16 5' 6"  (1.676 m)    Weight:   Wt Readings from Last 1 Encounters:  11/01/16 230 lb 13.2 oz (104.7 kg)    Ideal Body Weight:  59 kg  BMI:  Body mass index is 37.26 kg/m.  Estimated Nutritional Needs:   Kcal:  1800-2100kcal/day   Protein:  115-135g/day   Fluid:  >1.8L/day   EDUCATION NEEDS:   No  education needs identified at this time  Koleen Distance, RD, LDN Pager #[phone]

## 2016-11-03 NOTE — Progress Notes (Signed)
Due to pt's increased WOB (increased HR, RR and decreased SpO2,) RN has been notified and pt placed on BiPAP and given treatment.

## 2016-11-03 NOTE — Progress Notes (Signed)
PROGRESS NOTE  [name] QIH:[id number] DOB: Jul 20, 1946 DOA: 10/21/2016 PCP: Merrilee Seashore, MD  HPI/Recap of past 24 hours: 70 year female past medical history metastatic non-small cell lung cancer with recurrent malignant right pleural effusion plus COPD and chronic respiratory failure on 3 L nasal cannula plus rheumatoid arthritis admitted on 12/25 after a fall in the bathroom and on admission, noted to have a white count of 14.6 felt to be septic secondary to pneumonia.  Over the next few days, patient responded to IV antibiotics, increased oxygen and steroids. She is also found to have a subsegmental PE. In the last few days, patient's breathing has somewhat improved, able to be off of BiPAP and now on 5 L nasal cannula. Palliative care consulted-after meeting with patient, she will wanted full measures and aggressive therapy. Transfer to telemetry bed on 12/30.  This morning, patient more tachypneic. He is a breathing is a little bit rough. ABG done on 6 L noted decreased PO2 although PCO2 unrevealing. Chest x-ray actually noted improvement. Review of input and output, patient is 6 L positive. Given 1 dose of IV Lasix and she put out over 1.5 L.  Assessment/Plan: Principal Problem:   Weakness Active Problems:   COPD GOLD II: Stable, tapering down steroids, continue nebulizers and supplemental oxygen   Essential hypertension: Continue home medications   Hypokalemia: Replacing  Back pain: Secondary to metastases. On when necessary morphine and a lot of for breakthrough pain. MS Contin and oxycodone have been held secondary to somnolence    Palliative care by specialist   Malignant neoplasm of lung ([place]) from non-small cell carcinoma of the lung: Oncology following, hopefully we'll be able to start chemotherapy after discharge   Acute respiratory failure ([place]) with hypoxia secondary to pneumonia, PE and malignant spread of lung cancer and now acute diastolic heart failure: Patient  likely now with cor pulmonale given all of her pulmonary issues. Have started Lasix    Protein-calorie malnutrition, severe: Patient needs criteria given acute illness. Nutrition to see.  Sepsis secondary to community-acquired pneumonia: Patient met criteria for sepsis on admission given tachycardia, tachypnea, fever and pneumonia source. Initially on broad-spectrum antibiotics and as blood cultures were negative, vancomycin discontinued. Sepsis now felt to be resolved. Continuing antibiotics.  Acute pulmonary embolus: Nonocclusive. Currently on heparin drip. We'll change over to Lovenox and then decide on Lovenox versus NOAC before discharge.  Code Status: Full code   Family Communication: Patient coming to call family, she states that she would talk to them.   Disposition Plan: Improving, so we'll transfer out of stepdown unit. Anticipate being in hospital please for several more days as breathing improves    Consultants:  Oncology  Palliative care   Procedures:  None   Antimicrobials:  IV vancomycin 12/26-present  IV cefepime and 12/26-present  DVT prophylaxis: Heparin drip, changing to Lovenox   Objective: Vitals:   11/03/16 0545 11/03/16 0601 11/03/16 0824 11/03/16 1000  BP: (!) 152/75     Pulse: (!) 122 (!) 111 (!) 130 (!) 115  Resp: 20  (!) 24 (!) 22  Temp: 98.1 F (36.7 C)     TempSrc: Oral     SpO2: 93%  91%   Weight:      Height:        Intake/Output Summary (Last 24 hours) at 11/03/16 1354 Last data filed at 11/03/16 0837  Gross per 24 hour  Intake              540  ml  Output             1200 ml  Net             -660 ml   Filed Weights   10/08/2016 1855 10/29/16 1117 11/01/16 0000  Weight: 100.7 kg (222 lb 0.1 oz) 99.6 kg (219 lb 9.3 oz) 104.7 kg (230 lb 13.2 oz)    Exam:   General:  Alert and oriented 3, Mild distress from labored breathing  Cardiovascular: Regular rhythm, borderline tachycardia   Respiratory: Decreased breath sounds  throughout , bilateral expiratory wheeze, type  Abdomen: Soft, nontender, nondistended, positive bowel sounds   Musculoskeletal: No clubbing or cyanosis or edema   Psychiatry: Patient is appropriate, no evidence of psychoses    Data Reviewed: CBC:  Recent Labs Lab 10/24/2016 1448  10/29/16 0908 10/30/16 0055 11/01/16 0314 11/02/16 0306 11/03/16 0517  WBC 14.6*  < > 11.2* 7.8 14.5* 11.6* 17.3*  NEUTROABS 11.7*  --   --   --   --   --   --   HGB 10.6*  < > 9.5* 9.5* 9.4* 9.8* 11.0*  HCT 34.4*  < > 32.0* 32.0* 30.4* 32.2* 36.1  MCV 86.0  < > 89.6 88.4 85.4 84.7 85.7  PLT 302  < > 298 279 363 320 261  < > = values in this interval not displayed. Basic Metabolic Panel:  Recent Labs Lab 10/29/16 0430 10/29/16 0908 10/30/16 0055 11/02/16 0306 11/03/16 0516  NA 143 142 145 142 142  K 3.9 3.9 4.0 2.6* 2.9*  CL 103 104 108 92* 98*  CO2 _0 37* 34*  GLUCOSE 89 92 168* 172* 134*  BUN 22* 22* _1 CREATININE 0.85 0.72 0.55 0.45 0.46  CALCIUM 9.4 9.2 9.0 8.5* 9.0  MG  --   --   --  1.7  --    GFR: Estimated Creatinine Clearance: 77.8 mL/min (by C-G formula based on SCr of 0.46 mg/dL). Liver Function Tests:  Recent Labs Lab 10/07/2016 1448 10/29/16 0908 10/30/16 0055  AST 41 40 42*  ALT _2 ALKPHOS 108 94 91  BILITOT 1.1 0.8 0.9  PROT 6.7 6.1* 5.8*  ALBUMIN 2.5* 2.2* 2.3*   No results for input(s): LIPASE, AMYLASE in the last 168 hours. No results for input(s): AMMONIA in the last 168 hours. Coagulation Profile: No results for input(s): INR, PROTIME in the last 168 hours. Cardiac Enzymes:  Recent Labs Lab 10/26/2016 1448 10/16/2016 1822 10/16/2016 2249 10/29/16 0430  TROPONINI 0.03* 0.03* 0.03* 0.03*   BNP (last 3 results) No results for input(s): PROBNP in the last 8760 hours. HbA1C: No results for input(s): HGBA1C in the last 72 hours. CBG: No results for input(s): GLUCAP in the last 168 hours. Lipid Profile: No results for input(s): CHOL,  HDL, LDLCALC, TRIG, CHOLHDL, LDLDIRECT in the last 72 hours. Thyroid Function Tests: No results for input(s): TSH, T4TOTAL, FREET4, T3FREE, THYROIDAB in the last 72 hours. Anemia Panel: No results for input(s): VITAMINB12, FOLATE, FERRITIN, TIBC, IRON, RETICCTPCT in the last 72 hours. Urine analysis:    Component Value Date/Time   COLORURINE AMBER (A) 10/29/2016 0805   APPEARANCEUR CLOUDY (A) 10/29/2016 0805   LABSPEC 1.021 10/29/2016 0805   PHURINE 5.0 10/29/2016 0805   GLUCOSEU NEGATIVE 10/29/2016 0805   HGBUR NEGATIVE 10/29/2016 0805   BILIRUBINUR NEGATIVE 10/29/2016 0805   KETONESUR NEGATIVE 10/29/2016 0805   PROTEINUR 30 (A) 10/29/2016 0805   UROBILINOGEN 0.2  04/30/2011 1340   NITRITE NEGATIVE 10/29/2016 0805   LEUKOCYTESUR SMALL (A) 10/29/2016 0805   Sepsis Labs: _0 (procalcitonin:4,lacticidven:4)  ) Recent Results (from the past 240 hour(s))  Urine culture     Status: Abnormal   Collection Time: 10/29/2016  4:29 PM  Result Value Ref Range Status   Specimen Description URINE, RANDOM  Final   Special Requests NONE  Final   Culture (A)  Final    <10,000 COLONIES/mL INSIGNIFICANT GROWTH Performed at [facility]    Report Status 10/29/2016 FINAL  Final  Culture, blood (routine x 2)     Status: None (Preliminary result)   Collection Time: 10/29/16  9:08 AM  Result Value Ref Range Status   Specimen Description BLOOD RIGHT ARM  Final   Special Requests BOTTLES DRAWN AEROBIC AND ANAEROBIC 5CC  Final   Culture   Final    NO GROWTH 4 DAYS Performed at [facility]    Report Status PENDING  Incomplete  Culture, blood (routine x 2)     Status: None (Preliminary result)   Collection Time: 10/29/16  9:11 AM  Result Value Ref Range Status   Specimen Description BLOOD RIGHT HAND  Final   Special Requests BOTTLES DRAWN AEROBIC AND ANAEROBIC 5CC  Final   Culture   Final    NO GROWTH 4 DAYS Performed at [facility]    Report Status PENDING   Incomplete      Studies: Dg Chest 1 View  Result Date: 11/03/2016 CLINICAL DATA:  Tachypnea.  History of COPD and hypertension. EXAM: CHEST 1 VIEW COMPARISON:  11/01/2016 FINDINGS: Since prior exam, there has been improvement. Airspace opacity in the right mid and lower lung has improved. There is residual, persistent opacity that obscures the right hemidiaphragm consistent with a combination of infiltrate and pleural fluid. Left lung remains clear. IMPRESSION: 1. Improved right lung aeration as detailed above when compared the prior study. No new abnormalities. Electronically Signed   By: Lajean Manes M.D.   On: 11/03/2016 09:02    Scheduled Meds: . atorvastatin  40 mg Oral QPM  . budesonide (PULMICORT) nebulizer solution  0.5 mg Nebulization BID  . enoxaparin (LOVENOX) injection  100 mg Subcutaneous BID  . feeding supplement (ENSURE ENLIVE)  237 mL Oral TID BM  . furosemide  40 mg Intravenous Q12H  . guaiFENesin  600 mg Oral BID  . ipratropium  0.5 mg Nebulization TID  . levalbuterol  1.25 mg Nebulization TID  . methylPREDNISolone (SOLU-MEDROL) injection  40 mg Intravenous Q12H  . multivitamin with minerals  1 tablet Oral Daily  . potassium chloride  40 mEq Oral BID  . rOPINIRole  1 mg Oral QHS    Continuous Infusions:   LOS: 5 days     Annita Brod, MD [facility] Pager [phone]  If 7PM-7AM, please contact night-coverage [email or web address] Password [email or web address] 11/03/2016, 1:54 PM

## 2016-11-03 NOTE — Progress Notes (Signed)
2200 thru 2300 PT HR continues to be elevated as it has been all day now mostly 145.  Comfort issues address.  PT had a large incont BM and just cleaned up, repositioned, pain med given.  bp is 135/78, rr 24, oxygen 6l/m and sating 92%, temp is now 99.7 AXil,  Pt slightly diaphoretic and sob.  LS remain decreased but not wet.  40 of IV lasix just given.  PT continues to refuse po meds and day rn states MD is aware.  RT expresses concern and has placed pt back on Bipap.    I have notified K. Schoor on call and have given Tylenol suppository.  K. Schoor has acknowledge all of this and has been given RT number and is calling her.   2300 sat on bipap is  94%,  HR is now 140, RR 30, bp now 151/79

## 2016-11-03 NOTE — Progress Notes (Signed)
HR is 140, sats are 94%,  RR is now 30, pt continues not to take PO and her K+ was 2.7 this AM.  Has 40 KCL due now and I have notified K. Schorr to see if she wants to change to IV  Awaiting respone os

## 2016-11-04 DIAGNOSIS — R000 Tachycardia, unspecified: Secondary | ICD-10-CM

## 2016-11-04 LAB — CBC
HEMATOCRIT: 35.5 % — AB (ref 36.0–46.0)
Hemoglobin: 10.5 g/dL — ABNORMAL LOW (ref 12.0–15.0)
MCH: 25.4 pg — ABNORMAL LOW (ref 26.0–34.0)
MCHC: 29.6 g/dL — ABNORMAL LOW (ref 30.0–36.0)
MCV: 86 fL (ref 78.0–100.0)
PLATELETS: 201 10*3/uL (ref 150–400)
RBC: 4.13 MIL/uL (ref 3.87–5.11)
RDW: 17.6 % — AB (ref 11.5–15.5)
WBC: 15.9 10*3/uL — ABNORMAL HIGH (ref 4.0–10.5)

## 2016-11-04 LAB — BLOOD GAS, ARTERIAL
Acid-Base Excess: 11.3 mmol/L — ABNORMAL HIGH (ref 0.0–2.0)
BICARBONATE: 36 mmol/L — AB (ref 20.0–28.0)
Drawn by: 295031
O2 Content: 6 L/min
O2 Saturation: 96.1 %
PATIENT TEMPERATURE: 98.6
PH ART: 7.493 — AB (ref 7.350–7.450)
PO2 ART: 86 mmHg (ref 83.0–108.0)
pCO2 arterial: 47.4 mmHg (ref 32.0–48.0)

## 2016-11-04 LAB — BASIC METABOLIC PANEL
ANION GAP: 12 (ref 5–15)
BUN: 27 mg/dL — AB (ref 6–20)
CALCIUM: 9.1 mg/dL (ref 8.9–10.3)
CO2: 34 mmol/L — AB (ref 22–32)
Chloride: 99 mmol/L — ABNORMAL LOW (ref 101–111)
Creatinine, Ser: 0.43 mg/dL — ABNORMAL LOW (ref 0.44–1.00)
GFR calc Af Amer: >60 mL/min (ref >60)
Glucose, Bld: 158 mg/dL — ABNORMAL HIGH (ref 65–99)
Potassium: 3.1 mmol/L — ABNORMAL LOW (ref 3.5–5.1)
Sodium: 145 mmol/L (ref 135–145)

## 2016-11-04 LAB — MAGNESIUM: Magnesium: 1.9 mg/dL (ref 1.7–2.4)

## 2016-11-04 MED ORDER — METOPROLOL TARTRATE 5 MG/5ML IV SOLN
5.0000 mg | INTRAVENOUS | Status: AC
  Administered 2016-11-04: 5 mg via INTRAVENOUS
  Filled 2016-11-04: qty 5

## 2016-11-04 MED ORDER — LORAZEPAM 2 MG/ML IJ SOLN
0.5000 mg | Freq: Once | INTRAMUSCULAR | Status: AC
  Administered 2016-11-04: 0.5 mg via INTRAVENOUS
  Filled 2016-11-04: qty 1

## 2016-11-04 MED ORDER — POTASSIUM CHLORIDE 20 MEQ/15ML (10%) PO SOLN
40.0000 meq | Freq: Two times a day (BID) | ORAL | Status: DC
  Administered 2016-11-04: 40 meq via ORAL
  Filled 2016-11-04 (×2): qty 30

## 2016-11-04 MED ORDER — METOPROLOL TARTRATE 5 MG/5ML IV SOLN
5.0000 mg | Freq: Four times a day (QID) | INTRAVENOUS | Status: DC
  Administered 2016-11-04 – 2016-11-05 (×5): 5 mg via INTRAVENOUS
  Filled 2016-11-04 (×5): qty 5

## 2016-11-04 MED ORDER — METOPROLOL TARTRATE 5 MG/5ML IV SOLN
5.0000 mg | Freq: Once | INTRAVENOUS | Status: AC
  Administered 2016-11-04: 5 mg via INTRAVENOUS
  Filled 2016-11-04: qty 5

## 2016-11-04 MED ORDER — FUROSEMIDE 10 MG/ML IJ SOLN
20.0000 mg | Freq: Once | INTRAMUSCULAR | Status: AC
  Administered 2016-11-04: 20 mg via INTRAVENOUS
  Filled 2016-11-04: qty 2

## 2016-11-04 MED ORDER — METHYLPREDNISOLONE SODIUM SUCC 40 MG IJ SOLR
30.0000 mg | Freq: Two times a day (BID) | INTRAMUSCULAR | Status: DC
  Administered 2016-11-04 – 2016-11-05 (×2): 30 mg via INTRAVENOUS
  Filled 2016-11-04 (×3): qty 1

## 2016-11-04 NOTE — Progress Notes (Signed)
PT Cancellation Note  Patient Details Name: [name] MRN: [id number] DOB: 08/20/1946   Cancelled Treatment:     per chart review will hold off PT today.     Rica Koyanagi  PTA [facility]  Acute  Rehab Pager      [phone]

## 2016-11-04 NOTE — Progress Notes (Signed)
Pt now resting quietly with eyes closed on 6l/m high flow meter, HR on monitor is 106, rr are 28 and restraint on.

## 2016-11-04 NOTE — Progress Notes (Signed)
Pt HR now 133, RR 24, sats remain 91-94% on bipap.  Pt continue to pull off bipap and equipment even with much education.  Mittens placed to protect lines and bipap.

## 2016-11-04 NOTE — Progress Notes (Addendum)
Talked with K Schorr unable to keep pt from pulling on oxygen and bipap.   No sitters available per CN and AC.  NT and RN  from floor unable to stay in pt room constantly Have changed bipap back to 6l/m high flow oxygen, given ativan, lasix and lopressor and placed pt into wrist restraints d/t pt constantly pulling off oxygen/bipap etc.  Have educated and pt unable to verbalize understanding.  Danie Chandler, significant other, called without answer.  Have left message to have her call.

## 2016-11-04 NOTE — Progress Notes (Addendum)
PROGRESS NOTE  [name] LAG:[id number] DOB: 1946/05/24 DOA: 11/02/2016 PCP: Merrilee Seashore, MD  HPI/Recap of past 24 hours: 71 year female past medical history metastatic non-small cell lung cancer with recurrent malignant right pleural effusion plus COPD and chronic respiratory failure on 3 L nasal cannula plus rheumatoid arthritis admitted on 12/25 after a fall in the bathroom and on admission, noted to have a white count of 14.6 felt to be septic secondary to pneumonia.  Over the next few days, patient responded to IV antibiotics, increased oxygen and steroids. She is also found to have a subsegmental PE. In the last few days, patient's breathing has somewhat improved, able to be off of BiPAP and now on 5 L nasal cannula. Palliative care consulted-after meeting with patient, she will wanted full measures and aggressive therapy. Transfer to telemetry bed on 12/30.  By 12/31, patient had more labored breathing and ABG done on 6 L noted decreased PO2 although PCO2 unremarkable with chest x-ray noting improvement. Patient noted to be 6 L positive so started on Lasix and responded well to this. She initially improved, but then overnight, had increased work of breathing and became tachycardic (EKG noted sinus tachycardia).  Patient required Ativan.  This morning, a little bit more confused and somnolent, likely from Ativan. Still tachycardic with mildly labored breathing. ABG actually looked good with good PO2 and PCO2 although obviously needing 6 L. Given dose of IV Lopressor which she responded to well.  Assessment/Plan: Principal Problem:   Weakness Active Problems:   COPD GOLD II: Stable, continue slow taper of steroids, continue nebulizers and supplemental oxygen   Essential hypertension: Continue home medications   Hypokalemia: Replacing  Back pain: Secondary to metastases. On when necessary morphine and a lot of for breakthrough pain. MS Contin and oxycodone initially held secondary  to somnolence. She seems to have more pain which may be causing some of her tachycardia. The patient is more awake, we'll discuss her pain control issues    Palliative care by specialist   Malignant neoplasm of lung ([place]) from non-small cell carcinoma of the lung: Oncology following, hopefully we'll be able to start chemotherapy after discharge   Acute respiratory failure ([place]) with hypoxia secondary to pneumonia, PE and malignant spread of lung cancer and now acute diastolic heart failure: Patient likely now with cor pulmonale given all of her pulmonary issues. Have started Lasix and patient is down 2 L in the last 24 hours    Protein-calorie malnutrition, severe: Patient meets criteria given acute illness. Nutrition seen, on ensure 3 times a day  Sepsis secondary to community-acquired pneumonia: Patient met criteria for sepsis on admission given tachycardia, tachypnea, fever and pneumonia source. Initially on broad-spectrum antibiotics and as blood cultures were negative, vancomycin discontinued. Sepsis now felt to be resolved. Continuing antibiotics.  Acute pulmonary embolus: Nonocclusive. Currently on heparin drip. We'll change over to Lovenox and then decide on Lovenox versus NOAC before discharge.  Code Status: Full code   Family Communication: Patient declined for me to call family, she states that she would talk to them.   Disposition Plan: We'll continue on floor for now. She has more episodes, transfer to stepdown.   Consultants:  Oncology  Palliative care   Procedures:  None   Antimicrobials:  IV vancomycin 12/26-present  IV cefepime and 12/26-present  DVT prophylaxis: Heparin drip, changing to Lovenox   Objective: Vitals:   11/04/16 0735 11/04/16 0947 11/04/16 0955 11/04/16 1037  BP:   (!) 162/82  Pulse:   (!) 146 (!) 128  Resp:      Temp:      TempSrc:      SpO2: 91%     Weight:  95 kg (209 lb 7 oz)    Height:        Intake/Output Summary (Last 24  hours) at 11/04/16 1053 Last data filed at 11/04/16 0358  Gross per 24 hour  Intake              480 ml  Output             2300 ml  Net            -1820 ml   Filed Weights   10/29/16 1117 11/01/16 0000 11/04/16 0947  Weight: 99.6 kg (219 lb 9.3 oz) 104.7 kg (230 lb 13.2 oz) 95 kg (209 lb 7 oz)    Exam:   General:  Groggy, disoriented, Mild distress from labored breathing  Cardiovascular: Regular rhythm, tachycardic  Respiratory: Decreased breath sounds throughout, no crackles or wheezes  Abdomen: Soft, nontender, nondistended, positive bowel sounds   Musculoskeletal: No clubbing or cyanosis or edema   Psychiatry: Patient is appropriate, no evidence of psychoses    Data Reviewed: CBC:  Recent Labs Lab 10/31/2016 1448  10/30/16 0055 11/01/16 0314 11/02/16 0306 11/03/16 0517 11/04/16 0515  WBC 14.6*  < > 7.8 14.5* 11.6* 17.3* 15.9*  NEUTROABS 11.7*  --   --   --   --   --   --   HGB 10.6*  < > 9.5* 9.4* 9.8* 11.0* 10.5*  HCT 34.4*  < > 32.0* 30.4* 32.2* 36.1 35.5*  MCV 86.0  < > 88.4 85.4 84.7 85.7 86.0  PLT 302  < > 279 363 320 261 201  < > = values in this interval not displayed. Basic Metabolic Panel:  Recent Labs Lab 10/29/16 0908 10/30/16 0055 11/02/16 0306 11/03/16 0516 11/04/16 0515  NA 142 145 142 142 145  K 3.9 4.0 2.6* 2.9* 3.1*  CL 104 108 92* 98* 99*  CO2 30 29 37* 34* 34*  GLUCOSE 92 168* 172* 134* 158*  BUN 22* 19 13 19  27*  CREATININE 0.72 0.55 0.45 0.46 0.43*  CALCIUM 9.2 9.0 8.5* 9.0 9.1  MG  --   --  1.7  --  1.9   GFR: Estimated Creatinine Clearance: 73.9 mL/min (by C-G formula based on SCr of 0.43 mg/dL (L)). Liver Function Tests:  Recent Labs Lab 10/21/2016 1448 10/29/16 0908 10/30/16 0055  AST 41 40 42*  ALT 25 23 25   ALKPHOS 108 94 91  BILITOT 1.1 0.8 0.9  PROT 6.7 6.1* 5.8*  ALBUMIN 2.5* 2.2* 2.3*   No results for input(s): LIPASE, AMYLASE in the last 168 hours. No results for input(s): AMMONIA in the last 168  hours. Coagulation Profile: No results for input(s): INR, PROTIME in the last 168 hours. Cardiac Enzymes:  Recent Labs Lab 10/26/2016 1448 10/30/2016 1822 10/14/2016 2249 10/29/16 0430  TROPONINI 0.03* 0.03* 0.03* 0.03*   BNP (last 3 results) No results for input(s): PROBNP in the last 8760 hours. HbA1C: No results for input(s): HGBA1C in the last 72 hours. CBG: No results for input(s): GLUCAP in the last 168 hours. Lipid Profile: No results for input(s): CHOL, HDL, LDLCALC, TRIG, CHOLHDL, LDLDIRECT in the last 72 hours. Thyroid Function Tests: No results for input(s): TSH, T4TOTAL, FREET4, T3FREE, THYROIDAB in the last 72 hours. Anemia Panel: No results for input(s): VITAMINB12,  FOLATE, FERRITIN, TIBC, IRON, RETICCTPCT in the last 72 hours. Urine analysis:    Component Value Date/Time   COLORURINE AMBER (A) 10/29/2016 0805   APPEARANCEUR CLOUDY (A) 10/29/2016 0805   LABSPEC 1.021 10/29/2016 0805   PHURINE 5.0 10/29/2016 0805   GLUCOSEU NEGATIVE 10/29/2016 0805   HGBUR NEGATIVE 10/29/2016 0805   BILIRUBINUR NEGATIVE 10/29/2016 0805   KETONESUR NEGATIVE 10/29/2016 0805   PROTEINUR 30 (A) 10/29/2016 0805   UROBILINOGEN 0.2 04/30/2011 1340   NITRITE NEGATIVE 10/29/2016 0805   LEUKOCYTESUR SMALL (A) 10/29/2016 0805   Sepsis Labs: @LABRCNTIP (procalcitonin:4,lacticidven:4)  ) Recent Results (from the past 240 hour(s))  Urine culture     Status: Abnormal   Collection Time: 10/31/2016  4:29 PM  Result Value Ref Range Status   Specimen Description URINE, RANDOM  Final   Special Requests NONE  Final   Culture (A)  Final    <10,000 COLONIES/mL INSIGNIFICANT GROWTH Performed at [facility]    Report Status 10/29/2016 FINAL  Final  Culture, blood (routine x 2)     Status: None   Collection Time: 10/29/16  9:08 AM  Result Value Ref Range Status   Specimen Description BLOOD RIGHT ARM  Final   Special Requests BOTTLES DRAWN AEROBIC AND ANAEROBIC 5CC  Final   Culture    Final    NO GROWTH 5 DAYS Performed at [facility]    Report Status 11/03/2016 FINAL  Final  Culture, blood (routine x 2)     Status: None   Collection Time: 10/29/16  9:11 AM  Result Value Ref Range Status   Specimen Description BLOOD RIGHT HAND  Final   Special Requests BOTTLES DRAWN AEROBIC AND ANAEROBIC 5CC  Final   Culture   Final    NO GROWTH 5 DAYS Performed at [facility]    Report Status 11/03/2016 FINAL  Final      Studies: No results found.  Scheduled Meds: . atorvastatin  40 mg Oral QPM  . budesonide (PULMICORT) nebulizer solution  0.5 mg Nebulization BID  . enoxaparin (LOVENOX) injection  100 mg Subcutaneous BID  . feeding supplement (ENSURE ENLIVE)  237 mL Oral TID BM  . furosemide  40 mg Intravenous Q12H  . guaiFENesin  600 mg Oral BID  . ipratropium  0.5 mg Nebulization TID  . levalbuterol  1.25 mg Nebulization TID  . methylPREDNISolone (SOLU-MEDROL) injection  30 mg Intravenous Q12H  . metoprolol  5 mg Intravenous Q6H  . multivitamin with minerals  1 tablet Oral Daily  . potassium chloride  40 mEq Oral BID  . rOPINIRole  1 mg Oral QHS    Continuous Infusions:   LOS: 6 days     Annita Brod, MD [facility] Pager [phone]  If 7PM-7AM, please contact night-coverage [email or web address] Password [email or web address] 11/04/2016, 10:53 AM

## 2016-11-04 NOTE — Progress Notes (Signed)
Temp back up to 99.9 AXillary, HR remains 135 to 145, RR imporved 24, low 90's on bipap but is constantly pulling off mask even with mittens.  CN and AC notified need for sitter and am told there are none available.  Have had the NT as frequently as possible.  Have now asked K. Schorr for SD order and am awaiting reply.

## 2016-11-04 DEATH — deceased

## 2016-11-05 ENCOUNTER — Telehealth: Payer: Self-pay | Admitting: Oncology

## 2016-11-05 ENCOUNTER — Ambulatory Visit: Payer: Medicare Other | Admitting: Radiation Oncology

## 2016-11-05 ENCOUNTER — Telehealth: Payer: Self-pay | Admitting: Internal Medicine

## 2016-11-05 DIAGNOSIS — R0682 Tachypnea, not elsewhere classified: Secondary | ICD-10-CM

## 2016-11-05 DIAGNOSIS — Z515 Encounter for palliative care: Secondary | ICD-10-CM

## 2016-11-05 LAB — CBC
HCT: 37.2 % (ref 36.0–46.0)
Hemoglobin: 10.7 g/dL — ABNORMAL LOW (ref 12.0–15.0)
MCH: 25.3 pg — AB (ref 26.0–34.0)
MCHC: 28.8 g/dL — AB (ref 30.0–36.0)
MCV: 87.9 fL (ref 78.0–100.0)
PLATELETS: 200 10*3/uL (ref 150–400)
RBC: 4.23 MIL/uL (ref 3.87–5.11)
RDW: 17.7 % — ABNORMAL HIGH (ref 11.5–15.5)
WBC: 18.2 10*3/uL — ABNORMAL HIGH (ref 4.0–10.5)

## 2016-11-05 LAB — BASIC METABOLIC PANEL
Anion gap: 12 (ref 5–15)
BUN: 44 mg/dL — AB (ref 6–20)
CO2: 35 mmol/L — ABNORMAL HIGH (ref 22–32)
CREATININE: 0.7 mg/dL (ref 0.44–1.00)
Calcium: 9.3 mg/dL (ref 8.9–10.3)
Chloride: 101 mmol/L (ref 101–111)
Glucose, Bld: 184 mg/dL — ABNORMAL HIGH (ref 65–99)
POTASSIUM: 3.3 mmol/L — AB (ref 3.5–5.1)
SODIUM: 148 mmol/L — AB (ref 135–145)

## 2016-11-05 LAB — BLOOD GAS, ARTERIAL
ACID-BASE EXCESS: 11.5 mmol/L — AB (ref 0.0–2.0)
BICARBONATE: 37.4 mmol/L — AB (ref 20.0–28.0)
DRAWN BY: 129711
O2 Content: 8 L/min
O2 Saturation: 94.7 %
PH ART: 7.441 (ref 7.350–7.450)
Patient temperature: 98.6
pCO2 arterial: 55.8 mmHg — ABNORMAL HIGH (ref 32.0–48.0)
pO2, Arterial: 80.6 mmHg — ABNORMAL LOW (ref 83.0–108.0)

## 2016-11-05 LAB — ALBUMIN: ALBUMIN: 2.5 g/dL — AB (ref 3.5–5.0)

## 2016-11-05 MED ORDER — HYDROMORPHONE HCL 1 MG/ML IJ SOLN
0.5000 mg | INTRAMUSCULAR | Status: DC | PRN
  Administered 2016-11-05 – 2016-11-06 (×7): 0.5 mg via INTRAVENOUS
  Filled 2016-11-05 (×7): qty 0.5

## 2016-11-05 MED ORDER — ONDANSETRON 4 MG PO TBDP: 4.0000 mg | ORAL_TABLET | Freq: Four times a day (QID) | ORAL | Status: DC | PRN

## 2016-11-05 MED ORDER — ACETAMINOPHEN 325 MG PO TABS: 650.0000 mg | ORAL_TABLET | Freq: Four times a day (QID) | ORAL | Status: DC | PRN

## 2016-11-05 MED ORDER — POLYVINYL ALCOHOL 1.4 % OP SOLN: 1.0000 [drp] | Freq: Four times a day (QID) | OPHTHALMIC | Status: DC | PRN

## 2016-11-05 MED ORDER — MORPHINE SULFATE (CONCENTRATE) 10 MG/0.5ML PO SOLN: 5.0000 mg | ORAL | Status: DC | PRN

## 2016-11-05 MED ORDER — HALOPERIDOL LACTATE 2 MG/ML PO CONC: 0.5000 mg | ORAL | Status: DC | PRN

## 2016-11-05 MED ORDER — BIOTENE DRY MOUTH MT LIQD: 15.0000 mL | OROMUCOSAL | Status: DC | PRN

## 2016-11-05 MED ORDER — GLYCOPYRROLATE 0.2 MG/ML IJ SOLN
0.2000 mg | INTRAMUSCULAR | Status: DC | PRN
Start: 2016-11-05 — End: 2016-11-06

## 2016-11-05 MED ORDER — LORAZEPAM 1 MG PO TABS: 1.0000 mg | ORAL_TABLET | ORAL | Status: DC | PRN

## 2016-11-05 MED ORDER — GLYCOPYRROLATE 0.2 MG/ML IJ SOLN: 0.2000 mg | INTRAMUSCULAR | Status: DC | PRN

## 2016-11-05 MED ORDER — HALOPERIDOL LACTATE 5 MG/ML IJ SOLN: 0.5000 mg | INTRAMUSCULAR | Status: DC | PRN

## 2016-11-05 MED ORDER — LORAZEPAM 2 MG/ML PO CONC: 1.0000 mg | ORAL | Status: DC | PRN

## 2016-11-05 MED ORDER — HALOPERIDOL 0.5 MG PO TABS: 0.5000 mg | ORAL_TABLET | ORAL | Status: DC | PRN

## 2016-11-05 MED ORDER — LORAZEPAM 2 MG/ML IJ SOLN: 1.0000 mg | INTRAMUSCULAR | Status: DC | PRN

## 2016-11-05 MED ORDER — LEVALBUTEROL HCL 0.63 MG/3ML IN NEBU
INHALATION_SOLUTION | RESPIRATORY_TRACT | Status: AC
  Filled 2016-11-05: qty 3

## 2016-11-05 MED ORDER — ONDANSETRON HCL 4 MG/2ML IJ SOLN: 4.0000 mg | Freq: Four times a day (QID) | INTRAMUSCULAR | Status: DC | PRN

## 2016-11-05 MED ORDER — HYDROMORPHONE HCL 1 MG/ML IJ SOLN
0.5000 mg | INTRAMUSCULAR | Status: AC
  Administered 2016-11-05: 0.5 mg via INTRAVENOUS
  Filled 2016-11-05: qty 0.5

## 2016-11-05 MED ORDER — ACETAMINOPHEN 650 MG RE SUPP: 650.0000 mg | Freq: Four times a day (QID) | RECTAL | Status: DC | PRN

## 2016-11-05 MED ORDER — DIPHENHYDRAMINE HCL 50 MG/ML IJ SOLN: 12.5000 mg | INTRAMUSCULAR | Status: DC | PRN

## 2016-11-05 MED ORDER — GLYCOPYRROLATE 1 MG PO TABS: 1.0000 mg | ORAL_TABLET | ORAL | Status: DC | PRN

## 2016-11-05 NOTE — Progress Notes (Signed)
OT Cancellation Note  Patient Details Name: [name] MRN: [id number] DOB: 10/18/1946   Cancelled Treatment:     Pt. Is very lethargic today and nursing states she is waiting on a palliative care consult.   Graycie Halley 11/05/2016, 10:02 AM

## 2016-11-05 NOTE — Progress Notes (Signed)
No charge note.  Palliative following. On visit on 12/28, patient and family again restated GOC to be full, aggressive care.   Noted Occupational therapist note today that patient waiting for palliative consult. Plan to see patient this afternoon.  Mariana Kaufman, AGNP-C Palliative Medicine  Please call Palliative Medicine team phone with any questions [phone]. For individual providers please see AMION.

## 2016-11-05 NOTE — Telephone Encounter (Signed)
Not able to reach patient re next appointment for 1/4. Will call back tomorrow.

## 2016-11-05 NOTE — Progress Notes (Signed)
Speech Language Pathology Discharge Patient Details Name: [name] MRN: [id number] DOB: 22-May-1946 Today's Date: 11/05/2016 Time:  -     Patient discharged from SLP services secondary to MD cancelled order  Pt now comfort care.  Please see latest therapy progress note for current level of functioning and progress toward goals.    Progress and discharge plan discussed with patient and/or caregiver:  No   GO Luanna Salk, [facility] SLP [phone]     11/05/2016, 3:56 PM

## 2016-11-05 NOTE — Progress Notes (Signed)
PT Cancellation Note  Patient Details Name: Cailie Bosshart MRN: [id number] DOB: 12-27-1945   Cancelled Treatment:     per RN pt would be unable to participate.  Lethargic and medically declining.   Rica Koyanagi  PTA [facility]  Acute  Rehab Pager      [phone]

## 2016-11-05 NOTE — Telephone Encounter (Signed)
Caren Macadam, RN on [place] to see if Allice would like to have her CT Simulation today.  Beth said Le is currently sleeping and she is using 8L of oxygen.  She will check with [place] and will call us back.

## 2016-11-05 NOTE — Progress Notes (Signed)
PROGRESS NOTE  [name] TFT:[id number] DOB: 1945-11-19 DOA: 10/24/2016 PCP: Merrilee Seashore, MD  HPI/Recap of past 24 hours: 71 year female past medical history metastatic non-small cell lung cancer with recurrent malignant right pleural effusion plus COPD and chronic respiratory failure on 3 L nasal cannula plus rheumatoid arthritis admitted on 12/25 after a fall in the bathroom and on admission, noted to have a white count of 14.6 felt to be septic secondary to pneumonia.  Over the next few days, patient responded to IV antibiotics, increased oxygen and steroids. She is also found to have a subsegmental PE. In the last few days, patient's breathing has somewhat improved, able to be off of BiPAP and now on 5 L nasal cannula. Palliative care consulted-after meeting with patient, she will wanted full measures and aggressive therapy. Transfer to telemetry bed on 12/30.  By 12/31, patient began to decline. Increased work of breathing. Initially thought to be volume overloaded and was given Lasix which she initially put out some fluid, but then this tapered off. Breathing continued to worsen and patient required now up to 8 L. Chest x-ray only noted improvement. She became more and more confused and somnolent. After discussion with patient's sister by attending physician as well as hospice and palliative care, decision was made to make patient comfort care.  Assessment/Plan:    Essential hypertension: Medications stopped now that she is comfort care   Hypokalemia: Initially replaced. Now she is comfort care.  Back pain: Secondary to metastases. Medications now adjusted for comfort care, IV only    Palliative care by specialist   Malignant neoplasm of lung ([place]) from non-small cell carcinoma of the lung: Unfortunately, she has taken a turn for the worse and is now comfort care.    Acute respiratory failure (HCC) with hypoxia secondary to pneumonia, COPD exacerbation, PE and malignant spread  of lung cancer and now acute diastolic heart failure: Patient likely now with cor pulmonale given all of her pulmonary issues. She is now been changed to comfort care    Protein-calorie malnutrition, severe: Patient meets criteria given acute illness. Nutrition seen, on ensure 3 times a day  Sepsis secondary to community-acquired pneumonia: Patient met criteria for sepsis on admission given tachycardia, tachypnea, fever and pneumonia source. Initially on broad-spectrum antibiotics and as blood cultures were negative, vancomycin discontinued. Sepsis now felt to be resolved. Antibiotics stopped for comfort care  Acute pulmonary embolus: Nonocclusive. Anticoagulation discontinued for comfort care  Code Status: DO NOT RESUSCITATE/Comfort Care  Family Communication: Spoke with sister at the bedside  Disposition Plan: Patient now comfort care. She may pass shortly. If she does not, referral to residential hospice   Consultants:  Oncology  Palliative care   Procedures:  None   Antimicrobials:  IV vancomycin 12/26-1/2  IV cefepime and 12/26-1/2  DVT prophylaxis: None. Now comfort care   Objective: Vitals:   11/05/16 0700 11/05/16 0800 11/05/16 1248 11/05/16 1400  BP: 135/72   126/61  Pulse: (!) 131   (!) 126  Resp: (!) 22 (!) 24  (!) 22  Temp: 97.5 F (36.4 C)   98.4 F (36.9 C)  TempSrc: Oral   Oral  SpO2: 94% 95% 96% 92%  Weight:      Height:        Intake/Output Summary (Last 24 hours) at 11/05/16 1500 Last data filed at 11/05/16 1300  Gross per 24 hour  Intake               60  ml  Output             1850 ml  Net            -1790 ml   Filed Weights   11/01/16 0000 11/04/16 0947 11/05/16 0416  Weight: 104.7 kg (230 lb 13.2 oz) 95 kg (209 lb 7 oz) 94.4 kg (208 lb 1.8 oz)    Exam:   General:  Lethargic, nontender responsive, moderate distress from labored breathing  Cardiovascular: Regular rhythm, tachycardic  Respiratory: Decreased breath sounds  throughout, no crackles or wheezes  Abdomen: Soft, nontender, nondistended, positive bowel sounds   Musculoskeletal: No clubbing or cyanosis or edema   Psychiatry: Decreased level of consciousness   Data Reviewed: CBC:  Recent Labs Lab 11/01/16 0314 11/02/16 0306 11/03/16 0517 11/04/16 0515 11/05/16 0333  WBC 14.5* 11.6* 17.3* 15.9* 18.2*  HGB 9.4* 9.8* 11.0* 10.5* 10.7*  HCT 30.4* 32.2* 36.1 35.5* 37.2  MCV 85.4 84.7 85.7 86.0 87.9  PLT 363 320 261 201 454   Basic Metabolic Panel:  Recent Labs Lab 10/30/16 0055 11/02/16 0306 11/03/16 0516 11/04/16 0515 11/05/16 0333  NA 145 142 142 145 148*  K 4.0 2.6* 2.9* 3.1* 3.3*  CL 108 92* 98* 99* 101  CO2 29 37* 34* 34* 35*  GLUCOSE 168* 172* 134* 158* 184*  BUN _0 27* 44*  CREATININE 0.55 0.45 0.46 0.43* 0.70  CALCIUM 9.0 8.5* 9.0 9.1 9.3  MG  --  1.7  --  1.9  --    GFR: Estimated Creatinine Clearance: 73.6 mL/min (by C-G formula based on SCr of 0.7 mg/dL). Liver Function Tests:  Recent Labs Lab 10/30/16 0055 11/05/16 0331  AST 42*  --   ALT 25  --   ALKPHOS 91  --   BILITOT 0.9  --   PROT 5.8*  --   ALBUMIN 2.3* 2.5*   No results for input(s): LIPASE, AMYLASE in the last 168 hours. No results for input(s): AMMONIA in the last 168 hours. Coagulation Profile: No results for input(s): INR, PROTIME in the last 168 hours. Cardiac Enzymes: No results for input(s): CKTOTAL, CKMB, CKMBINDEX, TROPONINI in the last 168 hours. BNP (last 3 results) No results for input(s): PROBNP in the last 8760 hours. HbA1C: No results for input(s): HGBA1C in the last 72 hours. CBG: No results for input(s): GLUCAP in the last 168 hours. Lipid Profile: No results for input(s): CHOL, HDL, LDLCALC, TRIG, CHOLHDL, LDLDIRECT in the last 72 hours. Thyroid Function Tests: No results for input(s): TSH, T4TOTAL, FREET4, T3FREE, THYROIDAB in the last 72 hours. Anemia Panel: No results for input(s): VITAMINB12, FOLATE, FERRITIN,  TIBC, IRON, RETICCTPCT in the last 72 hours. Urine analysis:    Component Value Date/Time   COLORURINE AMBER (A) 10/29/2016 0805   APPEARANCEUR CLOUDY (A) 10/29/2016 0805   LABSPEC 1.021 10/29/2016 0805   PHURINE 5.0 10/29/2016 0805   GLUCOSEU NEGATIVE 10/29/2016 0805   HGBUR NEGATIVE 10/29/2016 0805   BILIRUBINUR NEGATIVE 10/29/2016 0805   KETONESUR NEGATIVE 10/29/2016 0805   PROTEINUR 30 (A) 10/29/2016 0805   UROBILINOGEN 0.2 04/30/2011 1340   NITRITE NEGATIVE 10/29/2016 0805   LEUKOCYTESUR SMALL (A) 10/29/2016 0805   Sepsis Labs: _1 (procalcitonin:4,lacticidven:4)  ) Recent Results (from the past 240 hour(s))  Urine culture     Status: Abnormal   Collection Time: 10/19/2016  4:29 PM  Result Value Ref Range Status   Specimen Description URINE, RANDOM  Final   Special Requests NONE  Final  Culture (A)  Final    <10,000 COLONIES/mL INSIGNIFICANT GROWTH Performed at [facility]    Report Status 10/29/2016 FINAL  Final  Culture, blood (routine x 2)     Status: None   Collection Time: 10/29/16  9:08 AM  Result Value Ref Range Status   Specimen Description BLOOD RIGHT ARM  Final   Special Requests BOTTLES DRAWN AEROBIC AND ANAEROBIC 5CC  Final   Culture   Final    NO GROWTH 5 DAYS Performed at [facility]    Report Status 11/03/2016 FINAL  Final  Culture, blood (routine x 2)     Status: None   Collection Time: 10/29/16  9:11 AM  Result Value Ref Range Status   Specimen Description BLOOD RIGHT HAND  Final   Special Requests BOTTLES DRAWN AEROBIC AND ANAEROBIC 5CC  Final   Culture   Final    NO GROWTH 5 DAYS Performed at [facility]    Report Status 11/03/2016 FINAL  Final      Studies: No results found.  Scheduled Meds: .  HYDROmorphone (DILAUDID) injection  0.5 mg Intravenous NOW  . levalbuterol        Continuous Infusions:   LOS: 7 days     Annita Brod, MD [facility] Pager [phone]  If 7PM-7AM,  please contact night-coverage [email or web address] Password [email or web address] 11/05/2016, 3:00 PM

## 2016-11-05 NOTE — Progress Notes (Signed)
CSW was following for SNF placement, though reviewed Palliative/attending note indicating possible hospital death. Please contact CSW for residential hospice placement if appropriate.    Raynaldo Opitz, [facility] Clinical Social Worker cell #: [phone]

## 2016-11-05 NOTE — Progress Notes (Addendum)
Daily Progress Note   Patient Name: [name]       Date: 11/05/2016 DOB: Sep 06, 1946  Age: 71 y.o. MRN#: [id number] Attending Physician: Annita Brod, MD Primary Care Physician: Merrilee Seashore, MD Admit Date: 10/08/2016  Reason for Consultation/Follow-up: Establishing goals of care  Subjective: Noted patient has declined over weekend. Currently on telemetry, but transfer to stepdown and bipap ordered. Met with patient's sister, Baker Janus. Discussed disease trajectory, complications (metastatic lung ca with possible lymphangitic spread, recurrent malignant pleural effusion, PE, sepsis, malnutrition) and possible option of comfort care. She noted that before patient had altered mental status she stated she did not want CPR if her heart stopped, did not want intubation, but Bipap mask is ok. Limited code ordered. Patient's niece is on the way to hospital and Baker Janus wishes to discuss possible comfort care with her before making further decisions. Baker Janus has my contact information and will contact me after discussing with her daughter. I inquired re: discussing with patient's significant other: Dorian Pod, however, Baker Janus states that Dorian Pod "does not handle things well" and states that she will discuss with Dorian Pod.   I met a second time with patient's sister and niece. Decision made to transition to comfort care.   Review of Systems  Unable to perform ROS: Mental status change    Length of Stay: 7  Current Medications: Scheduled Meds:  . levalbuterol        Continuous Infusions:   PRN Meds: acetaminophen **OR** acetaminophen, antiseptic oral rinse, diphenhydrAMINE, glycopyrrolate **OR** glycopyrrolate **OR** glycopyrrolate, haloperidol **OR** haloperidol **OR** haloperidol lactate, HYDROmorphone  (DILAUDID) injection, LORazepam **OR** LORazepam **OR** LORazepam, morphine CONCENTRATE **OR** morphine CONCENTRATE, ondansetron **OR** ondansetron (ZOFRAN) IV, polyvinyl alcohol  Physical Exam  Constitutional: She appears well-developed. She appears distressed.  Cardiovascular:  tachycardic  Pulmonary/Chest: She has rales.  labored  Abdominal: Soft. Bowel sounds are normal.  Mildly distended  Musculoskeletal: Normal range of motion.  Neurological:  Somnolent, does not respond verbally   Skin: There is pallor.  Multiple scattered bruises  Psychiatric:  lethargic  Nursing note and vitals reviewed.           Vital Signs: BP 126/61 (BP Location: Right Arm)   Pulse (!) 126 Comment: informed RN   Temp 98.4 F (36.9 C) (Oral)   Resp (!) 22 Comment: informed RN   Ht  5' 6"  (1.676 m)   Wt 94.4 kg (208 lb 1.8 oz)   SpO2 92%   BMI 33.59 kg/m  SpO2: SpO2: 92 % O2 Device: O2 Device: Nasal Cannula O2 Flow Rate: O2 Flow Rate (L/min): 6 L/min  Intake/output summary:   Intake/Output Summary (Last 24 hours) at 11/05/16 1529 Last data filed at 11/05/16 1300  Gross per 24 hour  Intake               60 ml  Output             1850 ml  Net            -1790 ml   LBM: Last BM Date: 11/04/16 Baseline Weight: Weight: 100.7 kg (222 lb 0.1 oz) Most recent weight: Weight: 94.4 kg (208 lb 1.8 oz)       Palliative Assessment/Data: PPS: 10%    Flowsheet Rows   Flowsheet Row Most Recent Value  Intake Tab  Referral Department  Hospitalist  Unit at Time of Referral  ICU  Palliative Care Primary Diagnosis  Cancer  Palliative Care Type  New Palliative care  Reason for referral  Clarify Goals of Care  Date of Admission  10/05/2016  Date first seen by Palliative Care  10/29/16  Clinical Assessment  Palliative Performance Scale Score  10%  Psychosocial & Spiritual Assessment  Social Work Plan of Care  Participated in [employer], Education on Hospice, Advance care [place]  Patient/Family meeting held?  Yes  Who was at the meeting?  Patient's sisterBaker Janus and Niece- Dara  Palliative Care Outcomes  Clarified goals of care, Improved non-pain symptom therapy, Provided end of life care assistance, Changed to focus on comfort, Transitioned to hospice, Changed CPR status, Provided advance care planning  Patient/Family wishes: Interventions discontinued/not started   Mechanical Ventilation, BiPAP, Hemodialysis, Transfusion, Vasopressors, Transfer out of ICU, PEG, Trach, NIPPV, Tube feedings/TPN, Antibiotics  Palliative Care follow-up planned  Yes, Facility      Patient Active Problem List   Diagnosis Date Noted  . Sepsis ([place])   . Protein-calorie malnutrition, severe 10/31/2016  . Pulmonary embolus ([place])   . SOB (shortness of breath)   . Palliative care by specialist   . Malignant neoplasm of lung ([place])   . Acute respiratory failure ([place])   . Weakness 10/12/2016  . Non-small cell carcinoma of lung ([place]) 10/14/2016  . Encounter for antineoplastic chemotherapy 10/24/2016  . Goals of care, counseling/discussion 10/24/2016  . Back pain 10/24/2016  . COPD exacerbation ([place]) 10/14/2016  . Pleural effusion 10/14/2016  . Malignant pleural effusion 10/08/2016  . Adenocarcinoma of lung, right ([place]) 10/08/2016  . Essential hypertension 10/02/2016  . Acute on chronic respiratory failure with hypoxia ([place]) 10/02/2016  . Recurrent right pleural effusion 10/02/2016  . Leukocytosis 10/02/2016  . Hypokalemia 10/02/2016  . Right lower lobe pneumonia ([place]) 10/02/2016  . Thyroid nodule 10/02/2016  . Adenocarcinoma of right lung, stage 4 ([place]) 10/02/2016  . Chest pain in adult   . Exudative pleural effusion   . Lung nodule seen on imaging study 09/26/2016  . COPD GOLD II 12/03/2013    Palliative Care Assessment & Plan   Patient Profile:  71 y.o. female  with past medical history of COPD, newly diagnosed lung ca (Stage IV, NSC, recurrent malignant pleural  effusion, liver, left adrenal and bone mets, scheduled to start radiation tomorrow and first cycle of palliative chemotherapy on 12/28) admitted on 10/23/2016 with general increasing weakness.  Workup reveals sepsis likely r/t pneumonia (now on Bipap), and possibly lymphangitic spread of lung ca. CT angio from 12/26 showing small volume PE. Patient temporarily weaned from bipap and was improving, however, now is rapidly declining again, has had change in mental status and labored breathing.  Assessment/Recommendations/Plan   DNR  Comfort measures:  -Symptom management for SOB:   -Dilaudid .63m IV q370m prn severe SOB, or indications of pain  -Fan blowing directly in face  -Keep room cool Will consider opiate drip after 24 hour requirements with comfort measures in place If patient survives this evening, will refer for residential hospice  Lorazepam 50m72m4 hours prn anxiety  Haldol .5mg32m q4hrs agitation  Delirium precautions ordered   Goals of Care and Additional Recommendations: Limitations on Scope of Treatment: Full comfort care   Code Status: DNR -  Prognosis:   Hours - Days due to advanced metastatic NSCLC with recurrent malignant pleural effusion, concern for lymphangitic spread, PE, poor nutritional status, transition to comfort measures only  Discharge Planning:  To Be Determined  Care plan was discussed with patient's sister and Dr. KrisMaryland Pinkank you for allowing the Palliative Medicine Team to assist in the care of this patient.   Time In: 1330 Time Out: 1455 Total Time  85 mins Prolonged Time Billed No      Greater than 50%  of this time was spent counseling and coordinating care related to the above assessment and plan.  KasiMariana KaufmanNP-C Palliative Medicine   Please contact Palliative Medicine Team phone at [phone] questions and concerns.

## 2016-11-06 MED ORDER — HYDROMORPHONE BOLUS VIA INFUSION
0.5000 mg | INTRAVENOUS | Status: DC | PRN
  Administered 2016-11-06 (×2): 0.5 mg via INTRAVENOUS
  Filled 2016-11-06: qty 1

## 2016-11-06 MED ORDER — SODIUM CHLORIDE 0.9 % IV SOLN
0.2500 mg/h | INTRAVENOUS | Status: DC
  Administered 2016-11-06: 0.25 mg/h via INTRAVENOUS
  Filled 2016-11-06: qty 2.5

## 2016-11-07 ENCOUNTER — Other Ambulatory Visit: Payer: Medicare Other

## 2016-11-08 ENCOUNTER — Other Ambulatory Visit: Payer: Self-pay | Admitting: Nurse Practitioner

## 2016-11-11 ENCOUNTER — Ambulatory Visit: Payer: Medicare Other

## 2016-11-11 ENCOUNTER — Other Ambulatory Visit: Payer: Medicare Other

## 2016-11-18 ENCOUNTER — Other Ambulatory Visit: Payer: Medicare Other

## 2016-11-19 ENCOUNTER — Other Ambulatory Visit: Payer: Medicare Other

## 2016-11-19 ENCOUNTER — Ambulatory Visit: Payer: Medicare Other | Admitting: Internal Medicine

## 2016-11-25 ENCOUNTER — Other Ambulatory Visit: Payer: Medicare Other

## 2016-12-02 ENCOUNTER — Ambulatory Visit: Payer: Medicare Other | Admitting: Internal Medicine

## 2016-12-02 ENCOUNTER — Ambulatory Visit: Payer: Medicare Other

## 2016-12-02 ENCOUNTER — Other Ambulatory Visit: Payer: Medicare Other

## 2016-12-05 NOTE — Progress Notes (Signed)
Daily Progress Note   Patient Name: [name]       Date: 2016-11-22 DOB: [birth year]  Age: 71 y.o. MRN#: [id number] Attending Physician: Annita Brod, MD Primary Care Physician: Merrilee Seashore, MD Admit Date: 11/01/2016  Reason for Consultation/Follow-up: Non pain symptom management, Terminal Care and Withdrawal of life-sustaining treatment  Subjective: Patient required several pushes of IV hydromorphone overnight for symptom management- SOB, pain. Family is requesting referral to residential hospice in [facility] for symptom management at end of life.  Review of Systems  Unable to perform ROS: Patient unresponsive    Length of Stay: 8  Current Medications: Scheduled Meds:    Continuous Infusions: . HYDROmorphone      PRN Meds: acetaminophen **OR** acetaminophen, antiseptic oral rinse, diphenhydrAMINE, glycopyrrolate **OR** glycopyrrolate **OR** glycopyrrolate, haloperidol **OR** haloperidol **OR** haloperidol lactate, HYDROmorphone, LORazepam **OR** LORazepam **OR** LORazepam, morphine CONCENTRATE **OR** morphine CONCENTRATE, ondansetron **OR** ondansetron (ZOFRAN) IV, polyvinyl alcohol  Physical Exam  Constitutional: She appears well-developed and well-nourished.  Cardiovascular: Intact distal pulses.   tachycardic  Pulmonary/Chest: She has rales.  Neurological:  Non responsive  Nursing note and vitals reviewed.           Vital Signs: BP 122/64 (BP Location: Left Arm)   Pulse (!) 143   Temp 99.5 F (37.5 C) (Axillary)   Resp 20   Ht '5\' 6"'$  (1.676 m)   Wt 94.4 kg (208 lb 1.8 oz)   SpO2 92%   BMI 33.59 kg/m  SpO2: SpO2: 92 % O2 Device: O2 Device: Nasal Cannula O2 Flow Rate: O2 Flow Rate (L/min): 6 L/min  Intake/output summary:  Intake/Output Summary (Last  24 hours) at November 22, 2016 1124 Last data filed at 11/05/16 1300  Gross per 24 hour  Intake                0 ml  Output              400 ml  Net             -400 ml   LBM: Last BM Date: 11/04/16 Baseline Weight: Weight: 100.7 kg (222 lb 0.1 oz) Most recent weight: Weight: 94.4 kg (208 lb 1.8 oz)       Palliative Assessment/Data: PPS: 10%    Flowsheet Rows   Flowsheet Row Most Recent Value  Intake  Tab  Referral Department  Hospitalist  Unit at Time of Referral  ICU  Palliative Care Primary Diagnosis  Cancer  Palliative Care Type  New Palliative care  Reason for referral  Clarify Goals of Care  Date of Admission  10/13/2016  Date first seen by Palliative Care  10/29/16  Clinical Assessment  Palliative Performance Scale Score  10%  Psychosocial & Spiritual Assessment  Social Work Plan of Care  Participated in [employer], Education on Hospice, Advance care planning  Palliative Care Outcomes  Patient/Family meeting held?  Yes  Who was at the meeting?  Patient's sisterBaker Janus and Niece- Dara  Palliative Care Outcomes  Clarified goals of care, Improved non-pain symptom therapy, Provided end of life care assistance, Changed to focus on comfort, Transitioned to hospice, Changed CPR status, Provided advance care planning  Patient/Family wishes: Interventions discontinued/not started   Mechanical Ventilation, BiPAP, Hemodialysis, Transfusion, Vasopressors, Transfer out of ICU, PEG, Trach, NIPPV, Tube feedings/TPN, Antibiotics  Palliative Care follow-up planned  Yes, Facility      Patient Active Problem List   Diagnosis Date Noted  . Terminal care   . Tachypnea   . Sepsis ([place])   . Protein-calorie malnutrition, severe 10/31/2016  . Pulmonary embolus ([place])   . SOB (shortness of breath)   . Palliative care by specialist   . Malignant neoplasm of lung ([place])   . Acute respiratory failure ([place])   . Weakness 10/17/2016  . Non-small cell carcinoma of lung ([place]) 11/02/2016  . Encounter  for antineoplastic chemotherapy 10/24/2016  . Goals of care, counseling/discussion 10/24/2016  . Back pain 10/24/2016  . COPD exacerbation ([place]) 10/14/2016  . Pleural effusion 10/14/2016  . Malignant pleural effusion 10/08/2016  . Adenocarcinoma of lung, right ([place]) 10/08/2016  . Essential hypertension 10/02/2016  . Acute on chronic respiratory failure with hypoxia ([place]) 10/02/2016  . Recurrent right pleural effusion 10/02/2016  . Leukocytosis 10/02/2016  . Hypokalemia 10/02/2016  . Right lower lobe pneumonia ([place]) 10/02/2016  . Thyroid nodule 10/02/2016  . Adenocarcinoma of right lung, stage 4 ([place]) 10/02/2016  . Chest pain in adult   . Exudative pleural effusion   . Lung nodule seen on imaging study 09/26/2016  . COPD GOLD II 12/03/2013    Palliative Care Assessment & Plan   Patient Profile: 71 yo female with recently diagnosed metastatic lung cancer (mets to lungs, liver, adrenals, malignant pleural effusion, concern for lymphangitic spread on CT) admitted for SOB. Workup revealed pneumonia, sepsis and PE. She has continued to decline over the past several days with increased work of breathing and changes in mental status. Family decided to transition to comfort care.   Assessment/Recommendations/Plan   Hydromorphone continuous infusion- .'25mg'$ / hr with .'5mg'$  bolus q30 min for SOB or pain.  Continue other comfort medications as ordered.  Decrease O2 to 2L N/C- ok to d/c O2 if seems to aggravate patient. O2 at end of life is not considered comfort care. Treat SOB with opiate and benzodiazapine as ordered.  Referral to SW for residential hospice for symptom management at ed of life.   Goals of Care and Additional Recommendations:  Limitations on Scope of Treatment: Full Comfort Care  Code Status:  DNR  Prognosis:   < 2 weeks d/t advanced lung cancer, no plans for treatment, rapidly declining respiratory status, no po intake, patient actively dying  Discharge  Planning:  [place] was discussed with patient's sister, social work, and Therapist, sports.  Thank you for allowing the  Palliative Medicine Team to assist in the care of this patient.   Total time caring for patient: 32 minutes  Greater than 50%  of this time was spent counseling and coordinating care related to the above assessment and plan.  Mariana Kaufman, AGNP-C Palliative Medicine   Please contact Palliative Medicine Team phone at [phone] for questions and concerns.

## 2016-12-05 NOTE — Progress Notes (Addendum)
Sister called RN to room.  Patient without respirations.  Auscultation performed, no heart rate or pulse found.  Verified by a second nurse, Tye Savoy, RN.  Dr. [place] Pink has been notified.  Sister, Baker Janus is notifying other family members of patient's passing.

## 2016-12-05 NOTE — Progress Notes (Signed)
CSW received consult from [place], PMT NP that family expressed interest in [facility] in [place]. CSW confirmed with patient's sister, [name] & made referral to [facility] - awaiting response re: bed availability/eligibility.    Raynaldo Opitz, [facility] Clinical Social Worker cell #: [phone]

## 2016-12-05 NOTE — Care Management Important Message (Signed)
impImportant Message  Patient Details  Name: [name] MRN: [id number] Date of Birth: Jun 25, 1946   Medicare Important Message Given:  Yes    [name] 2016-11-19, 12:12 [place] Message  Patient Details  Name: [name] MRN: [id number] Date of Birth: Dec 12, 1945   Medicare Important Message Given:  Yes    [name] 11-19-2016, 12:12 PM

## 2016-12-05 NOTE — Discharge Summary (Addendum)
Death Summary  [name] OEV:[id number] DOB: 10-Jan-1946 DOA: 2016-11-02  PCP: Merrilee Seashore, MD  Admit date: 11-02-16 Date of Death: 11/11/16 Time of Death: 5:01pm Notification: Merrilee Seashore, MD notified of death through answering service on Nov 11, 2016   History of present illness:  71 year female past medical history metastatic non-small cell lung cancer with recurrent malignant right pleural effusion plus COPD and chronic respiratory failure on 3 L nasal cannula plus rheumatoid arthritis admitted on 2023/11/03 after a fall in the bathroom and on admission, noted to have a white count of 14.6 felt to be septic secondary to pneumonia.  Over the next few days, patient responded to IV antibiotics, increased oxygen and steroids. She is also found to have a subsegmental PE. In the last few days, patient's breathing has somewhat improved, able to be off of BiPAP and now on 5 L nasal cannula. Palliative care consulted-after meeting with patient, she will wanted full measures and aggressive therapy. Transfer to telemetry bed on 12/30.  By 12/31, patient began to decline. Increased work of breathing. Initially thought to be volume overloaded and was given Lasix which she initially put out some fluid, but then this tapered off. Breathing continued to worsen and patient required now up to 8 L. Chest x-ray only noted improvement. She became more and more confused and somnolent. After discussion with patient's sister by attending physician as well as hospice and palliative care, decision was made to make patient comfort care On the evening of 1/2.  Patient was given medication for pain and agitation plus supplemental oxygen. She passed away on the afternoon of 1/3  Final Diagnoses:  1.   Principal Problem:   Non-small cell carcinoma of lung (HCC) Active Problems:   COPD GOLD II   Essential hypertension   Hypokalemia   Goals of care, counseling/discussion   Back pain    Palliative care by  specialist   Malignant neoplasm of lung ([place])   Acute respiratory failure (HCC)   SOB (shortness of breath)   Protein-calorie malnutrition, severe   Pulmonary embolus ([place])   Sepsis ([place])   Terminal care   Tachypnea     The results of significant diagnostics from this hospitalization (including imaging, microbiology, ancillary and laboratory) are listed below for reference.    Significant Diagnostic Studies: Dg Chest 1 View  Result Date: 11/03/2016 CLINICAL DATA:  Tachypnea.  History of COPD and hypertension. EXAM: CHEST 1 VIEW COMPARISON:  11/01/2016 FINDINGS: Since prior exam, there has been improvement. Airspace opacity in the right mid and lower lung has improved. There is residual, persistent opacity that obscures the right hemidiaphragm consistent with a combination of infiltrate and pleural fluid. Left lung remains clear. IMPRESSION: 1. Improved right lung aeration as detailed above when compared the prior study. No new abnormalities. Electronically Signed   By: Lajean Manes M.D.   On: 11/03/2016 09:02   Dg Chest 2 View  Result Date: 02-Nov-2016 CLINICAL DATA:  Shortness of breath.  History COPD.  Former smoker. EXAM: CHEST  2 VIEW COMPARISON:  PET-CT 10/22/2016 FINDINGS: Cardiomediastinal silhouette is normal. Mediastinal contours appear intact. Calcific atherosclerotic disease of the aorta seen. There is no evidence of pneumothorax. Nodular densities overlying the right hemithorax likely represent loculated pleural effusion. Malignant appearing nodules demonstrated by recent PET-CT in the right upper and right middle lobe are poorly visualized. Mild interstitial thickening on the right. Osseous structures are without acute abnormality. Soft tissues are grossly normal. IMPRESSION: No significant change in findings suggestive of right lung  malignancy with potential lymphangitic spread of disease. Electronically Signed   By: Fidela Salisbury M.D.   On: 10/19/2016 15:04   Dg Chest 2  View  Result Date: 10/16/2016 CLINICAL DATA:  Follow-up pleural effusion . EXAM: CHEST  2 VIEW COMPARISON:  CT 10/14/2016.  Chest x-ray 10/14/2016 09/25/2016. FINDINGS: Mediastinum and hilar structures are normal. Heart size normal. Right lower lobe atelectasis and infiltrate. Small right pleural effusion. No acute bony abnormality identified. IMPRESSION: Right lower lobe atelectasis and infiltrate again noted. With small right pleural effusion noted. Similar findings noted on prior exam. No pneumothorax. Electronically Signed   By: Marcello Moores  Register   On: 10/16/2016 10:32   Dg Chest 2 View  Result Date: 10/14/2016 CLINICAL DATA:  Left-sided breast pain and shortness of breath. History of lung cancer. EXAM: CHEST  2 VIEW COMPARISON:  Chest radiograph 10/07/2016 and chest CT 09/24/2016 FINDINGS: Cardiomediastinal contours are unchanged. Small right pleural effusion with loculated component along the right lateral hemithorax is slightly decreased in size. Multiple nodular opacities overlying the right lung are unchanged. There is atherosclerotic calcification within the aortic arch. No pneumothorax, focal airspace consolidation or pulmonary edema. IMPRESSION: 1. No acute cardiopulmonary disease. 2. Decreased size of partially loculated right pleural effusion and unchanged multifocal nodular opacities of the right hemithorax. 3. Aortic atherosclerosis. Electronically Signed   By: Ulyses Jarred M.D.   On: 10/14/2016 14:12   Ct Angio Chest Pe W Or Wo Contrast  Result Date: 10/29/2016 CLINICAL DATA:  COPD with newly diagnosed lung cancer. EXAM: CT ANGIOGRAPHY CHEST WITH CONTRAST TECHNIQUE: Multidetector CT imaging of the chest was performed using the standard protocol during bolus administration of intravenous contrast. Multiplanar CT image reconstructions and MIPs were obtained to evaluate the vascular anatomy. CONTRAST:  90 cc Isovue 370 COMPARISON:  PET-CT 10/22/2016.  Chest CT 10/14/2016. FINDINGS:  Cardiovascular: Heart size upper normal. Coronary artery calcification is noted. Atherosclerotic calcification is noted in the wall of the thoracic aorta.String like small volume pulmonary embolus is identified in the left main pulmonary artery. There is borderline occlusive pulmonary embolus identified and subsegmental right lower lobe pulmonary arteries (see image 68 series 4). No evidence for right heart strain. Mediastinum/Nodes: Stable 8 mm short axis subcarinal lymph node. Stable 14 mm short axis right hilar lymph node. Lungs/Pleura: Loculated right pleural effusion with loculations in the right fissures, similar to prior. 9 mm right middle lobe pulmonary nodule is stable. 18 mm spiculated subpleural anterior right upper lobe pulmonary nodule, seen to be hypermetabolic on recent PET-CT also stable since previous. Fine detail a both lungs obscured by patient breathing motion. Upper Abdomen: Multiple liver lesions are stable, seen to be hypermetabolic on recent PET scan. No change 18 mm left adrenal nodule. Musculoskeletal: Multiple lytic thoracolumbar spine lesions consistent with metastatic disease, as described previously. Review of the MIP images confirms the above findings. IMPRESSION: 1. Small volume pulmonary embolus, nonocclusive left main pulmonary artery and involving subsegmental pulmonary artery to the right lower lobe. No evidence for right heart strain. 2. Otherwise stable exam with no change in the right pulmonary nodules, mediastinal and right hilar lymphadenopathy and loculated pleural fluid collection/disease. 3. Multiple liver lesions, seen to be hypermetabolic on previous PET imaging. 4. Stable left adrenal nodule 5. Bony metastatic involvement. I discussed these findings by telephone with Dr. Allyson Sabal at 1520 hours on 10/29/2016. Electronically Signed   By: Misty Stanley M.D.   On: 10/29/2016 15:24   Ct Angio Chest Pe W Or Wo Contrast  Result Date: 10/14/2016 CLINICAL DATA:  Short of  breath. Pleural drainage catheter removed 1 week prior. EXAM: CT ANGIOGRAPHY CHEST WITH CONTRAST TECHNIQUE: Multidetector CT imaging of the chest was performed using the standard protocol during bolus administration of intravenous contrast. Multiplanar CT image reconstructions and MIPs were obtained to evaluate the vascular anatomy. CONTRAST:  70 mL Isovue COMPARISON:  CT 09/24/2016 FINDINGS: Cardiovascular: Coronary artery calcification and aortic atherosclerotic calcification. No pericardial fluid. Mediastinum/Nodes: No axillary or supraclavicular adenopathy. No mediastinal adenopathy. Lungs/Pleura: There are multiple small pockets of loculated pleural fluid along the posterior aspect of the RIGHT hemithorax. Interval reduction of moderate dependent effusion effusion seen on comparison exam. Some fluid extends along the fissure. No pneumothorax. Rounded nodule in the anterior RIGHT middle lobe measuring 9 mm (image 63, series 11. Nodule in the anterior RIGHT upper lobe additionally measuring 16 mm on image 44, series. These 2 pulmonary nodules are not changed comparison exam. LEFT lung clear Upper Abdomen: Limited view of the liver, kidneys, pancreas are unremarkable. Normal adrenal glands. Musculoskeletal: No aggressive osseous lesion. Review of the MIP images confirms the above findings. IMPRESSION: 1. Multiple small pockets of loculated pleural fluid within the RIGHT hemithorax replaces the previous seen moderate pleural effusion. 2. Stable pulmonary nodules the RIGHT upper lobe and RIGHT middle lobe of undetermined etiology. 3. No pneumothorax Electronically Signed   By: Suzy Bouchard M.D.   On: 10/14/2016 17:00   Nm Pet Image Initial (pi) Skull Base To Thigh  Result Date: 10/22/2016 CLINICAL DATA:  Initial treatment strategy for right lung adenocarcinoma. EXAM: NUCLEAR MEDICINE PET SKULL BASE TO THIGH TECHNIQUE: 11.4 mCi F-18 FDG was injected intravenously. Full-ring PET imaging was performed from the  skull base to thigh after the radiotracer. CT data was obtained and used for attenuation correction and anatomic localization. FASTING BLOOD GLUCOSE:  Value: 160 mg/dl COMPARISON:  Chest CTA on 10/14/2016 FINDINGS: NECK No hypermetabolic lymph nodes in the neck. CHEST Small multiloculated right pleural effusion shows diffuse hypermetabolic activity, consistent with malignant pleural effusion. A spiculated nodule in the anterior right upper lobe abutting the pleural surface on image 52/7 is hypermetabolic, with SUV max of 12.2. A 10 mm smoothly marginated pulmonary nodule in the right middle lobe on image 48/7 shows mild hypermetabolism with SUV max of 4.4. No suspicious pulmonary nodule seen in the left lung. 8 mm subcarinal lymph node on image 78/2 is hypermetabolic with SUV max of 8.3. Hypermetabolic activity also seen in right hilum with SUV max of 12.2 ABDOMEN/PELVIS Multiple small hypermetabolic lesions are seen throughout the right and left hepatic lobes, consistent with diffuse liver metastases. 1.8 cm left adrenal mass is hypermetabolic with SUV max of 42.3, consistent with left adrenal metastasis. No other hypermetabolic masses or lymphadenopathy identified within the abdomen or pelvis. Colonic diverticulosis is noted, without evidence of diverticulitis. Previous hysterectomy. SKELETON Diffuse hypermetabolic lytic bone metastases are seen throughout the spine, bilateral ribs, bilateral scapulae and right humeral head, pelvis, and bilateral hips, consistent with diffuse bone metastases. Several pathologic right rib fractures are noted. IMPRESSION: 16 mm hypermetabolic spiculated nodule in the anterior right upper lobe, and 10 mm hypermetabolic smoothly marginated nodule in right middle lobe, which could represent primary bronchogenic carcinoma or pulmonary metastases. Small malignant multiloculated right pleural effusion. Small hypermetabolic right hilar and subcarinal mediastinal lymph nodes, consistent  with metastatic disease. Diffuse liver metastases. Small left adrenal metastasis. Diffuse lytic bone metastases. Several pathologic right rib fractures also noted. Electronically Signed   By: Jenny Reichmann  Kris Hartmann M.D.   On: 10/22/2016 11:53   Dg Chest Port 1 View  Result Date: 11/01/2016 CLINICAL DATA:  Shortness of breath. History of right breast cancer. COPD. EXAM: PORTABLE CHEST 1 VIEW COMPARISON:  CT 10/29/2016. FINDINGS: Loculated right pleural effusion again noted with airspace disease throughout the right lower lung, stable. Heart is mildly enlarged. No confluent opacity on the left. IMPRESSION: Stable loculated right pleural effusion with right lower lung airspace disease. Electronically Signed   By: Rolm Baptise M.D.   On: 11/01/2016 11:53   Dg Chest Port 1 View  Result Date: 10/29/2016 CLINICAL DATA:  Fever.  Unresponsive patient. EXAM: PORTABLE CHEST 1 VIEW COMPARISON:  Chest x-ray 10/19/2016.  Chest CT 10/14/2016 FINDINGS: 0813 hours. The cardio pericardial silhouette is enlarged. Bibasilar atelectasis/ infiltrate. Right pleural effusion with apparent loculation. The visualized bony structures of the thorax are intact. Telemetry leads overlie the chest. IMPRESSION: Cardiomegaly with bibasilar collapse/ consolidation and right greater than left pleural effusion, likely loculated. Electronically Signed   By: Misty Stanley M.D.   On: 10/29/2016 08:42   Dg Hip Unilat With Pelvis 2-3 Views Left  Result Date: 10/29/2016 CLINICAL DATA:  Hip pain.  Fall. EXAM: DG HIP (WITH OR WITHOUT PELVIS) 2-3V LEFT COMPARISON:  05/07/2011 . FINDINGS: Diffuse osteopenia degenerative change. No evidence of fracture or dislocation. Surgical clips in the pelvis. Contrast from prior CT noticed in the bladder. IMPRESSION: Degenerative changes lumbar spine and both hips. No acute abnormality identified. No evidence of fracture or dislocation. Electronically Signed   By: Marcello Moores  Register   On: 10/29/2016 15:37   Ir Removal Of  Plural Cath W/cuff  Result Date: 10/10/2016 INDICATION: 71 year old female with a history of malignant right-sided pleural effusion and PleurX catheter placed 10/07/2016. She presents today for malfunctioning catheter. EXAM: IR REMOVAL OF PLEURAL CATH WITH CUFF MEDICATIONS: The patient is currently admitted to the hospital and receiving intravenous antibiotics. The antibiotics were administered within an appropriate time frame prior to the initiation of the procedure. ANESTHESIA/SEDATION: None COMPLICATIONS: None PROCEDURE: Informed written consent was obtained from the patient after a thorough discussion of the procedural risks, benefits and alternatives. All questions were addressed. Maximal Sterile Barrier Technique was utilized including caps, mask, sterile gowns, sterile gloves, sterile drape, hand hygiene and skin antiseptic. A timeout was performed prior to the initiation of the procedure. Patient was positioned supine position on the fluoroscopy table. 1% lidocaine was used for local anesthesia. Spot images of the chest were performed. Contrast injection was performed of the catheter. Glidewire was advanced through the catheter in attempt to reposition. Retention suture was ligated in order to Dermabond the skin. Catheter was damaged during the manipulation requiring attempt at exchange. Stiff wire was advanced through the catheter which was removed from the stiff wire. Combination of the peel-away sheath, Kumpe catheter were used in order to stiff in the tract an allow passage of the new catheter over the wiring catheter combination. Exchange of the catheter failed, with inability to passed through the soft tissue tract and enter the pleura. Catheter was removed and dressing was placed. Patient tolerated the procedure well and remained hemodynamically stable throughout. No complications were encountered and no significant blood loss encountered. FINDINGS: Initial image demonstrates catheter coiled at the  base of the lung, potentially subpulmonic. Contrast injection demonstrates contrast accumulating within the pleural is a space. Wire manipulation failed to manipulate the catheter to more superior position. Images demonstrate failed catheter exchange given the damaged catheter. IMPRESSION: Status post  failed attempt at exchange of a tunneled right chest PleurX catheter. Signed, Dulcy Fanny. Earleen Newport, DO [facility] Radiology PLAN: The patient will return for a scheduled visit at the [facility] with the chest x-ray to determine need for new PleurX catheter placement versus potential repeat thoracentesis preceding her oncology plan. Electronically Signed   By: Corrie Mckusick D.O.   On: 10/10/2016 16:52    Microbiology: Recent Results (from the past 240 hour(s))  Urine culture     Status: Abnormal   Collection Time: 10/20/2016  4:29 PM  Result Value Ref Range Status   Specimen Description URINE, RANDOM  Final   Special Requests NONE  Final   Culture (A)  Final    <10,000 COLONIES/mL INSIGNIFICANT GROWTH Performed at [facility]    Report Status 10/29/2016 FINAL  Final  Culture, blood (routine x 2)     Status: None   Collection Time: 10/29/16  9:08 AM  Result Value Ref Range Status   Specimen Description BLOOD RIGHT ARM  Final   Special Requests BOTTLES DRAWN AEROBIC AND ANAEROBIC 5CC  Final   Culture   Final    NO GROWTH 5 DAYS Performed at [facility]    Report Status 11/03/2016 FINAL  Final  Culture, blood (routine x 2)     Status: None   Collection Time: 10/29/16  9:11 AM  Result Value Ref Range Status   Specimen Description BLOOD RIGHT HAND  Final   Special Requests BOTTLES DRAWN AEROBIC AND ANAEROBIC 5CC  Final   Culture   Final    NO GROWTH 5 DAYS Performed at [facility]    Report Status 11/03/2016 FINAL  Final     Labs: Basic Metabolic Panel:  Recent Labs Lab 11/02/16 0306 11/03/16 0516 11/04/16 0515  11/05/16 0333  NA 142 142 145 148*  K 2.6* 2.9* 3.1* 3.3*  CL 92* 98* 99* 101  CO2 37* 34* 34* 35*  GLUCOSE 172* 134* 158* 184*  BUN 13 19 27* 44*  CREATININE 0.45 0.46 0.43* 0.70  CALCIUM 8.5* 9.0 9.1 9.3  MG 1.7  --  1.9  --    Liver Function Tests:  Recent Labs Lab 11/05/16 0331  ALBUMIN 2.5*   No results for input(s): LIPASE, AMYLASE in the last 168 hours. No results for input(s): AMMONIA in the last 168 hours. CBC:  Recent Labs Lab 11/01/16 0314 11/02/16 0306 11/03/16 0517 11/04/16 0515 11/05/16 0333  WBC 14.5* 11.6* 17.3* 15.9* 18.2*  HGB 9.4* 9.8* 11.0* 10.5* 10.7*  HCT 30.4* 32.2* 36.1 35.5* 37.2  MCV 85.4 84.7 85.7 86.0 87.9  PLT 363 320 261 201 200   Cardiac Enzymes: No results for input(s): CKTOTAL, CKMB, CKMBINDEX, TROPONINI in the last 168 hours. D-Dimer No results for input(s): DDIMER in the last 72 hours. BNP: Invalid input(s): POCBNP CBG: No results for input(s): GLUCAP in the last 168 hours. Anemia work up No results for input(s): VITAMINB12, FOLATE, FERRITIN, TIBC, IRON, RETICCTPCT in the last 72 hours. Urinalysis    Component Value Date/Time   COLORURINE AMBER (A) 10/29/2016 0805   APPEARANCEUR CLOUDY (A) 10/29/2016 0805   LABSPEC 1.021 10/29/2016 0805   PHURINE 5.0 10/29/2016 0805   GLUCOSEU NEGATIVE 10/29/2016 0805   HGBUR NEGATIVE 10/29/2016 0805   BILIRUBINUR NEGATIVE 10/29/2016 0805   KETONESUR NEGATIVE 10/29/2016 0805   PROTEINUR 30 (A) 10/29/2016 0805   UROBILINOGEN 0.2 04/30/2011 1340   NITRITE NEGATIVE 10/29/2016 0805   LEUKOCYTESUR  SMALL (A) 10/29/2016 0805   Sepsis Labs Invalid input(s): PROCALCITONIN,  WBC,  LACTICIDVEN     SIGNED:  Annita Brod, MD  [facility] 2016/11/18, 5:18 PM Pager   If 7PM-7AM, please contact night-coverage [email or web address] Password [email or web address]

## 2016-12-05 NOTE — Progress Notes (Signed)
[place] met pt's sister, niece and family friend shortly after pt passed. Pt's sister was very sad, but seemed at peace. She found solace in the fact that she was holding her hand when here sister quickly slipped away. Pt talked about their growing up together and how close they were. Pt's niece was appropriately grieving and supportive of her mother along with the family friend. CH gave information to call when they have selected a funeral home. [place] remained w/family until their departure. Chaplain Ernest Haber, M.Div.   November 19, 2016 1700  Clinical Encounter Type  Visited With Family

## 2016-12-05 DEATH — deceased

## 2018-09-25 IMAGING — CT CT ANGIO CHEST
2 of 7 series · 18 of 46 positions shown · IV contrast (ISOVUE 370)
Comparison: None.

CLINICAL DATA: Subacute onset of right-sided chest pain. Increasing
respiratory distress. Leukocytosis and elevated D-dimer. Initial
encounter.

EXAM:
CT ANGIOGRAPHY CHEST WITH CONTRAST
TECHNIQUE: Multidetector CT imaging of the chest was performed using the
standard protocol during bolus administration of intravenous
contrast. Multiplanar CT image reconstructions and MIPs were
obtained to evaluate the vascular anatomy.
CONTRAST:  100 mL of Isovue 370 IV contrast

[Series 5: thins · axial · 0.67mm/px · z∈[-538,-262]mm · 16 of 311 slices shown]
[im 17/311  lung]
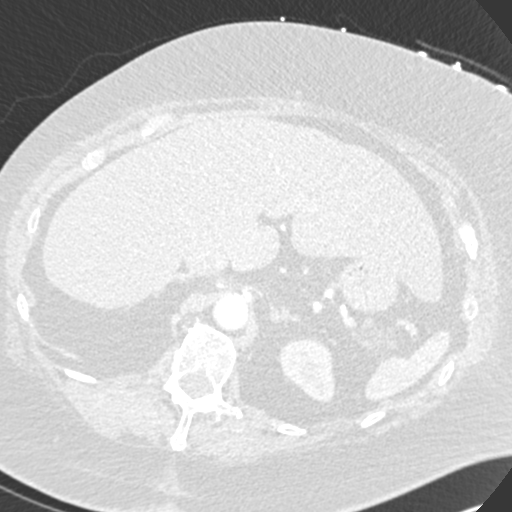
[im 33/311  soft-tissue]
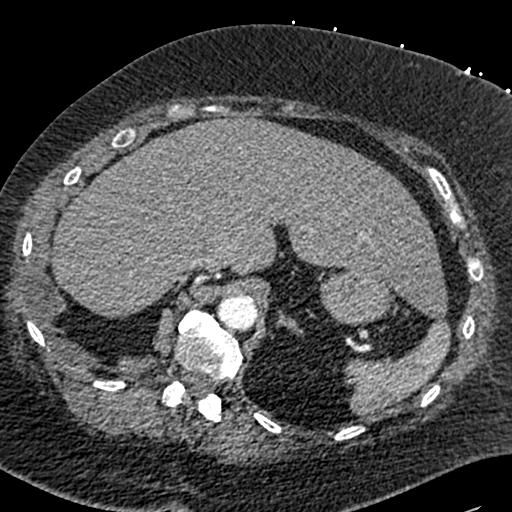
[im 49/311  lung]
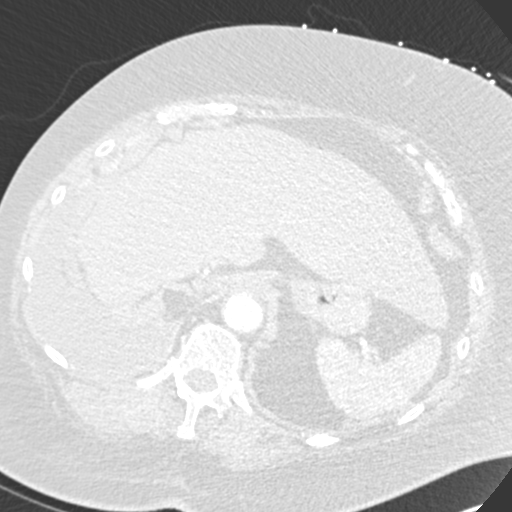
[im 66/311  soft-tissue]
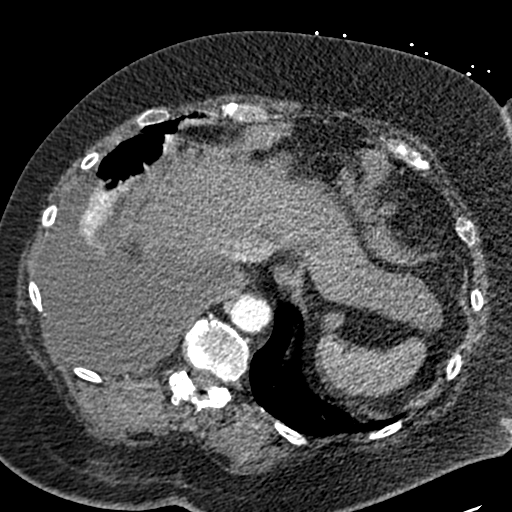
[im 98/311  lung]
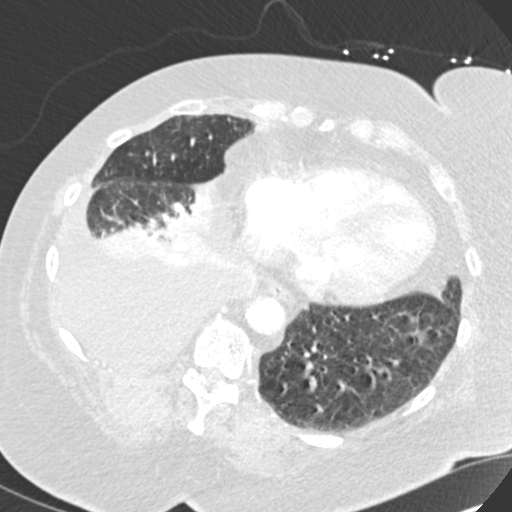
[im 115/311  soft-tissue]
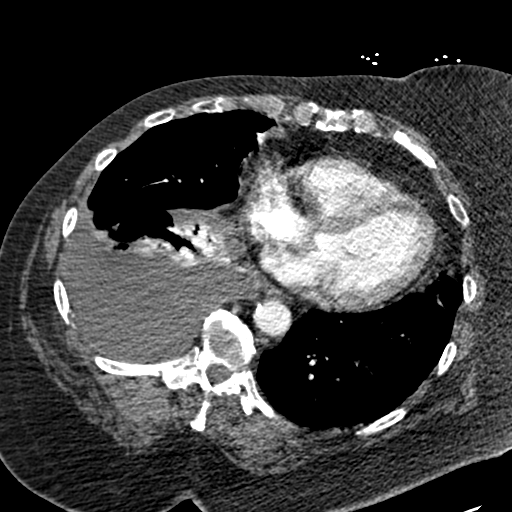
[im 131/311  lung]
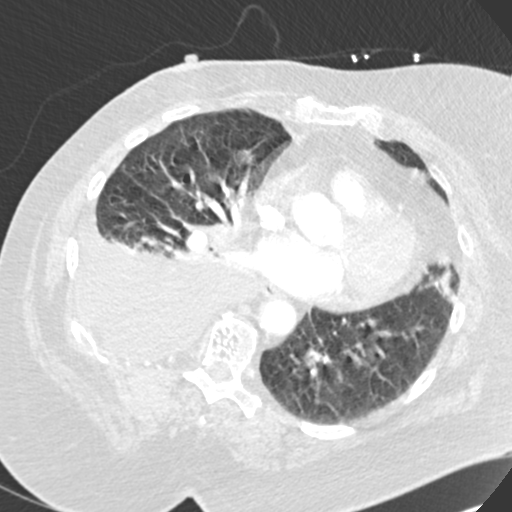
[im 147/311  soft-tissue]
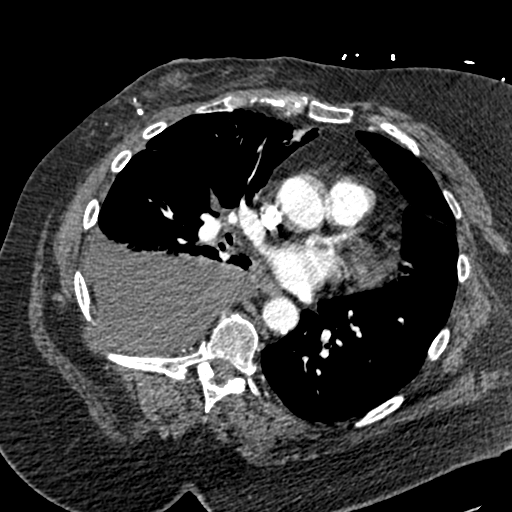
[im 164/311  lung]
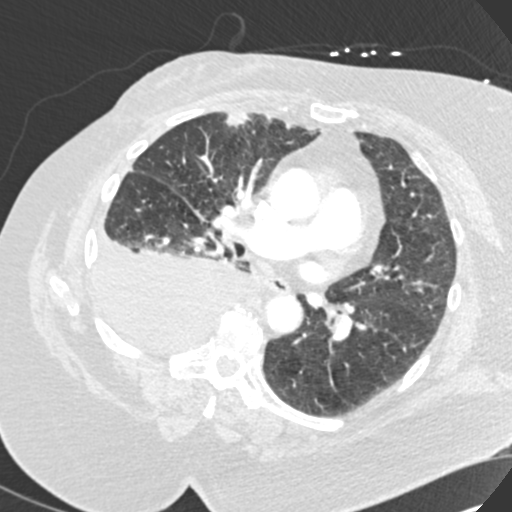
[im 180/311  soft-tissue]
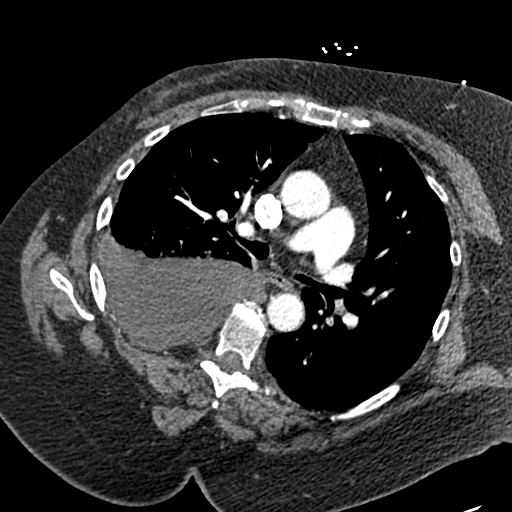
[im 196/311  lung]
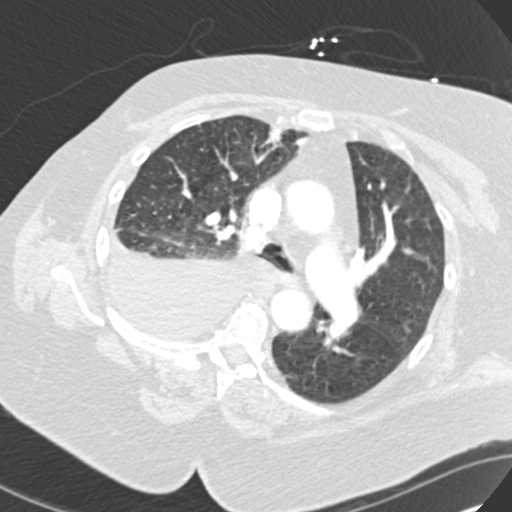
[im 213/311  soft-tissue]
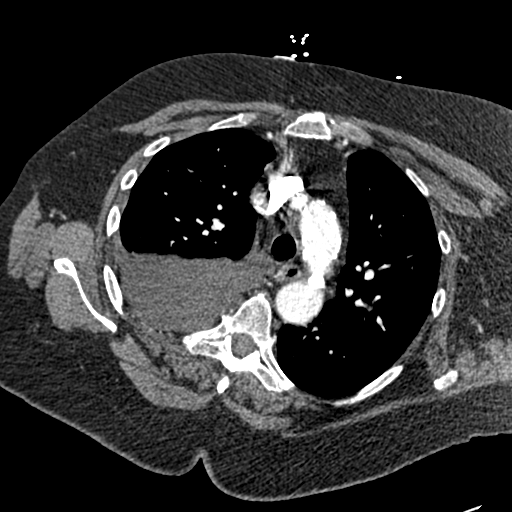
[im 245/311  lung]
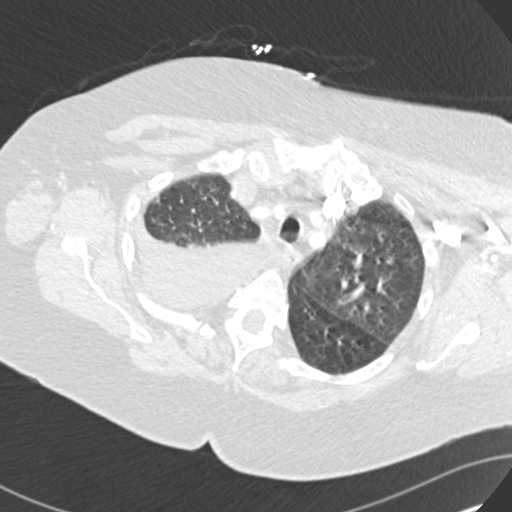
[im 262/311  soft-tissue]
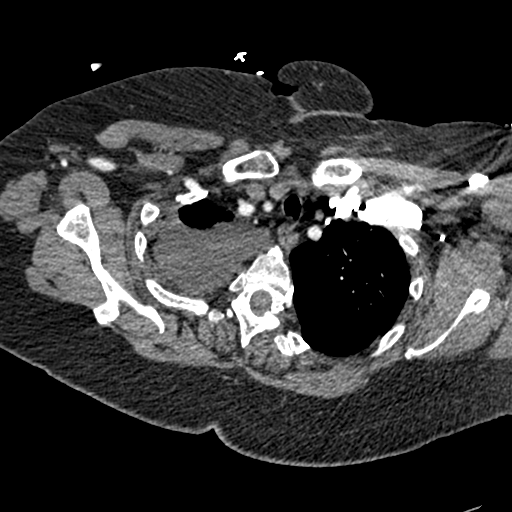
[im 278/311  lung]
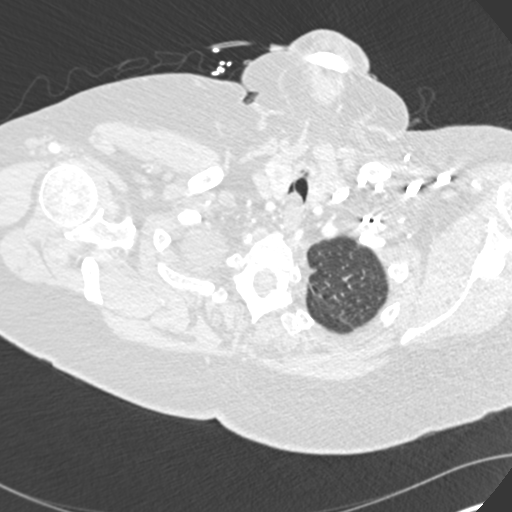
[im 294/311  soft-tissue]
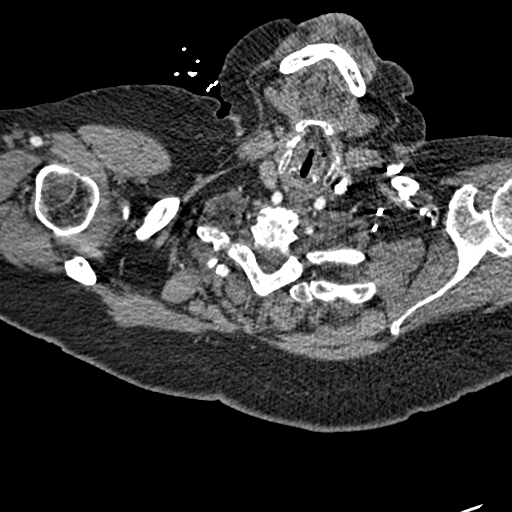

[Series 7: coronal mpr · coronal · 0.61mm/px · 2 of 94 slices shown]
[im 32/94  soft-tissue]
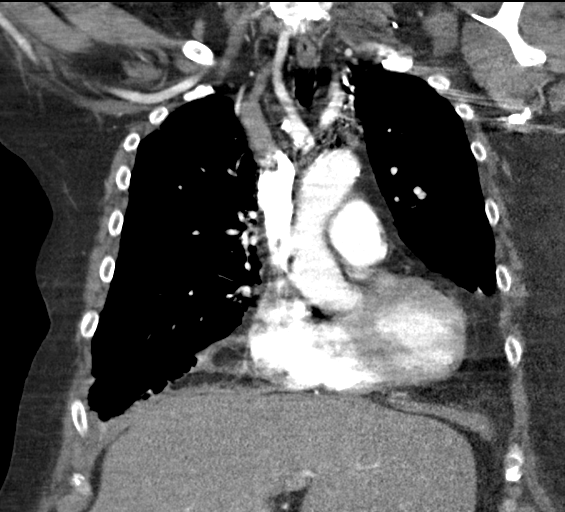
[im 63/94  soft-tissue]
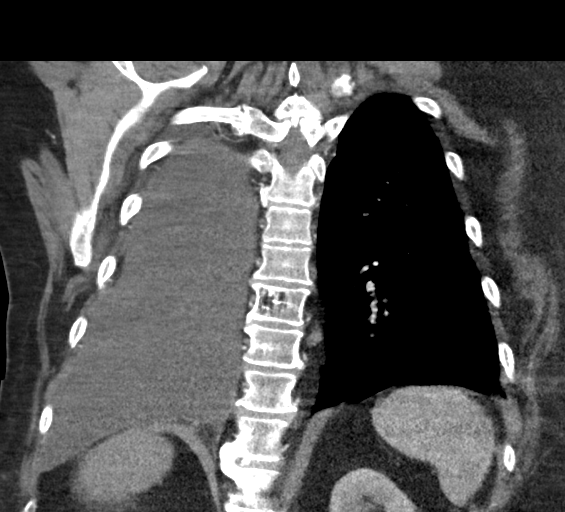

[18 of 46 positions shown; findings below may reference images not displayed]

FINDINGS: Cardiovascular: There is no evidence of significant pulmonary
embolus. Evaluation for pulmonary embolus is suboptimal in areas of
airspace consolidation and due to motion artifact.

Scattered calcification is noted along the aortic arch and
descending thoracic aorta. The heart remains borderline normal in
size. Scattered coronary artery calcifications are seen. The great
vessels are unremarkable in appearance.

Mediastinum/Nodes: Visualized mediastinal nodes remain normal in
size. No pericardial effusion is identified. Bilateral
hypoattenuating nodules are noted within the thyroid gland,
measuring up to 1.7 cm on the left. No axillary lymphadenopathy is
seen. The patient is status post right-sided mastectomy.

Lungs/Pleura: A moderate right-sided pleural effusion is noted, with
partial consolidation at the right lung base. There is a 1.7 x
cm pleural-based nodule anteriorly at the right middle lobe (image
49 of 95). Malignancy cannot be excluded. No pneumothorax is seen.

Upper Abdomen: The visualized portions of the liver and spleen are
grossly unremarkable. The visualized portions of the adrenal glands
are within normal limits.

Musculoskeletal: No acute osseous abnormalities are identified. An
hemangioma is noted at vertebral body T9. The visualized musculature
is unremarkable in appearance.

Review of the MIP images confirms the above findings.
IMPRESSION: 1. No evidence of significant pulmonary embolus.
2. Moderate right-sided pleural effusion, with partial consolidation
at the right lung base. This could reflect pneumonia.
3. Apparent 1.7 x 1.0 cm pleural-based nodule anteriorly at the
right middle lobe. Malignancy cannot be excluded. PET/CT would be
helpful for further evaluation. Alternatively, diagnostic
thoracentesis might be considered.
4. **An incidental finding of potential clinical significance has
been found. 1.7 cm hypoattenuating nodule at the left thyroid lobe.
Consider further evaluation with thyroid ultrasound. If patient is
clinically hyperthyroid, consider nuclear medicine thyroid uptake
and scan.**
5. Scattered coronary artery calcifications seen.
6. Vertebral body hemangioma noted at T9.
